# Patient Record
Sex: Female | Born: 1955
Health system: Southern US, Community
[De-identification: ages and names within clinical notes are randomized; demographics above are authoritative.]

## PROBLEM LIST (undated history)

## (undated) DIAGNOSIS — H8309 Labyrinthitis, unspecified ear: Secondary | ICD-10-CM

## (undated) DIAGNOSIS — I639 Cerebral infarction, unspecified: Secondary | ICD-10-CM

## (undated) DIAGNOSIS — Z8541 Personal history of malignant neoplasm of cervix uteri: Secondary | ICD-10-CM

## (undated) DIAGNOSIS — IMO0002 Reserved for concepts with insufficient information to code with codable children: Secondary | ICD-10-CM

## (undated) DIAGNOSIS — E785 Hyperlipidemia, unspecified: Secondary | ICD-10-CM

## (undated) DIAGNOSIS — R87629 Unspecified abnormal cytological findings in specimens from vagina: Secondary | ICD-10-CM

## (undated) DIAGNOSIS — I10 Essential (primary) hypertension: Secondary | ICD-10-CM

## (undated) DIAGNOSIS — R87619 Unspecified abnormal cytological findings in specimens from cervix uteri: Secondary | ICD-10-CM

## (undated) HISTORY — DX: Reserved for concepts with insufficient information to code with codable children: IMO0002

## (undated) HISTORY — DX: Labyrinthitis, unspecified ear: H83.09

## (undated) HISTORY — DX: Cerebral infarction, unspecified: I63.9

## (undated) HISTORY — DX: Personal history of malignant neoplasm of cervix uteri: Z85.41

## (undated) HISTORY — DX: Unspecified abnormal cytological findings in specimens from cervix uteri: R87.619

## (undated) HISTORY — PX: ABDOMINAL HYSTERECTOMY: SHX81

## (undated) HISTORY — DX: Hyperlipidemia, unspecified: E78.5

## (undated) HISTORY — DX: Unspecified abnormal cytological findings in specimens from vagina: R87.629

## (undated) HISTORY — DX: Essential (primary) hypertension: I10

---

## 2001-10-20 ENCOUNTER — Encounter: Payer: Self-pay | Admitting: Specialist

## 2001-10-20 ENCOUNTER — Ambulatory Visit (HOSPITAL_COMMUNITY): Admission: RE | Admit: 2001-10-20 | Discharge: 2001-10-20 | Payer: Self-pay | Admitting: Specialist

## 2002-10-24 ENCOUNTER — Ambulatory Visit (HOSPITAL_COMMUNITY): Admission: RE | Admit: 2002-10-24 | Discharge: 2002-10-24 | Payer: Self-pay | Admitting: Specialist

## 2002-10-24 ENCOUNTER — Encounter: Payer: Self-pay | Admitting: Specialist

## 2003-11-14 ENCOUNTER — Ambulatory Visit (HOSPITAL_COMMUNITY): Admission: RE | Admit: 2003-11-14 | Discharge: 2003-11-14 | Payer: Self-pay | Admitting: Specialist

## 2005-05-05 ENCOUNTER — Ambulatory Visit (HOSPITAL_COMMUNITY): Admission: RE | Admit: 2005-05-05 | Discharge: 2005-05-05 | Payer: Self-pay | Admitting: Specialist

## 2006-07-16 ENCOUNTER — Ambulatory Visit (HOSPITAL_COMMUNITY): Admission: RE | Admit: 2006-07-16 | Discharge: 2006-07-16 | Payer: Self-pay | Admitting: Specialist

## 2007-09-29 ENCOUNTER — Other Ambulatory Visit: Admission: RE | Admit: 2007-09-29 | Discharge: 2007-09-29 | Payer: Self-pay | Admitting: Obstetrics and Gynecology

## 2007-09-29 ENCOUNTER — Ambulatory Visit (HOSPITAL_COMMUNITY): Admission: RE | Admit: 2007-09-29 | Discharge: 2007-09-29 | Payer: Self-pay | Admitting: Obstetrics and Gynecology

## 2007-12-13 ENCOUNTER — Ambulatory Visit (HOSPITAL_COMMUNITY): Admission: RE | Admit: 2007-12-13 | Discharge: 2007-12-13 | Payer: Self-pay | Admitting: General Surgery

## 2008-09-12 ENCOUNTER — Ambulatory Visit: Payer: Self-pay | Admitting: Orthopedic Surgery

## 2008-09-12 DIAGNOSIS — M25579 Pain in unspecified ankle and joints of unspecified foot: Secondary | ICD-10-CM

## 2008-09-13 ENCOUNTER — Encounter: Payer: Self-pay | Admitting: Orthopedic Surgery

## 2008-10-04 ENCOUNTER — Other Ambulatory Visit: Admission: RE | Admit: 2008-10-04 | Discharge: 2008-10-04 | Payer: Self-pay | Admitting: Obstetrics and Gynecology

## 2008-10-05 ENCOUNTER — Ambulatory Visit (HOSPITAL_COMMUNITY): Admission: RE | Admit: 2008-10-05 | Discharge: 2008-10-05 | Payer: Self-pay | Admitting: Obstetrics and Gynecology

## 2008-10-05 IMAGING — MG MM DIGITAL SCREENING
4 series · 4 of 4 positions shown · non-contrast
Comparison: none

DG SCREEN MAMMOGRAM BILATERAL
Bilateral CC and MLO view(s) were taken.

DIGITAL SCREENING MAMMOGRAM WITH CAD:
There are scattered fibroglandular densities.  No masses or malignant type calcifications are 
identified.  Compared with prior studies.

[L CC]
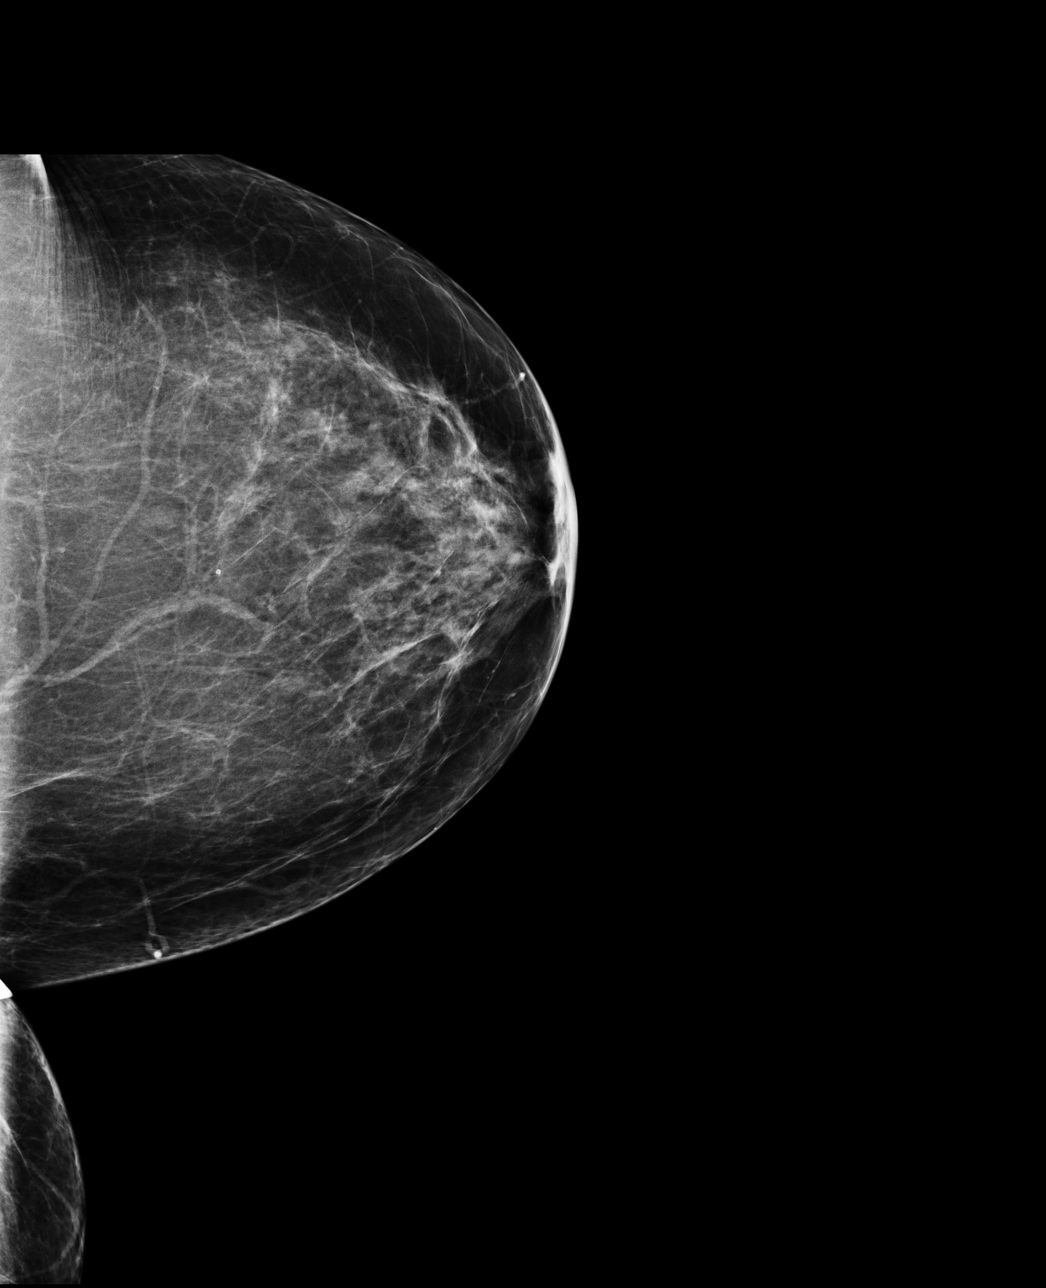

[L MLO]
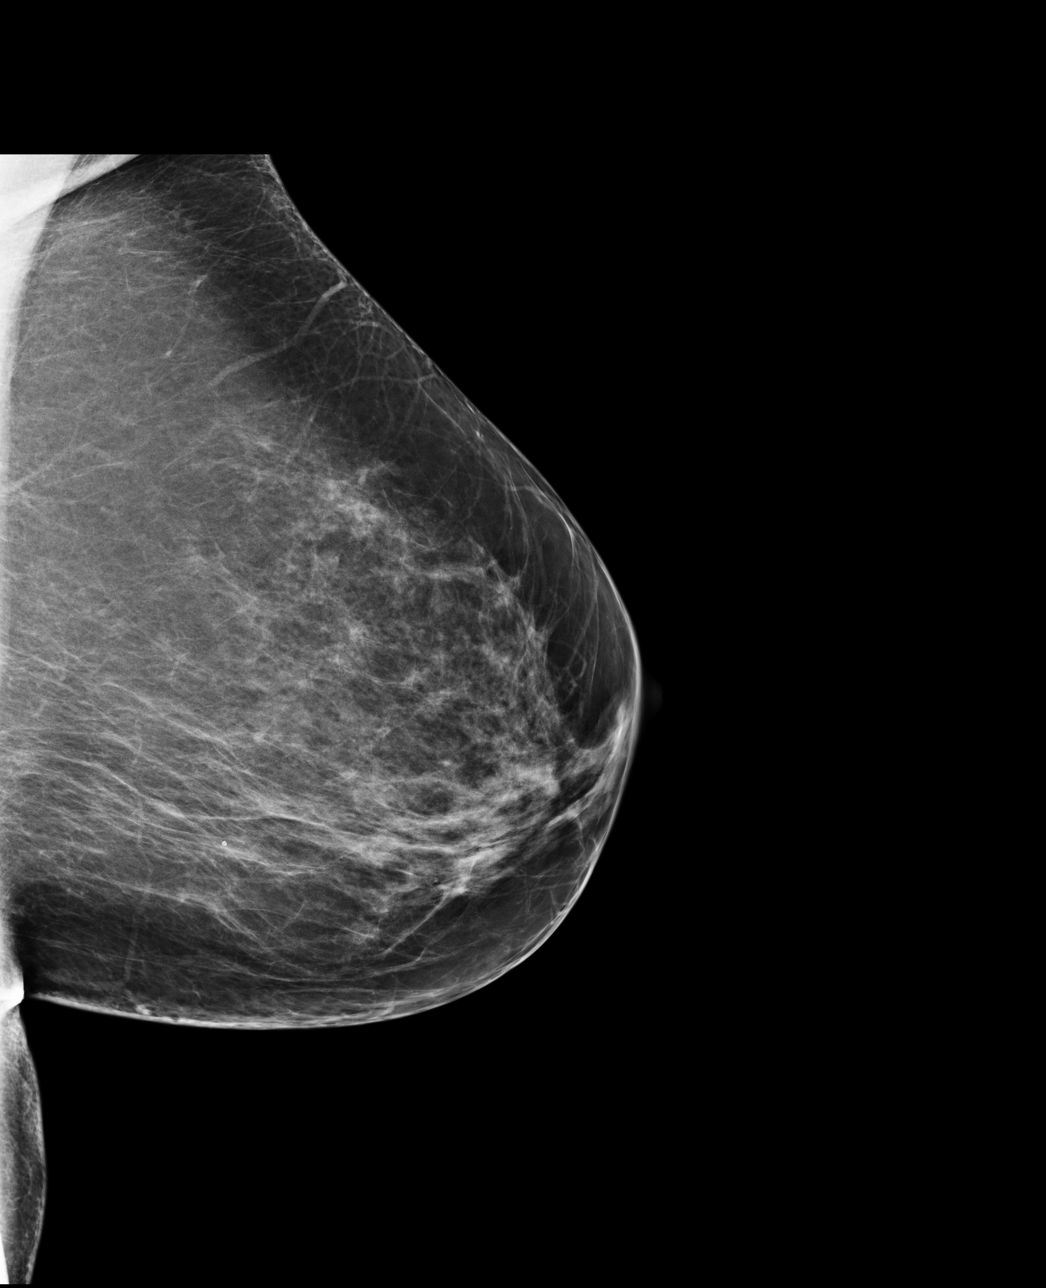

[R CC]
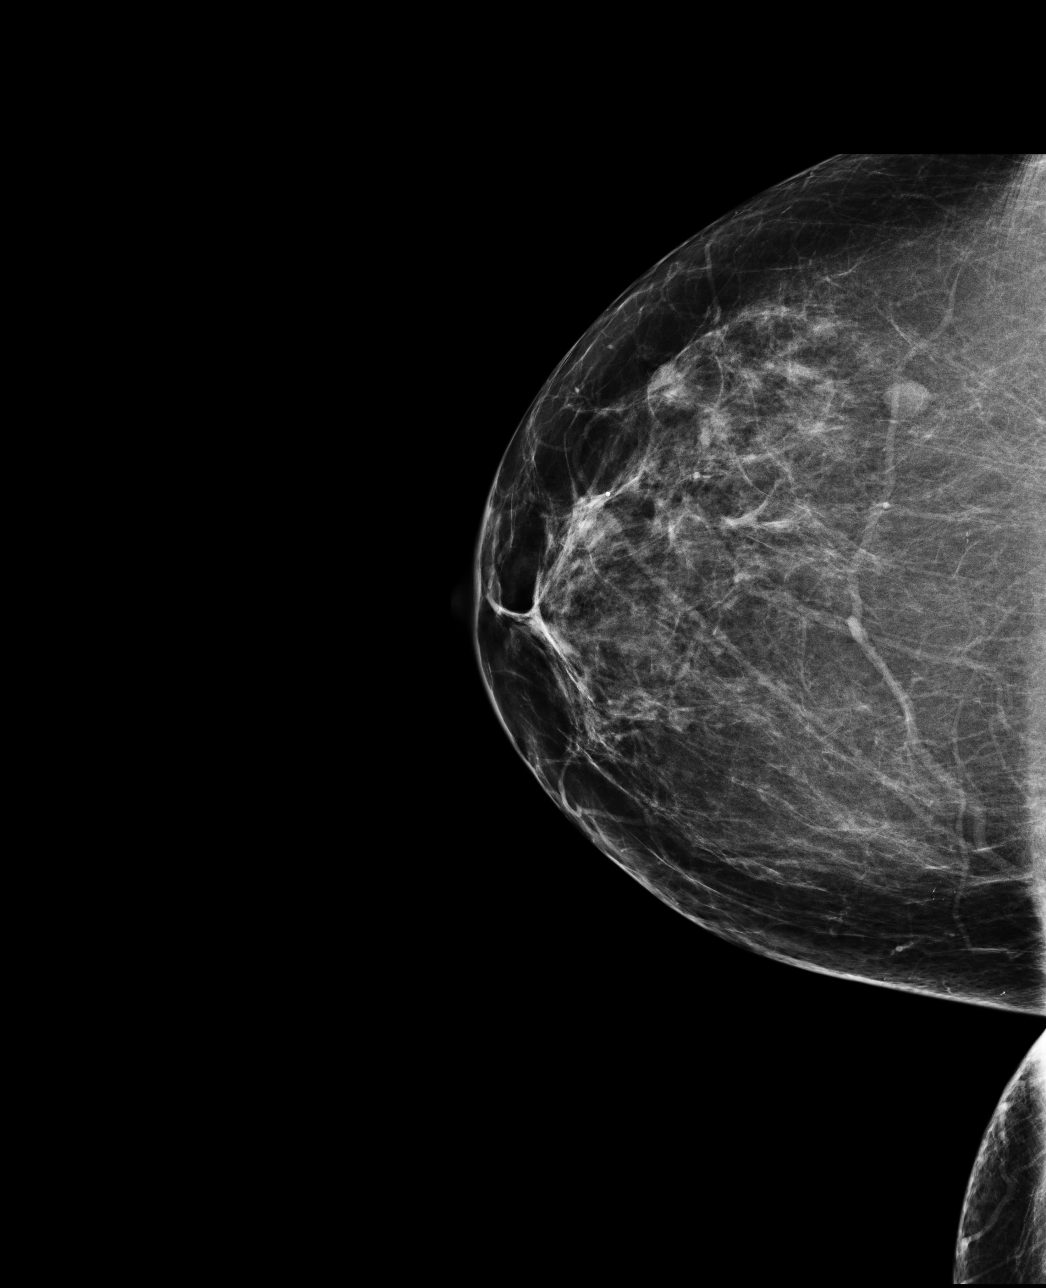

[R MLO]
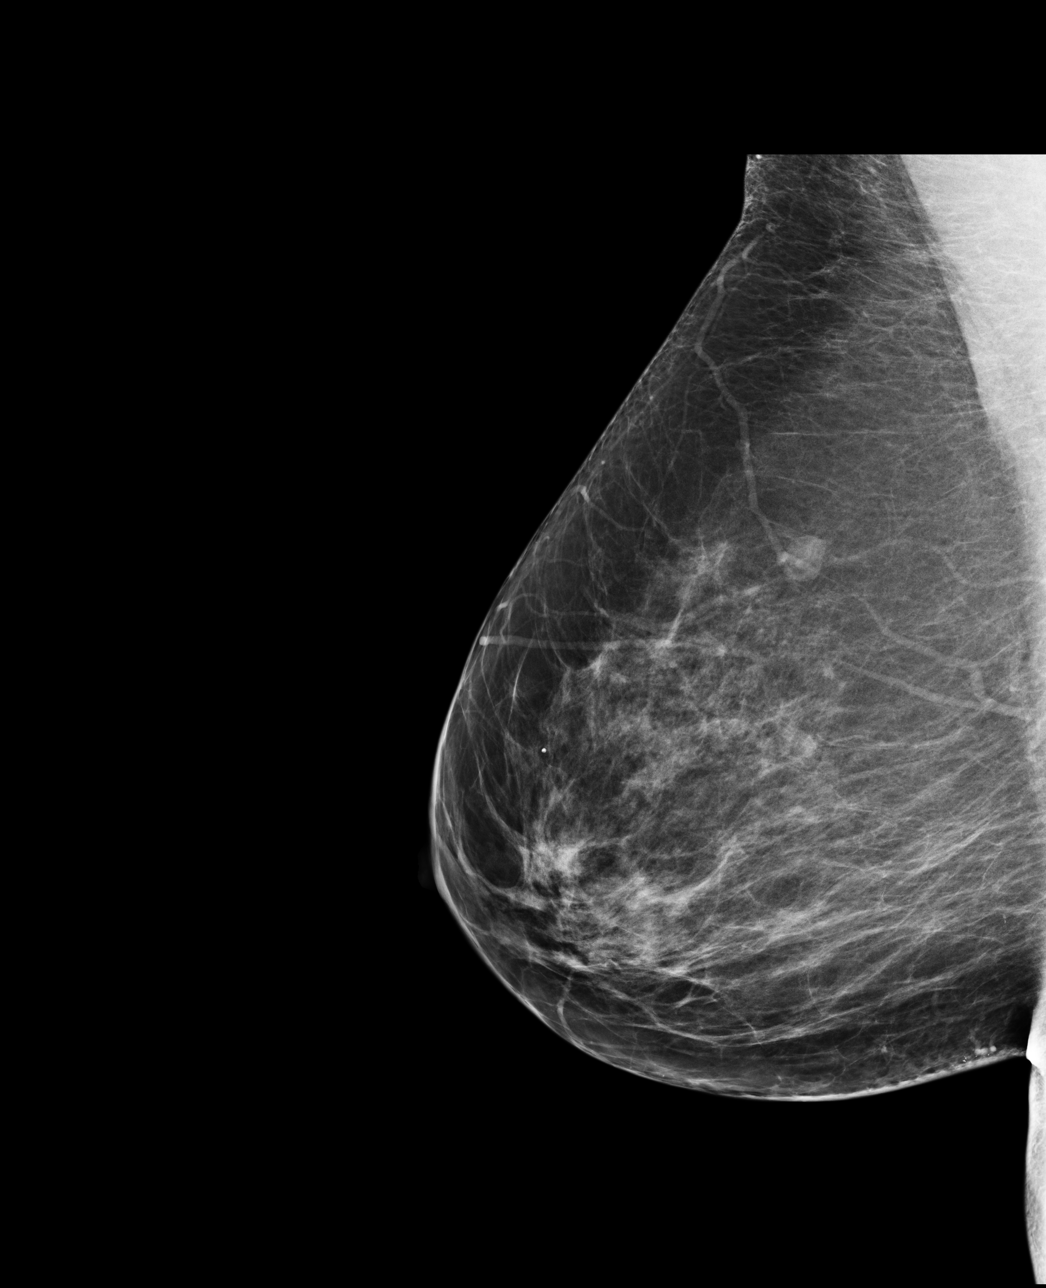

[4 of 4 positions shown; findings below may reference images not displayed]

IMPRESSION: No specific mammographic evidence of malignancy.  Next screening mammogram is recommended in one 
year.

A result letter of this screening mammogram will be mailed directly to the patient.

ASSESSMENT: Negative - BI-RADS 1

Screening mammogram in 1 year.
THIS WAS ANALAYZED BY COMPUTER AIDED DETECTION. , THIS PROCEDURE WAS A DIGITAL MAMMOGRAM.

## 2009-11-22 ENCOUNTER — Ambulatory Visit (HOSPITAL_COMMUNITY): Admission: RE | Admit: 2009-11-22 | Discharge: 2009-11-22 | Payer: Self-pay | Admitting: Obstetrics & Gynecology

## 2009-11-22 ENCOUNTER — Other Ambulatory Visit: Admission: RE | Admit: 2009-11-22 | Discharge: 2009-11-22 | Payer: Self-pay | Admitting: Obstetrics and Gynecology

## 2009-11-22 IMAGING — MG MM DIGITAL SCREENING
5 series · 5 of 5 positions shown · non-contrast
Comparison: none

DG SCREEN MAMMOGRAM BILATERAL
Bilateral CC and MLO view(s) were taken.
Technologist: [REDACTED]

DIGITAL SCREENING MAMMOGRAM WITH CAD:
There are scattered fibroglandular densities.  No masses or malignant type calcifications are 
identified.  Compared with prior studies.
Images were processed with CAD.

[L CC (1 of 2)]
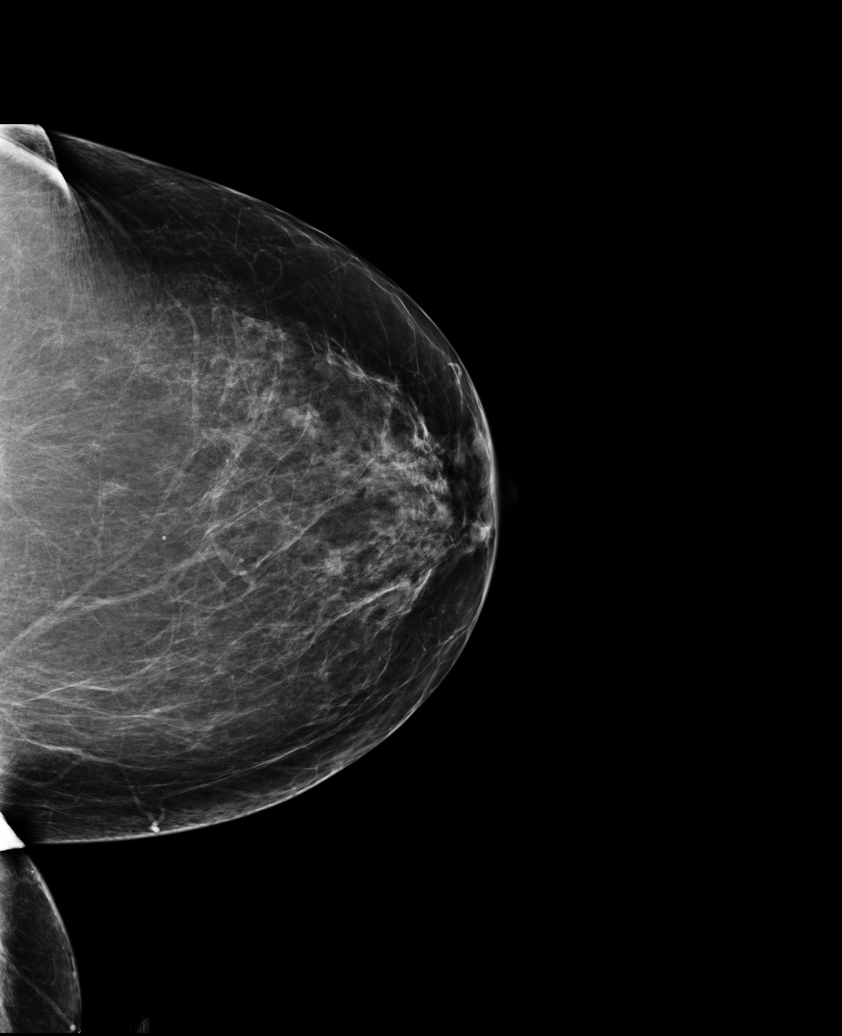

[L MLO]
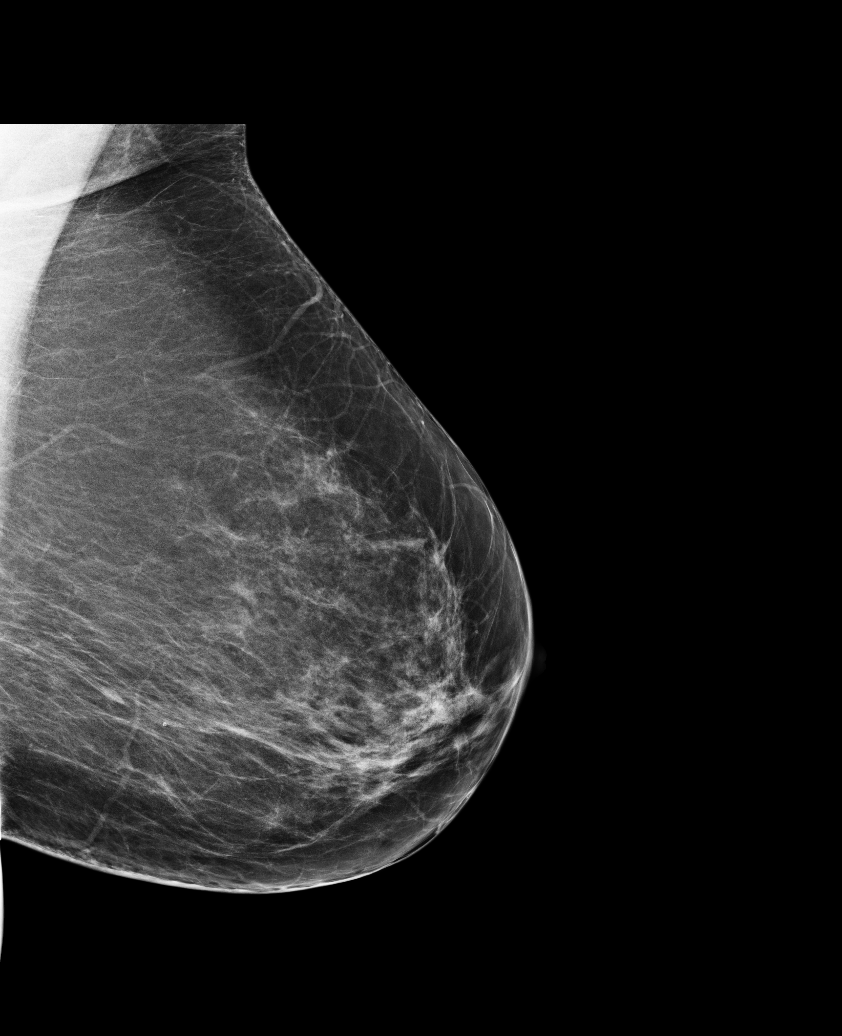

[R CC]
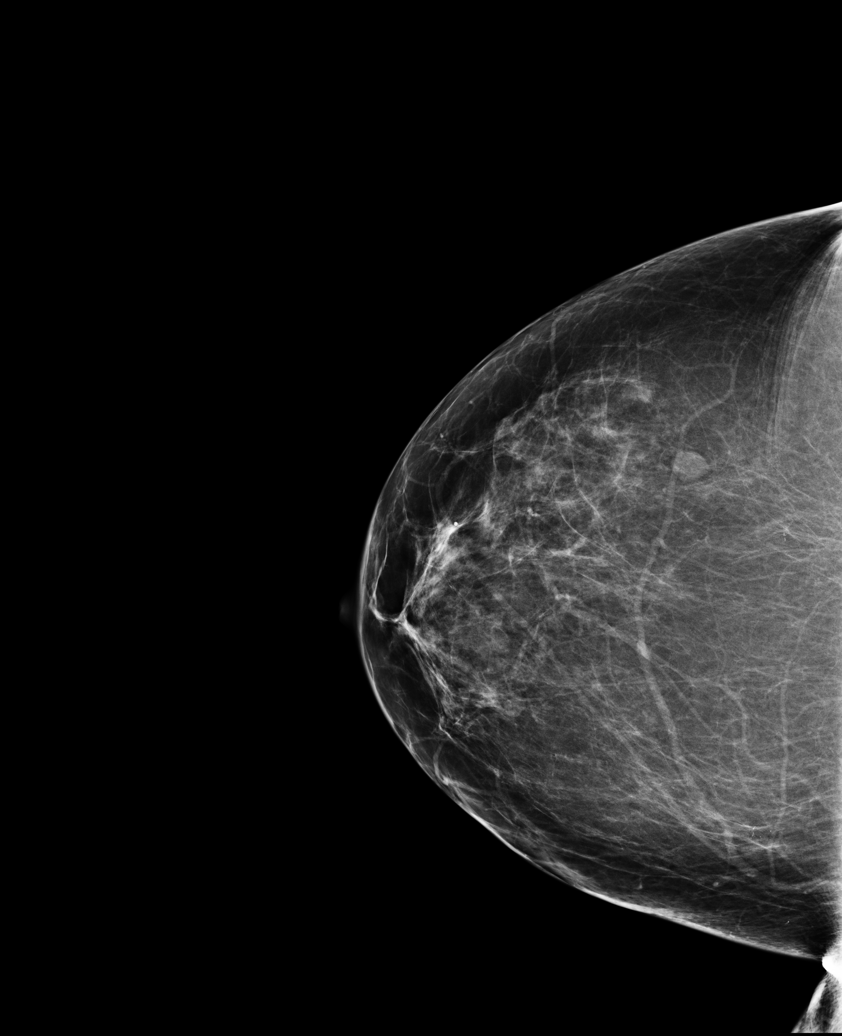

[R MLO]
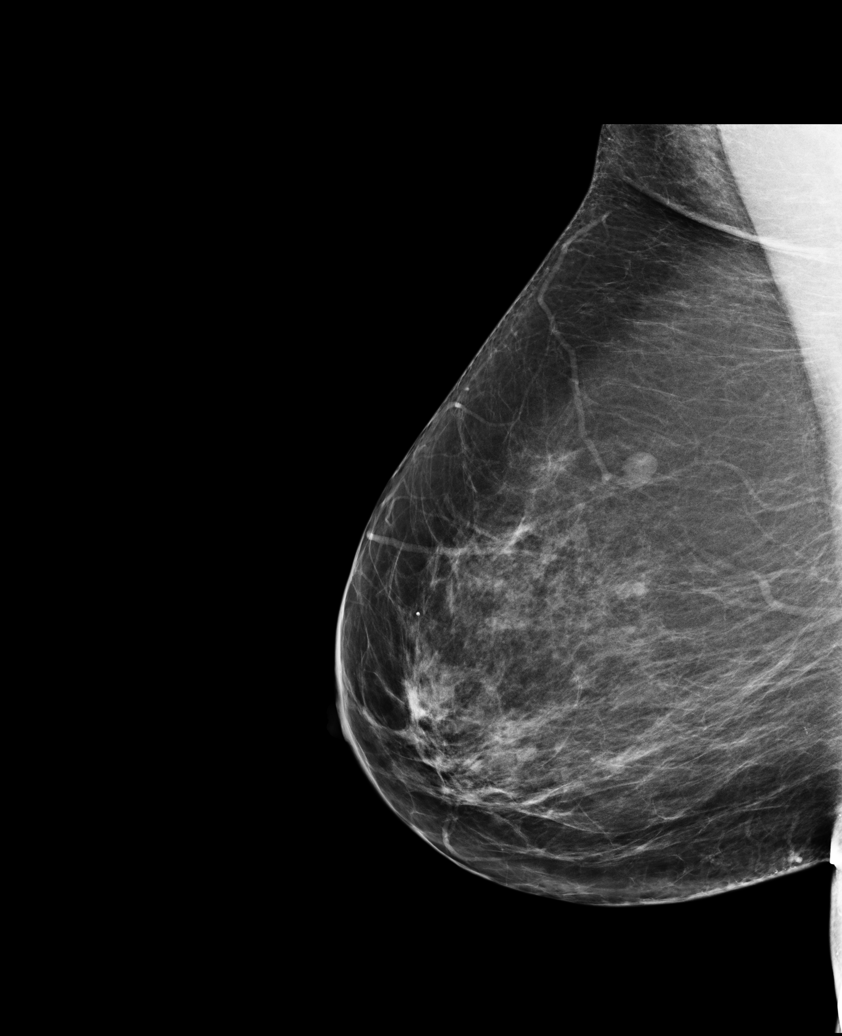

[L CC (2 of 2)]
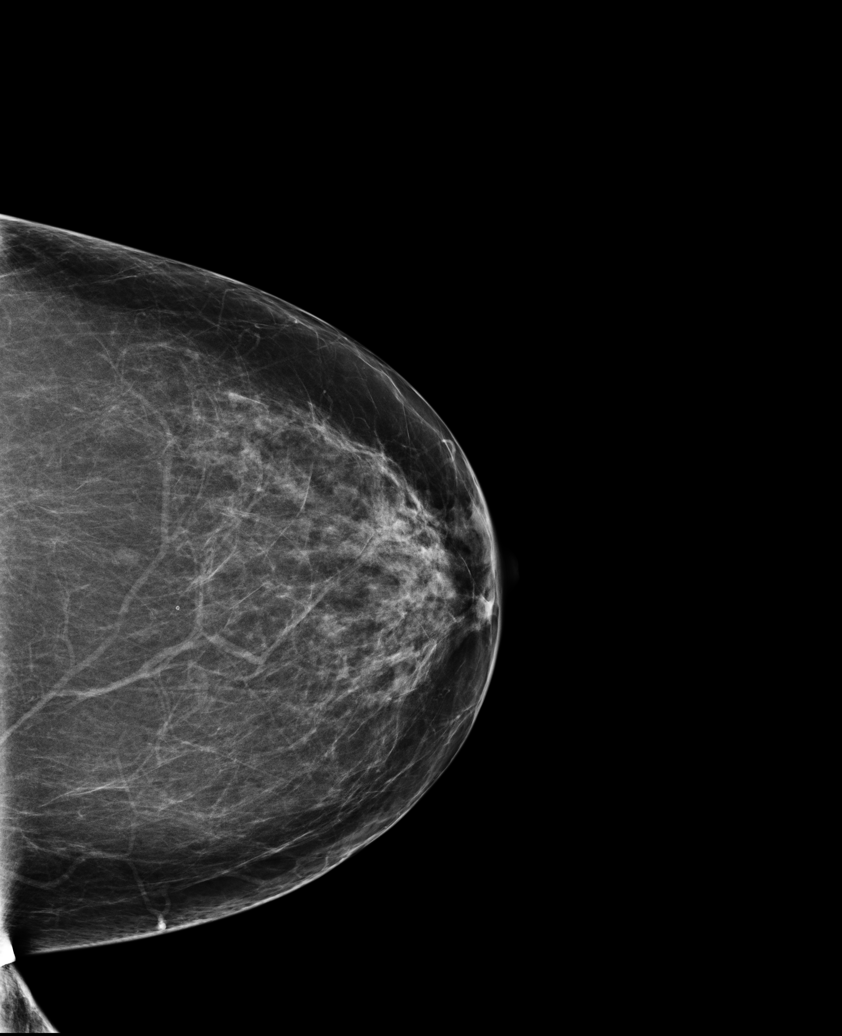

[5 of 5 positions shown; findings below may reference images not displayed]

IMPRESSION: No specific mammographic evidence of malignancy.  Next screening mammogram is recommended in one 
year.

A result letter of this screening mammogram will be mailed directly to the patient.

ASSESSMENT: Negative - BI-RADS 1

Screening mammogram in 1 year.
,

## 2010-07-22 ENCOUNTER — Ambulatory Visit: Payer: Self-pay | Admitting: Orthopedic Surgery

## 2010-09-04 NOTE — Assessment & Plan Note (Signed)
Summary: rt knee pain needs xr/uhc/bsf   Visit Type:  new problem  CC:  right knee pain.  History of Present Illness: I saw Jacqueline Leon in the office today for an initial visit.  She is a 55 years old woman with the complaint of:  right knee pain  She has had swelling in the RIGHT knee with pain for the last 2 weeks increased with knee flexion described as dull throbbing occasionally relieved by Aleve medication.  Pain intensity is 5/10.  Swelling tends to recur the end of the day.  No trauma.  She's been rubbing it with BenGay.  She has start up pain and stiffness.  Medications: D3 5000 International Units, Simvastatin 20 mg, Meclizine 25 mg.  Xrays today.  Allergies: No Known Drug Allergies  Past History:  Past Medical History: Last updated: 09/12/2008 cholesterol inner ear  Past Surgical History: Last updated: 09/12/2008 partial hysterectomy  Family History: Last updated: 09/12/2008 FH of Cancer:   Social History: Last updated: 09/12/2008 Patient is married.  textiles  Risk Factors: Caffeine Use: 0 (09/12/2008)  Risk Factors: Smoking Status: never (09/12/2008)  Family History: Reviewed history from 09/12/2008 and no changes required. FH of Cancer:   Social History: Reviewed history from 09/12/2008 and no changes required. Patient is married.  textiles  Review of Systems Constitutional:  Complains of weight gain; denies weight loss, fever, chills, and fatigue. Cardiovascular:  Denies chest pain, palpitations, fainting, and murmurs. Respiratory:  Denies short of breath, wheezing, couch, tightness, pain on inspiration, and snoring . Gastrointestinal:  Denies heartburn, nausea, vomiting, diarrhea, constipation, and blood in your stools. Genitourinary:  Denies frequency, urgency, difficulty urinating, painful urination, flank pain, and bleeding in urine. Neurologic:  Denies numbness, tingling, unsteady gait, dizziness, tremors, and seizure. Musculoskeletal:   Denies joint pain, swelling, instability, stiffness, redness, heat, and muscle pain. Endocrine:  Denies excessive thirst, exessive urination, and heat or cold intolerance. Psychiatric:  Denies nervousness, depression, anxiety, and hallucinations. Skin:  Denies changes in the skin, poor healing, rash, itching, and redness. HEENT:  Denies blurred or double vision, eye pain, redness, and watering. Immunology:  Denies seasonal allergies, sinus problems, and allergic to bee stings. Hemoatologic:  Denies easy bleeding and brusing.   Patient Instructions: 1)  This is most likely a cartilage tear  2)  You have received an injection of cortisone today. You may experience increased pain at the injection site. Apply ice pack to the area for 20 minutes every 2 hours and take 2 xtra strength tylenol every 8 hours. This increased pain will usually resolve in 24 hours. The injection will take effect in 3-10 days.  3)  Aleve two times a day  4)  f/u 6 weeks   Appended Document: rt knee pain needs xr/uhc/bsf Physical Exam  General:  Well developed, well nourished, normal body habitus; no deformities, normal grooming. Msk:  her LEFT knee examination reveals no specific abnormalities.  Inspection was normal.  Range of motion was full.  Strength assessed 5 over 5.  Knee was stable.  Her RIGHT knee with painful at terminal flexion had no effusion he did have significant medial joint line tenderness.  Full extension was noted.  Strength is graded 5 over 5 in her knee was stable  Pocket of tests of McMurray's were positive. Pulses:  The pulses and perfusion were normal with normal color, temperature  and no swelling  Extremities:   The upper extremities have normal appearance, ROM, strength and stability.  Neurologic:  The  coordination and sensation were normal  The reflexes were normal   Skin:  intact without lesions or rashes Inguinal Nodes:  no significant adenopathy Psych:  alert and cooperative;  normal mood and affect; normal attention span and concentration   Impression & Recommendations:  Problem # 1:  DERANGEMENT OF POSTERIOR HORN OF MEDIAL MENISCUS (ICD-717.2) Assessment New  3 views RIGHT knee  Overall the knee looks well aligned maybe a little varus, the joint spaces appear to be preserved there were no bone lesions  Impression normal RIGHT knee  Overall impression probable torn medial meniscus  I offered the patient injection vs. surgical treatment  Patient opted for nonsurgical treatment so we gave her an injection in the RIGHT knee  Verbal consent was obtained. The RIGHT knee was prepped with alcohol and ethyl chloride. 1 cc of depomedrol 40mg /cc and 4 cc of lidocaine 1% was injected. there were no complications.  Orders: New Patient Level III (04540) Knee x-ray,  3 views (98119) Joint Aspirate / Injection, Large (20610) Depo- Medrol 40mg  (J1030)  Patient Instructions: 1)  MRI [facility] follow up with results   Orders Added: 1)  New Patient Level III [14782] 2)  Knee x-ray,  3 views [73562] 3)  Joint Aspirate / Injection, Large [20610] 4)  Depo- Medrol 40mg  [J1030]    Signed by Fuller Canada MD on 07/23/2010 at 7:59 AM

## 2010-09-04 NOTE — Letter (Signed)
Summary: History form  History form   Imported By: Jacklynn Ganong 07/30/2010 15:42:45  _____________________________________________________________________  External Attachment:    Type:   Image     Comment:   External Document

## 2010-09-09 ENCOUNTER — Encounter: Payer: Self-pay | Admitting: Orthopedic Surgery

## 2010-09-09 ENCOUNTER — Ambulatory Visit (INDEPENDENT_AMBULATORY_CARE_PROVIDER_SITE_OTHER): Payer: 59 | Admitting: Orthopedic Surgery

## 2010-09-09 DIAGNOSIS — M23329 Other meniscus derangements, posterior horn of medial meniscus, unspecified knee: Secondary | ICD-10-CM

## 2010-09-09 DIAGNOSIS — M1710 Unilateral primary osteoarthritis, unspecified knee: Secondary | ICD-10-CM

## 2010-09-18 NOTE — Assessment & Plan Note (Signed)
Summary: 6 wk folup rt knee ?xray/uhc/wkj   Visit Type:  Follow-up  CC:  right knee pain.  History of Present Illness: I saw Renad Scioneaux in the office today for a 6 week  followup visit.  She is a 55 years old woman with the complaint of:  right knee  DX: POSSIBLE MEDIAL MENISCUS TEAR VS. OA   Medications: D3 5000 International Units, Simvastatin 20 mg, Meclizine 25 mg, Aleve.  Treatment: Injection on last visit and Aleve.  Complaints: She states that her knee feels alot better.       Allergies: No Known Drug Allergies  Physical Exam  Skin:  intact without lesions or rashes Psych:  alert and cooperative; normal mood and affect; normal attention span and concentration   Knee Exam  Knee Exam:    Right:    Inspection:  Normal    Palpation:  Normal    Stability:  stable    Tenderness:  no    Swelling:  no    Erythema:  no    MCMURRAYS IS NEGATIVE     Range of Motion:       Flexion-Active: full       Extension-Active: full       Flexion-Passive: full       Extension-Passive: full    Left:    Inspection:  Normal    Palpation:  Normal    Stability:  stable    Tenderness:  no    Swelling:  no    Erythema:  no    Range of Motion:       Flexion-Active: full       Extension-Active: full       Flexion-Passive: full       Extension-Passive: full   Impression & Recommendations:  Problem # 1:  DERANGEMENT OF POSTERIOR HORN OF MEDIAL MENISCUS (ICD-717.2) Assessment Improved  Orders: Est. Patient Level II (16010)  Problem # 2:  DEGENERATIVE JOINT DISEASE, RIGHT KNEE (ICD-715.96) Assessment: Improved  Orders: Est. Patient Level II (93235)  Patient Instructions: 1)  CONTINUE ICE AND ALEVE AS NEEDED AND CALL ME IF THINGS WORSEN AGAIN    Orders Added: 1)  Est. Patient Level II [57322]

## 2010-12-10 ENCOUNTER — Other Ambulatory Visit: Payer: Self-pay | Admitting: Obstetrics and Gynecology

## 2010-12-10 DIAGNOSIS — Z139 Encounter for screening, unspecified: Secondary | ICD-10-CM

## 2010-12-16 NOTE — H&P (Signed)
NAME:  Jacqueline Leon, KHURANA                ACCOUNT NO.:  [id number]   MEDICAL RECORD NO.:  [id number]          PATIENT TYPE:  AMB   LOCATION:  DAY                           FACILITY:  [facility]   PHYSICIAN:  Dalia Heading, M.D.  DATE OF BIRTH:  [birth year]   DATE OF ADMISSION:  DATE OF DISCHARGE:  LH                              HISTORY & PHYSICAL   CHIEF COMPLAINT:  Need for screening colonoscopy.   HISTORY OF PRESENT ILLNESS:  The patient is a 55 year old black female  who is referred for endoscopic evaluation.  She needs a colonoscopy for  screening purposes.  No abdominal pain, weight loss, nausea, vomiting,  diarrhea, constipation, melena, or hematochezia have been noted.  She  has never had a colonoscopy.  There is no family history of colon  carcinoma.   PAST MEDICAL HISTORY:  Includes high cholesterol levels.   PAST SURGICAL HISTORY:  Partial hysterectomy.   CURRENT MEDICATIONS:  Cholesterol medications.   ALLERGIES:  No known drug allergies.   REVIEW OF SYSTEMS:  The patient does smoke tobacco daily.  She denies  any significant alcohol use.  She denies any other cardiopulmonary  difficulties or bleeding disorders.   PHYSICAL EXAMINATION:  GENERAL:  The patient is a well-developed, well-  nourished black female in no acute distress.  LUNGS:  Clear to auscultation with equal breath sounds bilaterally.  HEART:  Examination reveals a regular rate and rhythm without S3-S4, or  murmurs.  ABDOMEN:  Soft, nontender, nondistended.  No hepatosplenomegaly or  masses are noted.  RECTAL:  Examination was deferred to the procedure.   IMPRESSION:  Need for screening colonoscopy.   PLAN:  The patient is scheduled for a colonoscopy on November 08, 2007.  The  risks and benefits of the procedure; including bleeding and perforation  were fully explained to the patient, who gave informed consent.      Dalia Heading, M.D.     MAJ/MEDQ  D:  10/18/2007  T:  10/19/2007  Job:  [id number]   cc:    [facility]  Fax: [phone]   Patrica Duel, M.D.  Fax: [phone]   Tilda Burrow, M.D.  Fax: [phone]

## 2010-12-16 NOTE — H&P (Signed)
NAME:  Jacqueline Leon, Jacqueline Leon                ACCOUNT NO.:  [id number]   MEDICAL RECORD NO.:  [id number]          PATIENT TYPE:  AMB   LOCATION:  DAY                           FACILITY:  [facility]   PHYSICIAN:  Dalia Heading, M.D.  DATE OF BIRTH:  Nov 22, 1955   DATE OF ADMISSION:  DATE OF DISCHARGE:  LH                              HISTORY & PHYSICAL   CHIEF COMPLAINT:  Need for screening colonoscopy.   HISTORY OF PRESENT ILLNESS:  The patient is a 55 year old black female  who is referred for endoscopic evaluation.  She needs a colonoscopy for  screening purposes.  No abdominal pain, weight loss, nausea, vomiting,  diarrhea, constipation, melena, or hematochezia have been noted.  She  has never had a colonoscopy.  There is no family history of colon  carcinoma.   PAST MEDICAL HISTORY:  Includes high cholesterol levels.   PAST SURGICAL HISTORY:  Partial hysterectomy.   CURRENT MEDICATIONS:  Cholesterol medications.   ALLERGIES:  No known drug allergies.   REVIEW OF SYSTEMS:  The patient does smoke tobacco daily.  She denies  any significant alcohol use.  She denies any other cardiopulmonary  difficulties or bleeding disorders.   PHYSICAL EXAMINATION:  GENERAL:  The patient is a well-developed, well-  nourished black female in no acute distress.  LUNGS:  Clear to auscultation with equal breath sounds bilaterally.  HEART:  Examination reveals a regular rate and rhythm without S3-S4, or  murmurs.  ABDOMEN:  Soft, nontender, nondistended.  No hepatosplenomegaly or  masses are noted.  RECTAL:  Examination was deferred to the procedure.   IMPRESSION:  Need for screening colonoscopy.   PLAN:  The patient is scheduled for a colonoscopy on November 08, 2007.  The  risks and benefits of the procedure; including bleeding and perforation  were fully explained to the patient, who gave informed consent.      Dalia Heading, M.D.  Electronically Signed     MAJ/MEDQ  D:  10/18/2007  T:   10/19/2007  Job:  [id number]   cc:   [facility]  Fax: [phone]   Patrica Duel, M.D.  Fax: [phone]   Tilda Burrow, M.D.  Fax: [phone]

## 2011-01-26 ENCOUNTER — Ambulatory Visit (HOSPITAL_COMMUNITY)
Admission: RE | Admit: 2011-01-26 | Discharge: 2011-01-26 | Disposition: A | Discharge status: Home / Self Care | Payer: 59 | Source: Ambulatory Visit | Attending: Obstetrics and Gynecology | Admitting: Obstetrics and Gynecology

## 2011-01-26 ENCOUNTER — Other Ambulatory Visit: Payer: Self-pay | Admitting: Adult Health

## 2011-01-26 ENCOUNTER — Other Ambulatory Visit (HOSPITAL_COMMUNITY)
Admission: RE | Admit: 2011-01-26 | Discharge: 2011-01-26 | Disposition: A | Discharge status: Home / Self Care | Payer: 59 | Source: Ambulatory Visit | Attending: Obstetrics and Gynecology | Admitting: Obstetrics and Gynecology

## 2011-01-26 DIAGNOSIS — Z1231 Encounter for screening mammogram for malignant neoplasm of breast: Secondary | ICD-10-CM | POA: Insufficient documentation

## 2011-01-26 DIAGNOSIS — Z01419 Encounter for gynecological examination (general) (routine) without abnormal findings: Secondary | ICD-10-CM | POA: Insufficient documentation

## 2011-01-26 DIAGNOSIS — Z139 Encounter for screening, unspecified: Secondary | ICD-10-CM

## 2012-01-13 ENCOUNTER — Other Ambulatory Visit: Payer: Self-pay | Admitting: Adult Health

## 2012-01-13 DIAGNOSIS — Z139 Encounter for screening, unspecified: Secondary | ICD-10-CM

## 2012-03-03 ENCOUNTER — Ambulatory Visit (HOSPITAL_COMMUNITY): Payer: 59

## 2012-03-28 ENCOUNTER — Ambulatory Visit (HOSPITAL_COMMUNITY)
Admission: RE | Admit: 2012-03-28 | Discharge: 2012-03-28 | Disposition: A | Discharge status: Home / Self Care | Payer: 59 | Source: Ambulatory Visit | Attending: Adult Health | Admitting: Adult Health

## 2012-03-28 ENCOUNTER — Other Ambulatory Visit (HOSPITAL_COMMUNITY)
Admission: RE | Admit: 2012-03-28 | Discharge: 2012-03-28 | Disposition: A | Discharge status: Home / Self Care | Payer: 59 | Source: Ambulatory Visit | Attending: Obstetrics and Gynecology | Admitting: Obstetrics and Gynecology

## 2012-03-28 ENCOUNTER — Other Ambulatory Visit: Payer: Self-pay | Admitting: Adult Health

## 2012-03-28 DIAGNOSIS — Z01419 Encounter for gynecological examination (general) (routine) without abnormal findings: Secondary | ICD-10-CM | POA: Insufficient documentation

## 2012-03-28 DIAGNOSIS — Z139 Encounter for screening, unspecified: Secondary | ICD-10-CM

## 2012-03-28 DIAGNOSIS — Z1151 Encounter for screening for human papillomavirus (HPV): Secondary | ICD-10-CM | POA: Insufficient documentation

## 2012-03-28 DIAGNOSIS — Z1231 Encounter for screening mammogram for malignant neoplasm of breast: Secondary | ICD-10-CM | POA: Insufficient documentation

## 2012-03-28 IMAGING — MG MM DIGITAL SCREENING BILAT
4 series · 4 of 4 positions shown · non-contrast
Comparison: Previous exams.

CLINICAL DATA: Screening.

DIGITAL BILATERAL SCREENING MAMMOGRAM WITH CAD

[L CC]
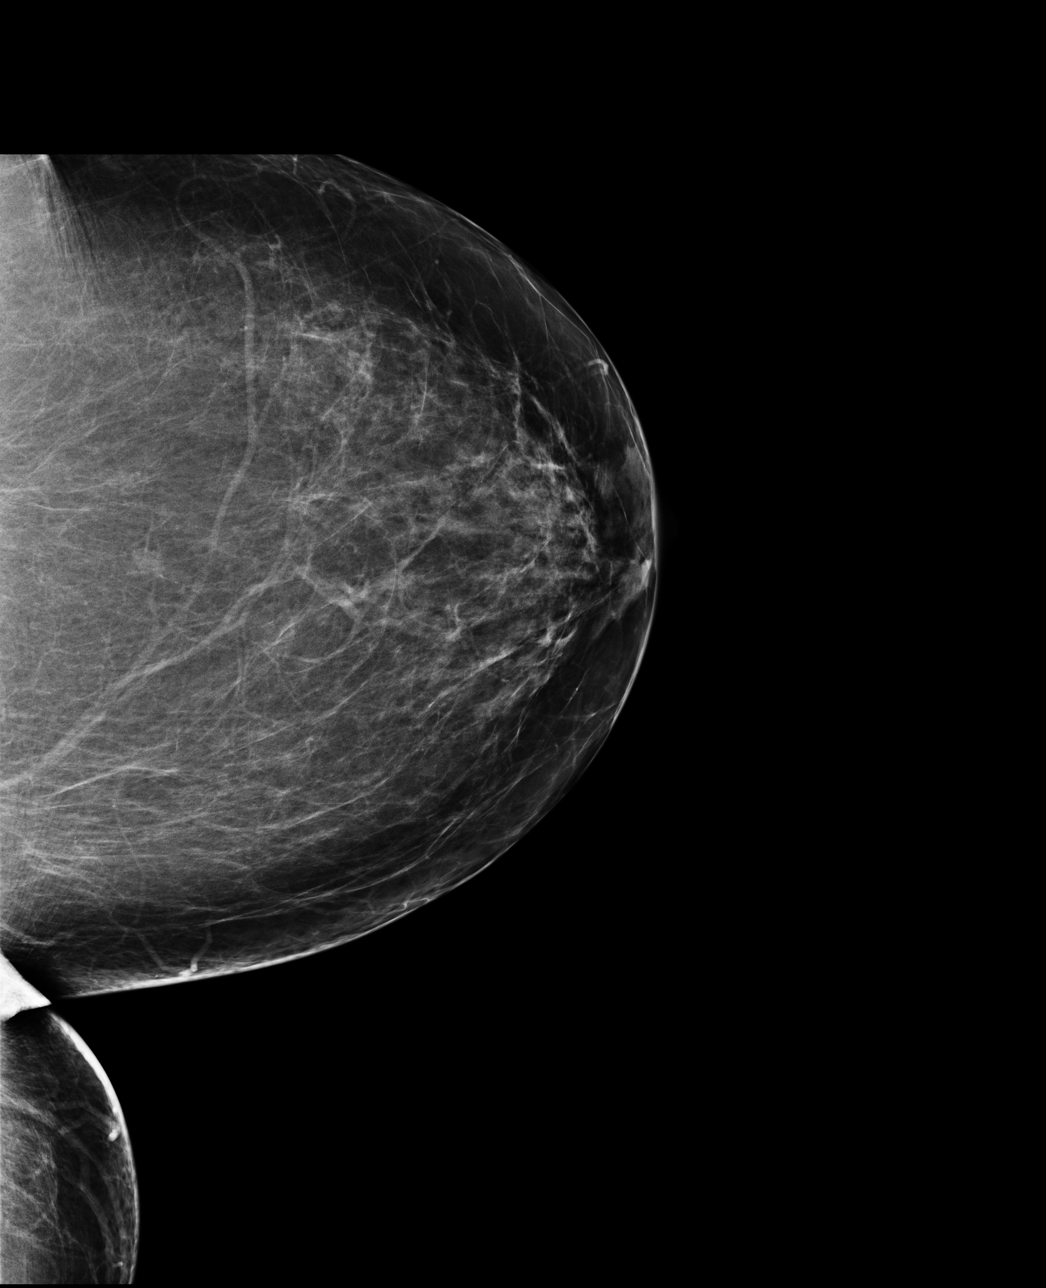

[L MLO]
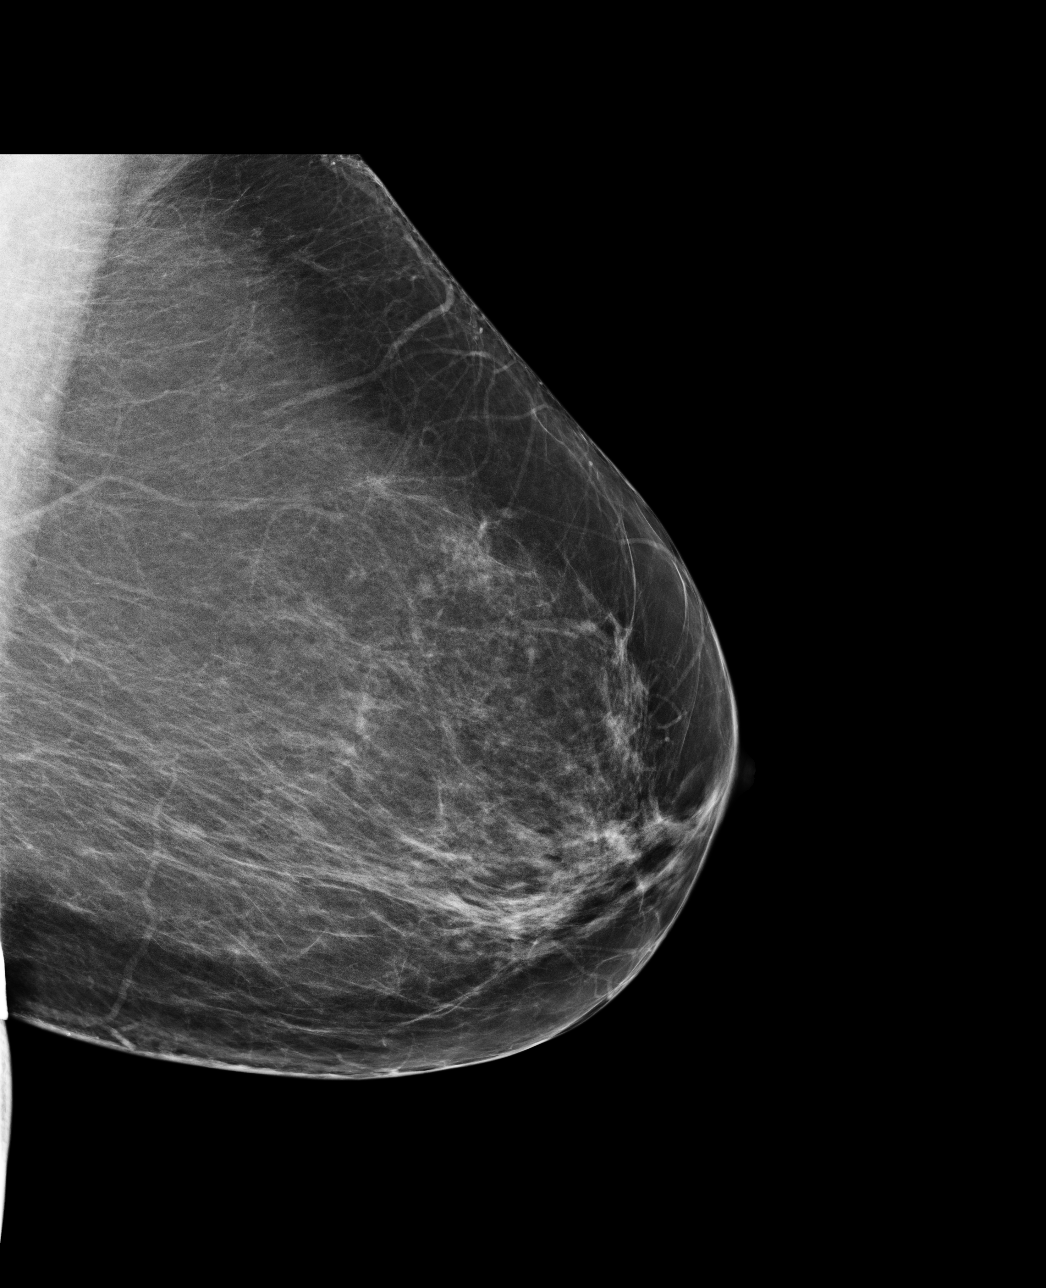

[R CC]
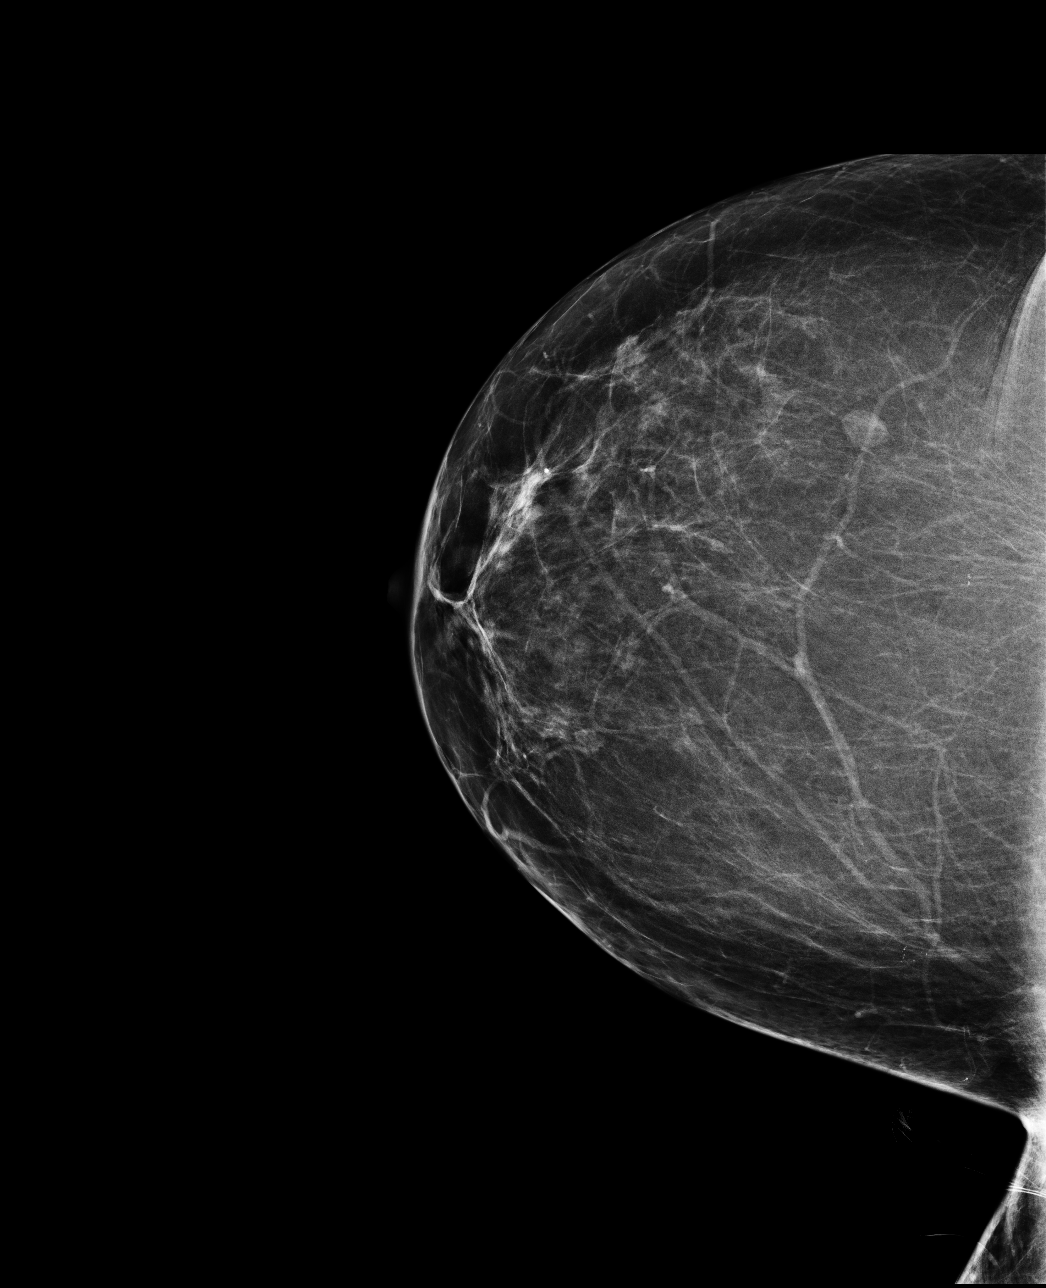

[R MLO]
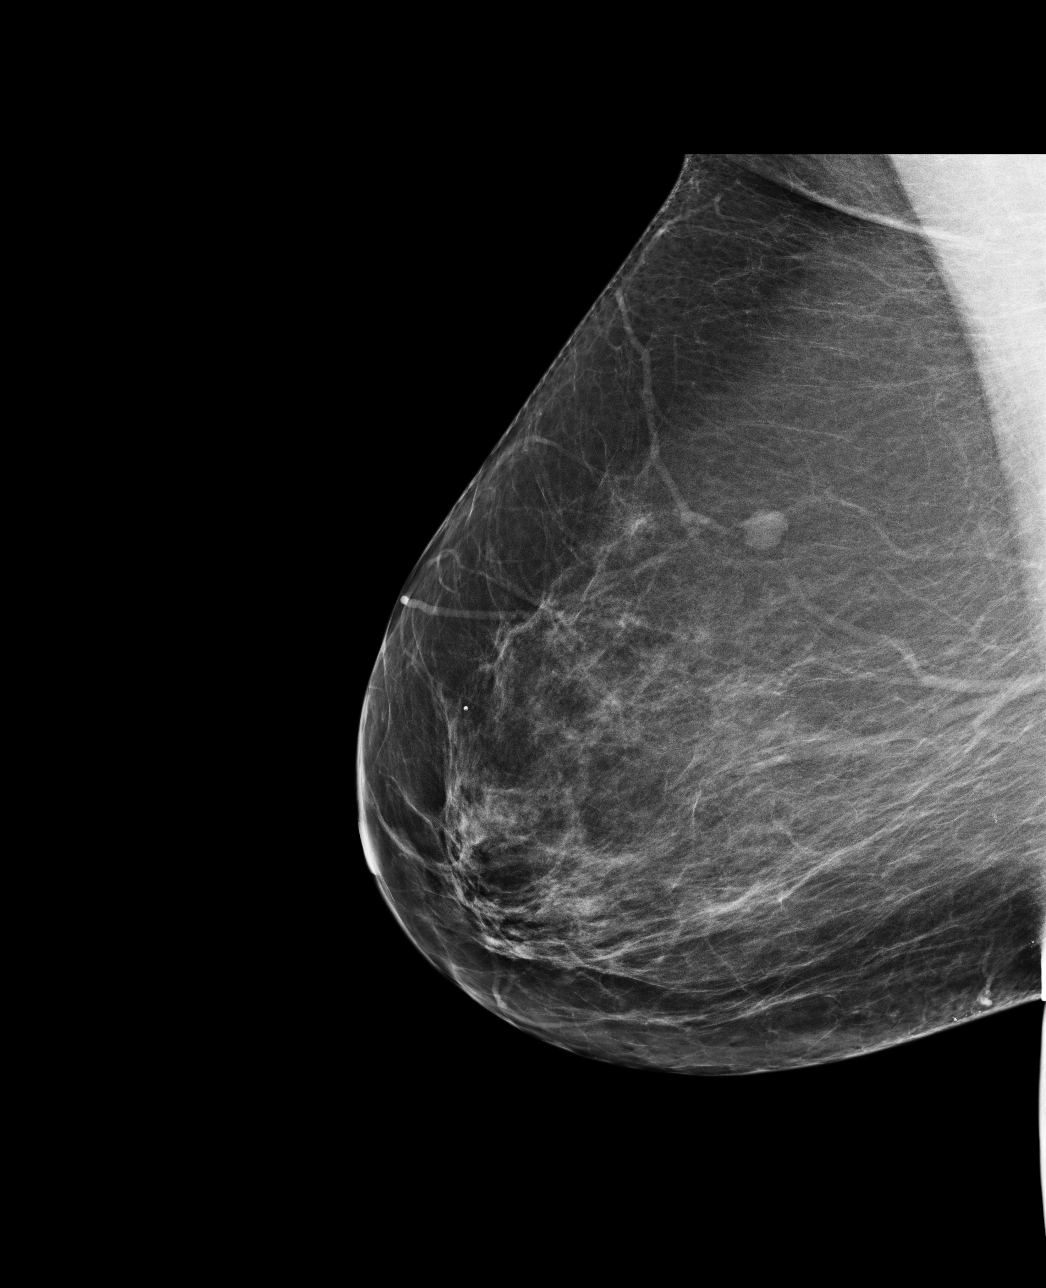

[4 of 4 positions shown; findings below may reference images not displayed]

FINDINGS: There are scattered fibroglandular densities. No
suspicious masses, architectural distortion, or calcifications are
present.

Images were processed with CAD.
IMPRESSION: No mammographic evidence of malignancy.

A result letter of this screening mammogram will be mailed directly
to the patient.

RECOMMENDATION:
Screening mammogram in one year. (Code:[XG])

BI-RADS CATEGORY 1:  Negative.

## 2013-03-23 ENCOUNTER — Other Ambulatory Visit: Payer: Self-pay | Admitting: Adult Health

## 2013-03-23 DIAGNOSIS — Z139 Encounter for screening, unspecified: Secondary | ICD-10-CM

## 2013-04-10 ENCOUNTER — Other Ambulatory Visit: Payer: Self-pay | Admitting: Adult Health

## 2013-05-19 ENCOUNTER — Ambulatory Visit (HOSPITAL_COMMUNITY): Payer: 59

## 2013-05-19 ENCOUNTER — Other Ambulatory Visit: Payer: Self-pay | Admitting: Adult Health

## 2013-06-02 ENCOUNTER — Other Ambulatory Visit (HOSPITAL_COMMUNITY)
Admission: RE | Admit: 2013-06-02 | Discharge: 2013-06-02 | Disposition: A | Discharge status: Home / Self Care | Payer: 59 | Source: Ambulatory Visit | Attending: Adult Health | Admitting: Adult Health

## 2013-06-02 ENCOUNTER — Ambulatory Visit (INDEPENDENT_AMBULATORY_CARE_PROVIDER_SITE_OTHER): Payer: 59 | Admitting: Adult Health

## 2013-06-02 ENCOUNTER — Ambulatory Visit (HOSPITAL_COMMUNITY)
Admission: RE | Admit: 2013-06-02 | Discharge: 2013-06-02 | Disposition: A | Discharge status: Home / Self Care | Payer: 59 | Source: Ambulatory Visit | Attending: Adult Health | Admitting: Adult Health

## 2013-06-02 ENCOUNTER — Encounter: Payer: Self-pay | Admitting: Adult Health

## 2013-06-02 VITALS — BP 142/76 | HR 72 | Ht 64.0 in | Wt 183.0 lb

## 2013-06-02 DIAGNOSIS — Z139 Encounter for screening, unspecified: Secondary | ICD-10-CM

## 2013-06-02 DIAGNOSIS — Z01419 Encounter for gynecological examination (general) (routine) without abnormal findings: Secondary | ICD-10-CM

## 2013-06-02 DIAGNOSIS — Z1231 Encounter for screening mammogram for malignant neoplasm of breast: Secondary | ICD-10-CM | POA: Insufficient documentation

## 2013-06-02 DIAGNOSIS — Z1212 Encounter for screening for malignant neoplasm of rectum: Secondary | ICD-10-CM

## 2013-06-02 DIAGNOSIS — Z1151 Encounter for screening for human papillomavirus (HPV): Secondary | ICD-10-CM | POA: Insufficient documentation

## 2013-06-02 DIAGNOSIS — Z8541 Personal history of malignant neoplasm of cervix uteri: Secondary | ICD-10-CM | POA: Insufficient documentation

## 2013-06-02 LAB — HEMOCCULT GUIAC POC 1CARD (OFFICE): Fecal Occult Blood, POC: NEGATIVE

## 2013-06-02 IMAGING — MG MM DIGITAL SCREENING
4 series · 4 of 4 positions shown · non-contrast
Comparison: Previous exam(s)

ACR Breast Density Category a: The breast tissue is almost entirely
fatty.

CLINICAL DATA: Screening.

EXAM:
DIGITAL SCREENING BILATERAL MAMMOGRAM WITH CAD

[L CC]
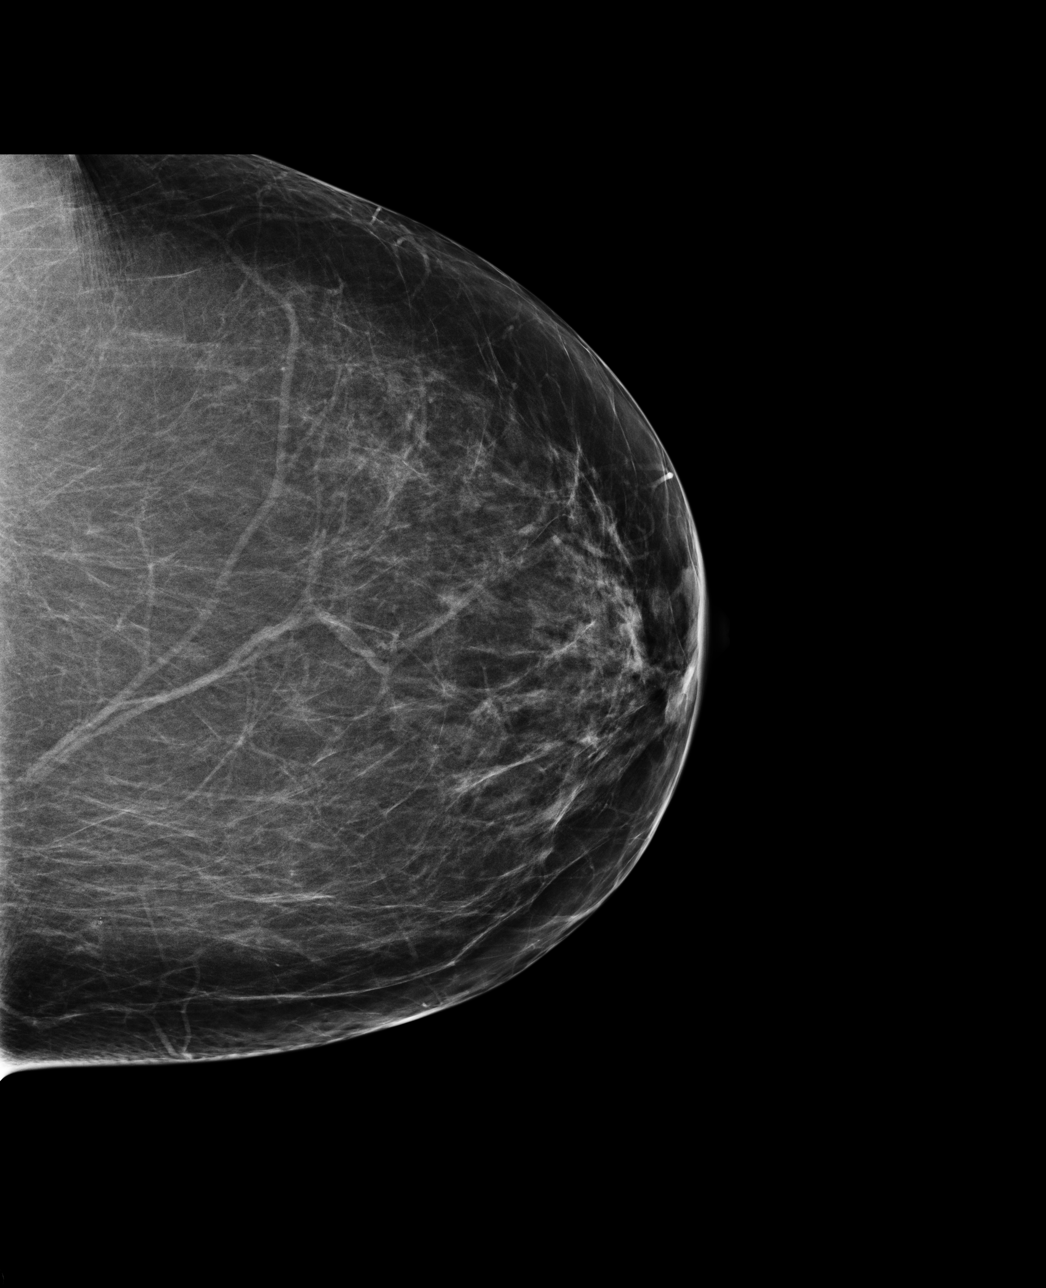

[L MLO]
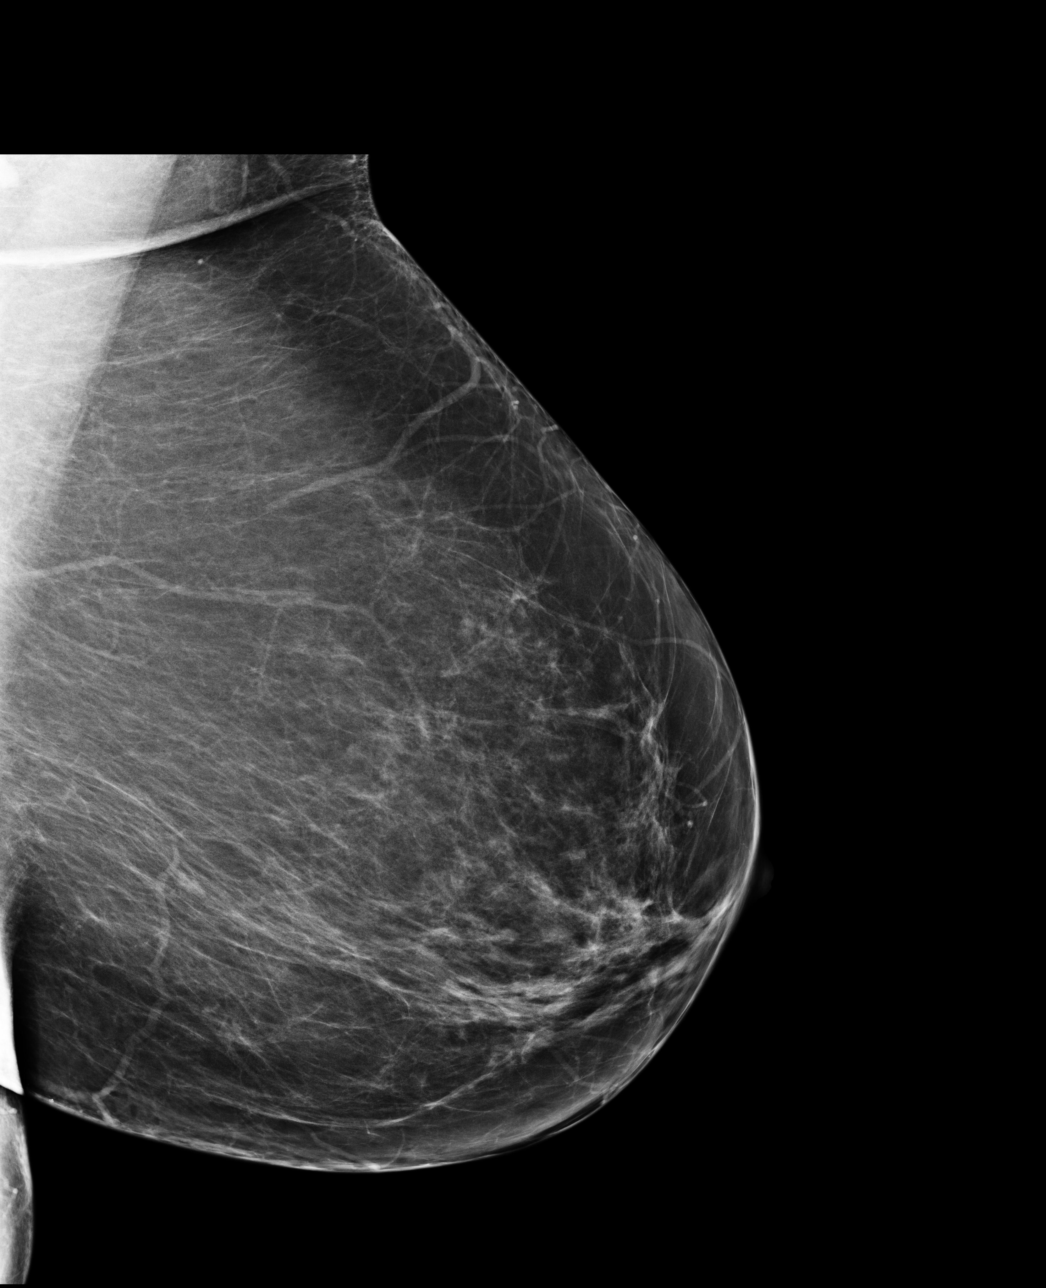

[R CC]
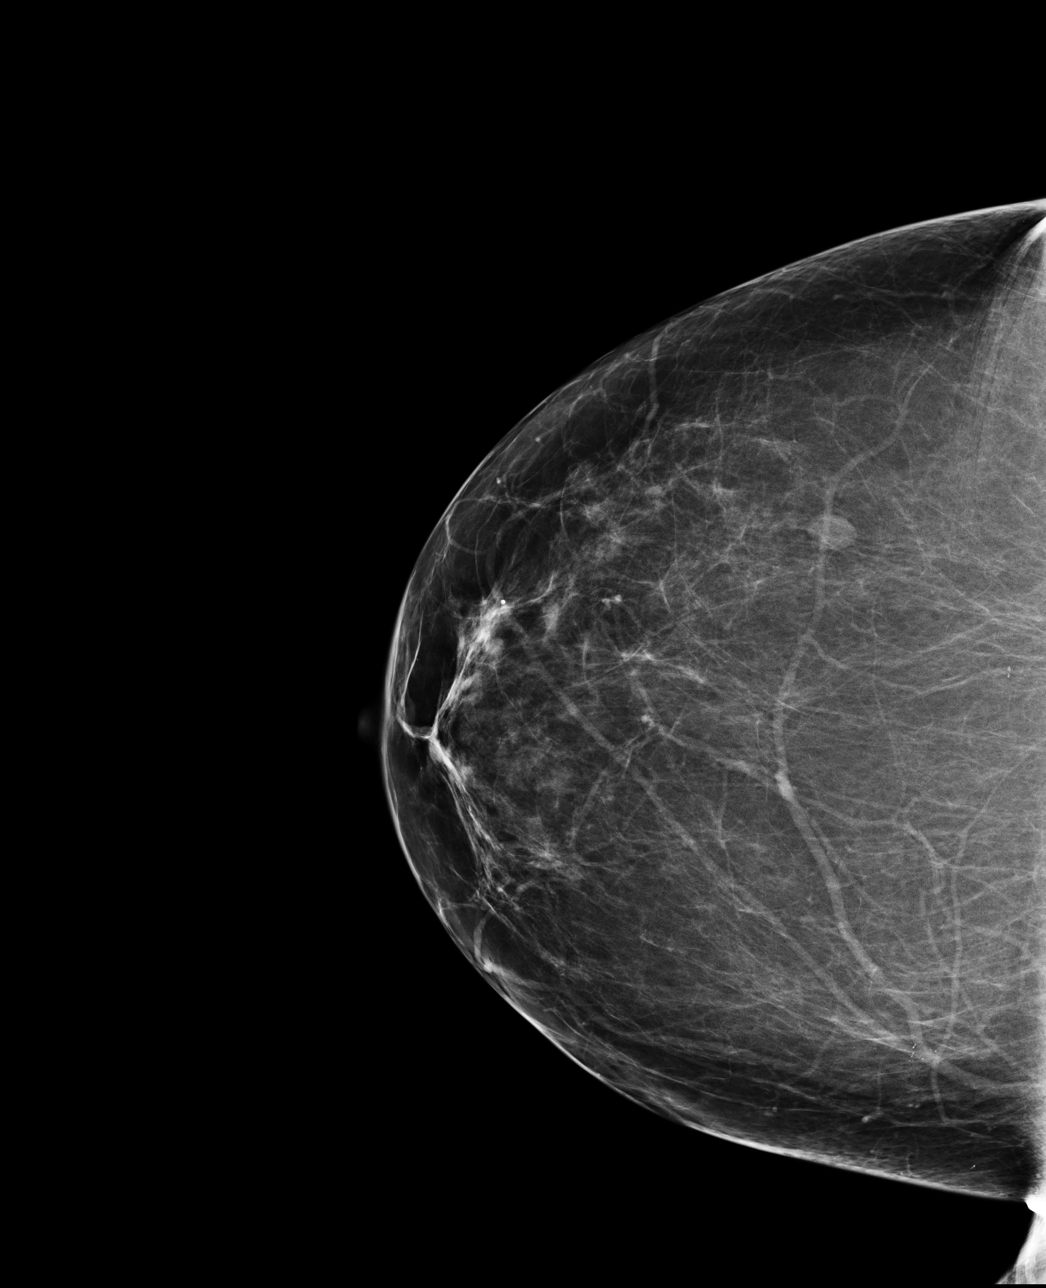

[R MLO]
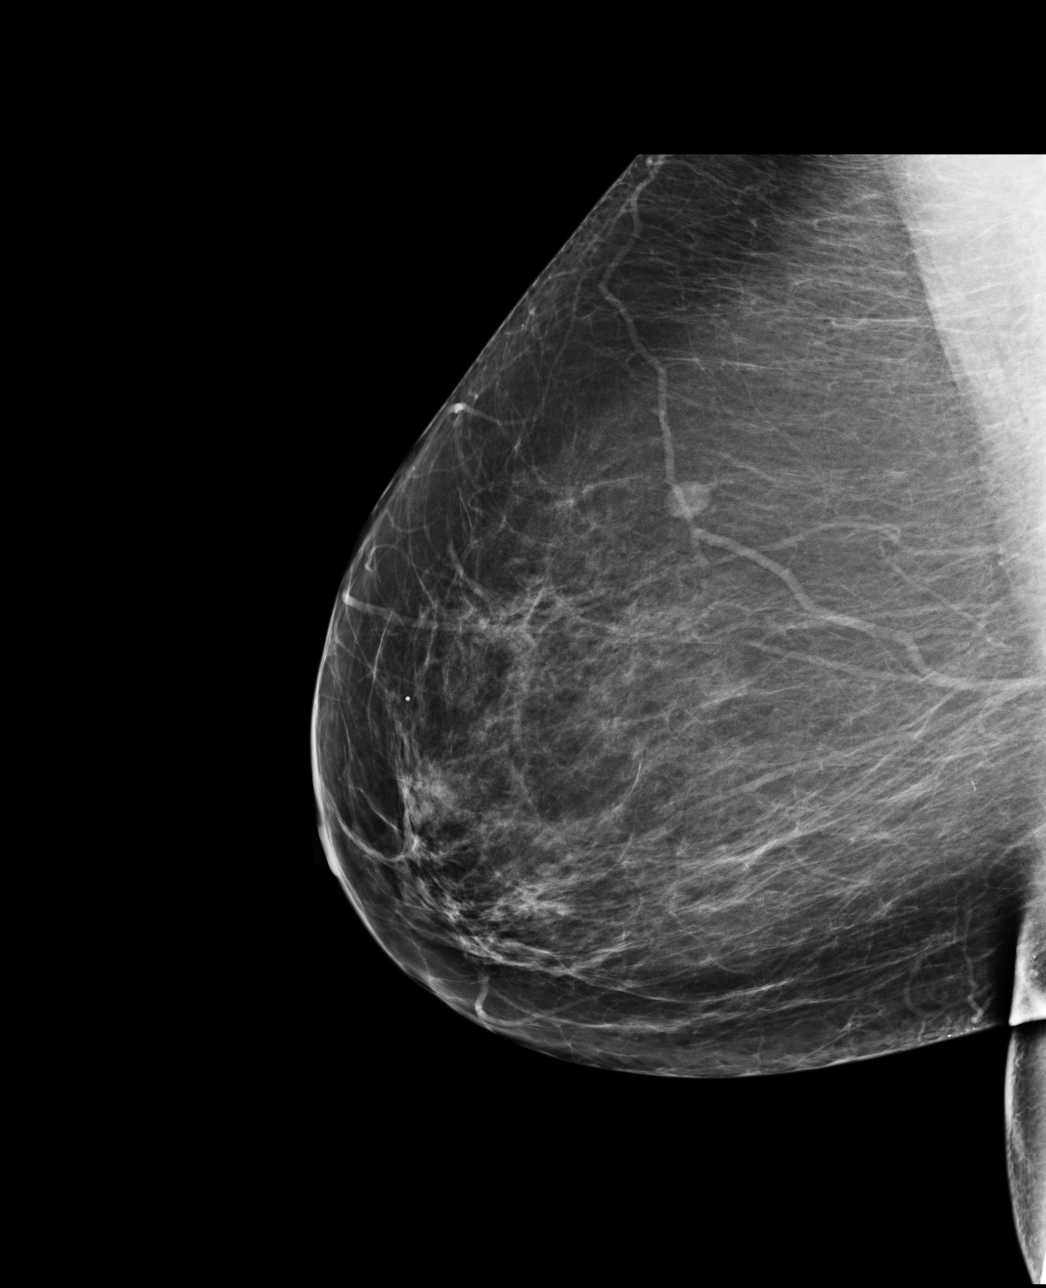

[4 of 4 positions shown; findings below may reference images not displayed]

FINDINGS: There are no findings suspicious for malignancy. Images were
processed with CAD.
IMPRESSION: No mammographic evidence of malignancy. A result letter of this
screening mammogram will be mailed directly to the patient.

RECOMMENDATION:
Screening mammogram in one year. (Code:[ED])

BI-RADS CATEGORY  1: Negative

## 2013-06-02 NOTE — Progress Notes (Signed)
Patient ID: Jacqueline Leon, female   DOB: 1956/02/08, 57 y.o.   MRN: [id number] History of Present Illness: Jacqueline Leon is a 56 year old black female in for pap and physical.No complaints.Had mammogram this am,and pap last year was ASCUS negative HPV.Had normal labs last year.   Current Medications, Allergies, Past Medical History, Past Surgical History, Family History and Social History were reviewed in [name] record.     Review of Systems: Patient denies any headaches, blurred vision, shortness of breath, chest pain, abdominal pain, problems with bowel movements, urination, or intercourse. No joint pain or mood changes.    Physical Exam:BP 142/76  Pulse 72  Ht 5\' 4"  (1.626 m)  Wt 183 lb (83.008 kg)  BMI 31.4 kg/m2 General:  Well developed, well nourished, no acute distress Skin:  Warm and dry Neck:  Midline trachea, normal thyroid Lungs; Clear to auscultation bilaterally Breast:  No dominant palpable mass, retraction, or nipple discharge Cardiovascular: Regular rate and rhythm Abdomen:  Soft, non tender, no hepatosplenomegaly Pelvic:  External genitalia is normal in appearance.  The vagina is normal in appearance. The cervix and uterus are absent,pap performed.  No adnexal masses or tenderness noted. Rectal: Good sphincter tone, no polyps, or hemorrhoids felt.  Hemoccult negative. Extremities:  No swelling or varicosities noted Psych:  No mood changes, alert and cooperative   Impression: Yearly gyn exam History of cervical cancer    Plan: Physical in 1 year Mammogram yearly  Colonoscopy 2019

## 2013-06-02 NOTE — Patient Instructions (Signed)
Physical in 1 year Mammogram yearly Colonoscopy 2019

## 2013-06-21 ENCOUNTER — Other Ambulatory Visit: Payer: Self-pay | Admitting: Adult Health

## 2014-04-23 ENCOUNTER — Other Ambulatory Visit: Payer: Self-pay | Admitting: Adult Health

## 2014-05-09 ENCOUNTER — Other Ambulatory Visit: Payer: Self-pay | Admitting: Adult Health

## 2014-05-09 DIAGNOSIS — Z1231 Encounter for screening mammogram for malignant neoplasm of breast: Secondary | ICD-10-CM

## 2014-06-04 ENCOUNTER — Encounter: Payer: Self-pay | Admitting: Adult Health

## 2014-06-08 ENCOUNTER — Ambulatory Visit (HOSPITAL_COMMUNITY)
Admission: RE | Admit: 2014-06-08 | Discharge: 2014-06-08 | Disposition: A | Discharge status: Home / Self Care | Payer: 59 | Source: Ambulatory Visit | Attending: Adult Health | Admitting: Adult Health

## 2014-06-08 ENCOUNTER — Ambulatory Visit (INDEPENDENT_AMBULATORY_CARE_PROVIDER_SITE_OTHER): Payer: 59 | Admitting: Adult Health

## 2014-06-08 ENCOUNTER — Other Ambulatory Visit (HOSPITAL_COMMUNITY)
Admission: RE | Admit: 2014-06-08 | Discharge: 2014-06-08 | Disposition: A | Discharge status: Home / Self Care | Payer: 59 | Source: Ambulatory Visit | Attending: Obstetrics and Gynecology | Admitting: Obstetrics and Gynecology

## 2014-06-08 ENCOUNTER — Encounter: Payer: Self-pay | Admitting: Adult Health

## 2014-06-08 VITALS — BP 130/84 | HR 78 | Ht 63.5 in | Wt 185.0 lb

## 2014-06-08 DIAGNOSIS — Z01411 Encounter for gynecological examination (general) (routine) with abnormal findings: Secondary | ICD-10-CM | POA: Diagnosis not present

## 2014-06-08 DIAGNOSIS — Z1231 Encounter for screening mammogram for malignant neoplasm of breast: Secondary | ICD-10-CM

## 2014-06-08 DIAGNOSIS — I10 Essential (primary) hypertension: Secondary | ICD-10-CM

## 2014-06-08 DIAGNOSIS — Z01419 Encounter for gynecological examination (general) (routine) without abnormal findings: Secondary | ICD-10-CM

## 2014-06-08 DIAGNOSIS — Z8541 Personal history of malignant neoplasm of cervix uteri: Secondary | ICD-10-CM

## 2014-06-08 DIAGNOSIS — Z1212 Encounter for screening for malignant neoplasm of rectum: Secondary | ICD-10-CM

## 2014-06-08 DIAGNOSIS — E7800 Pure hypercholesterolemia, unspecified: Secondary | ICD-10-CM

## 2014-06-08 LAB — CBC
HEMATOCRIT: 40.6 % (ref 36.0–46.0)
Hemoglobin: 14 g/dL (ref 12.0–15.0)
MCH: 30.4 pg (ref 26.0–34.0)
MCHC: 34.5 g/dL (ref 30.0–36.0)
MCV: 88.3 fL (ref 78.0–100.0)
PLATELETS: 254 10*3/uL (ref 150–400)
RBC: 4.6 MIL/uL (ref 3.87–5.11)
RDW: 13.6 % (ref 11.5–15.5)
WBC: 6.5 10*3/uL (ref 4.0–10.5)

## 2014-06-08 LAB — COMPREHENSIVE METABOLIC PANEL
ALT: 20 U/L (ref 0–35)
AST: 21 U/L (ref 0–37)
Albumin: 3.8 g/dL (ref 3.5–5.2)
Alkaline Phosphatase: 53 U/L (ref 39–117)
BUN: 17 mg/dL (ref 6–23)
CO2: 26 meq/L (ref 19–32)
CREATININE: 0.95 mg/dL (ref 0.50–1.10)
Calcium: 9.3 mg/dL (ref 8.4–10.5)
Chloride: 101 mEq/L (ref 96–112)
GLUCOSE: 87 mg/dL (ref 70–99)
POTASSIUM: 4.2 meq/L (ref 3.5–5.3)
Sodium: 140 mEq/L (ref 135–145)
TOTAL PROTEIN: 6.7 g/dL (ref 6.0–8.3)
Total Bilirubin: 0.3 mg/dL (ref 0.2–1.2)

## 2014-06-08 LAB — HEMOCCULT GUIAC POC 1CARD (OFFICE): Fecal Occult Blood, POC: NEGATIVE

## 2014-06-08 LAB — LIPID PANEL
Cholesterol: 170 mg/dL (ref 0–200)
HDL: 65 mg/dL (ref >39)
LDL CALC: 95 mg/dL (ref 0–99)
TRIGLYCERIDES: 49 mg/dL (ref <150)
Total CHOL/HDL Ratio: 2.6 Ratio
VLDL: 10 mg/dL (ref 0–40)

## 2014-06-08 LAB — TSH: TSH: 2.234 u[IU]/mL (ref 0.350–4.500)

## 2014-06-08 IMAGING — MG MM DIGITAL SCREENING BILAT
4 series · 4 of 4 positions shown · non-contrast
Comparison: Previous exam(s).

CLINICAL DATA: Screening.

EXAM:
DIGITAL SCREENING BILATERAL MAMMOGRAM WITH CAD

[L CC]
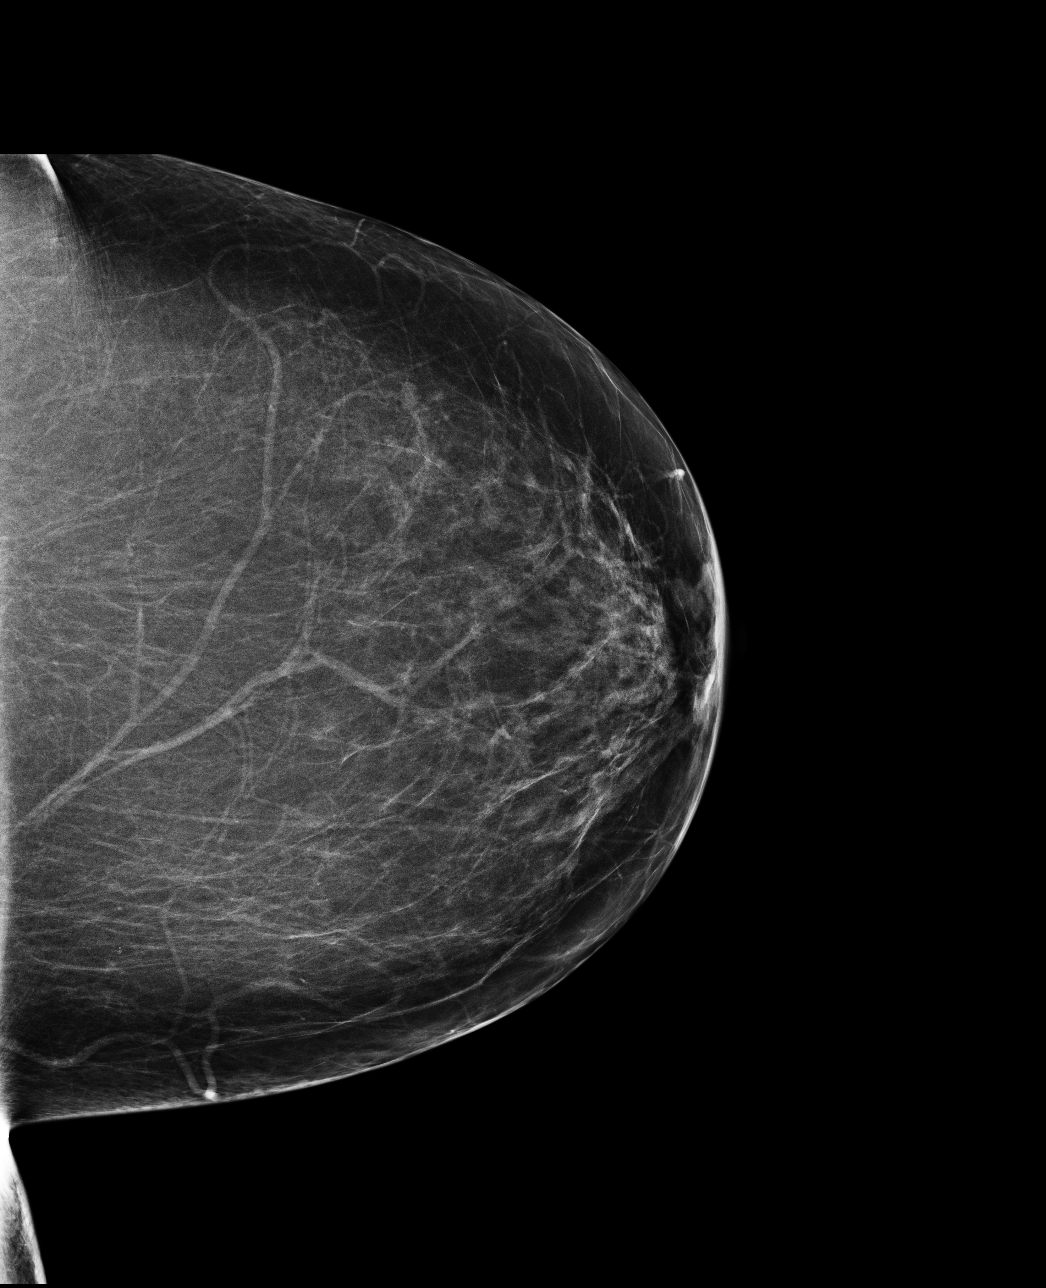

[L MLO]
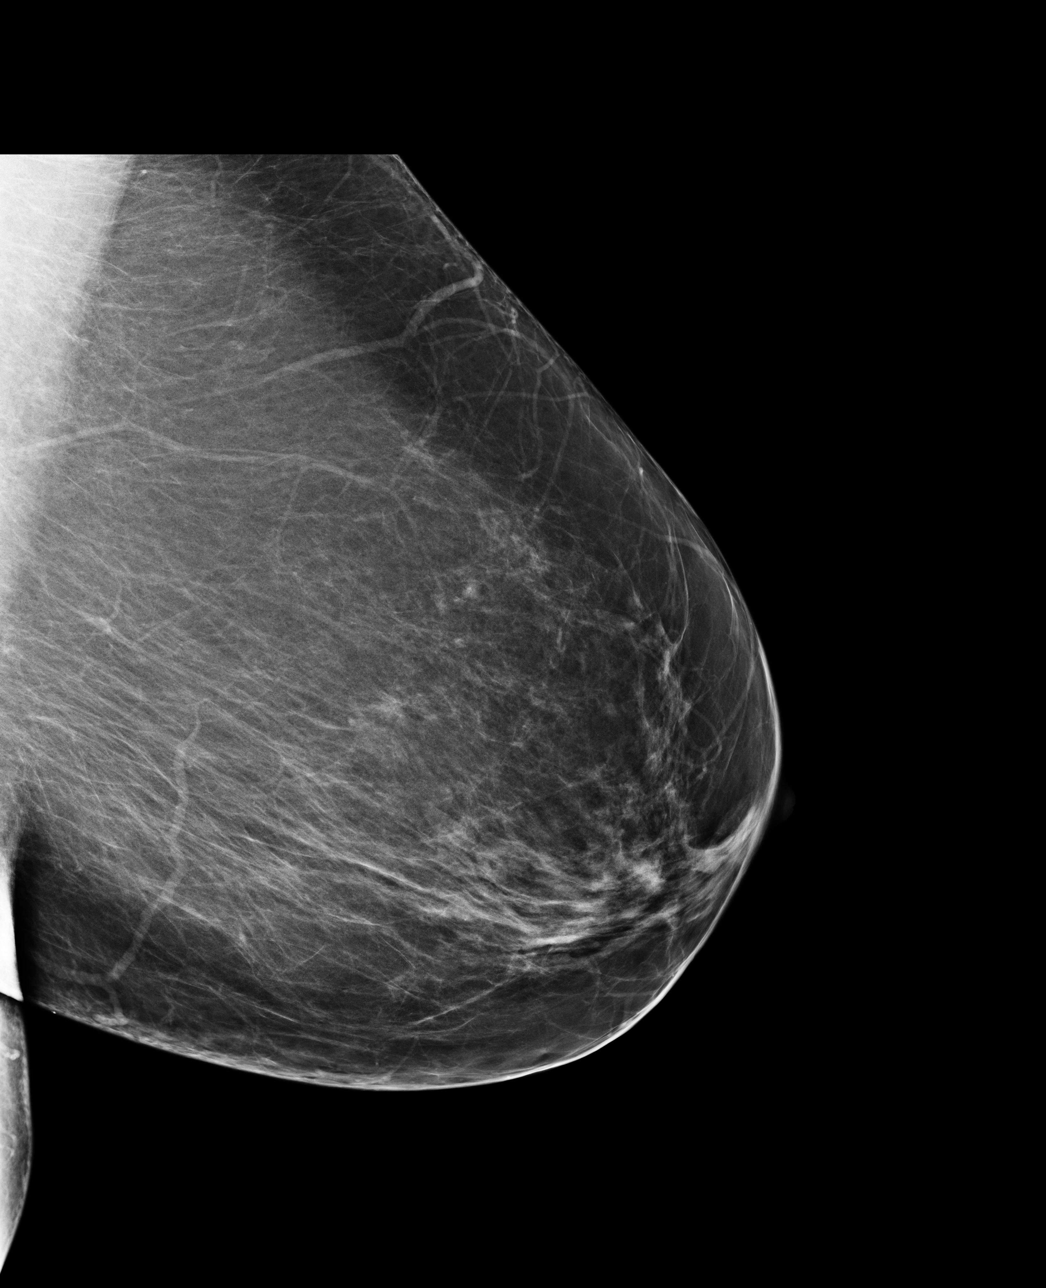

[R CC]
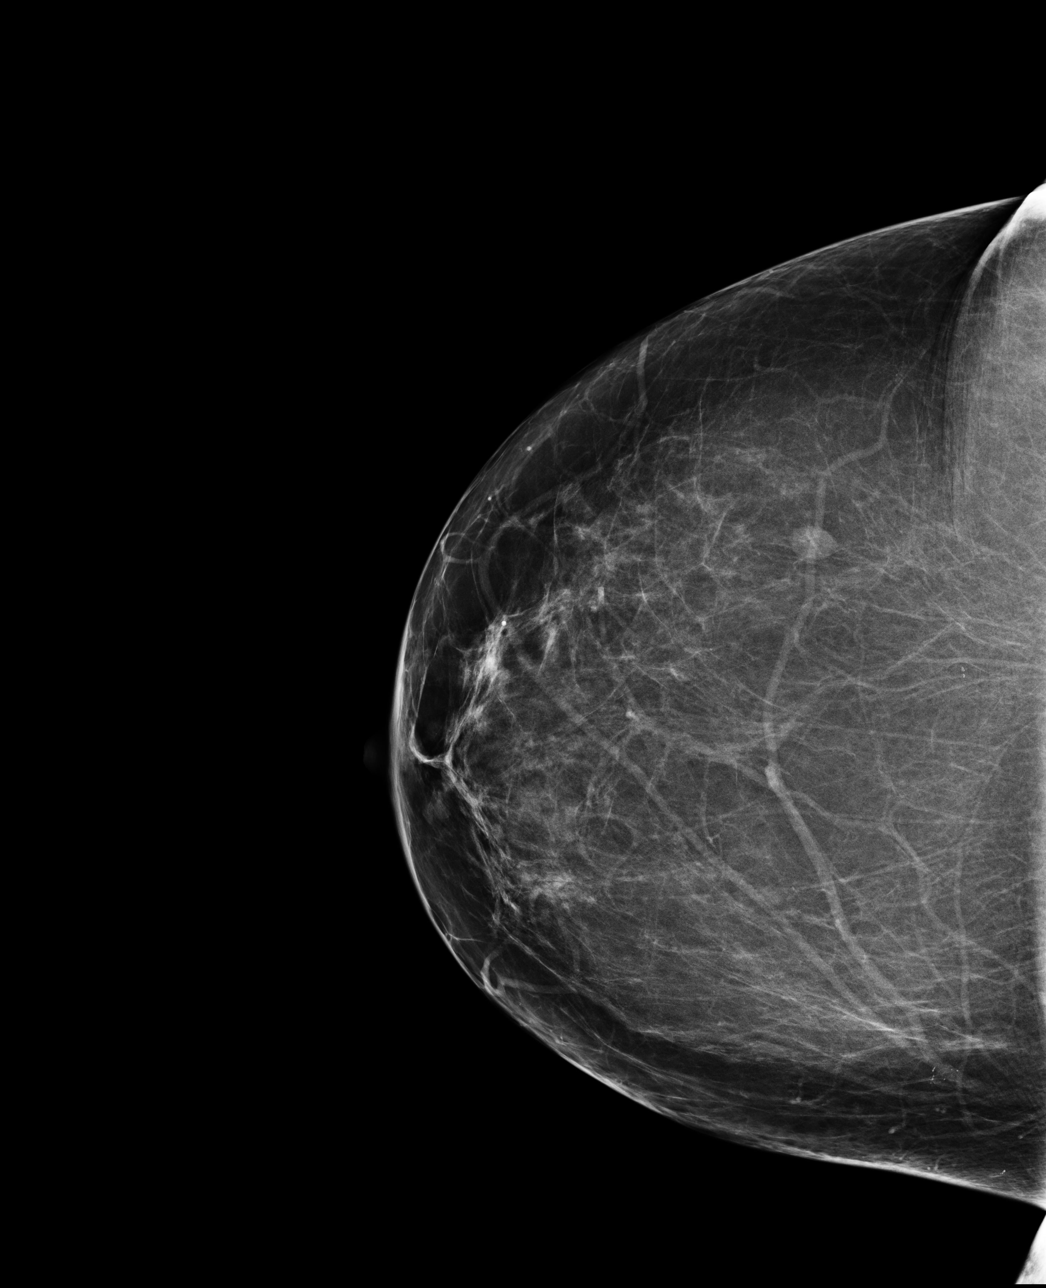

[R MLO]
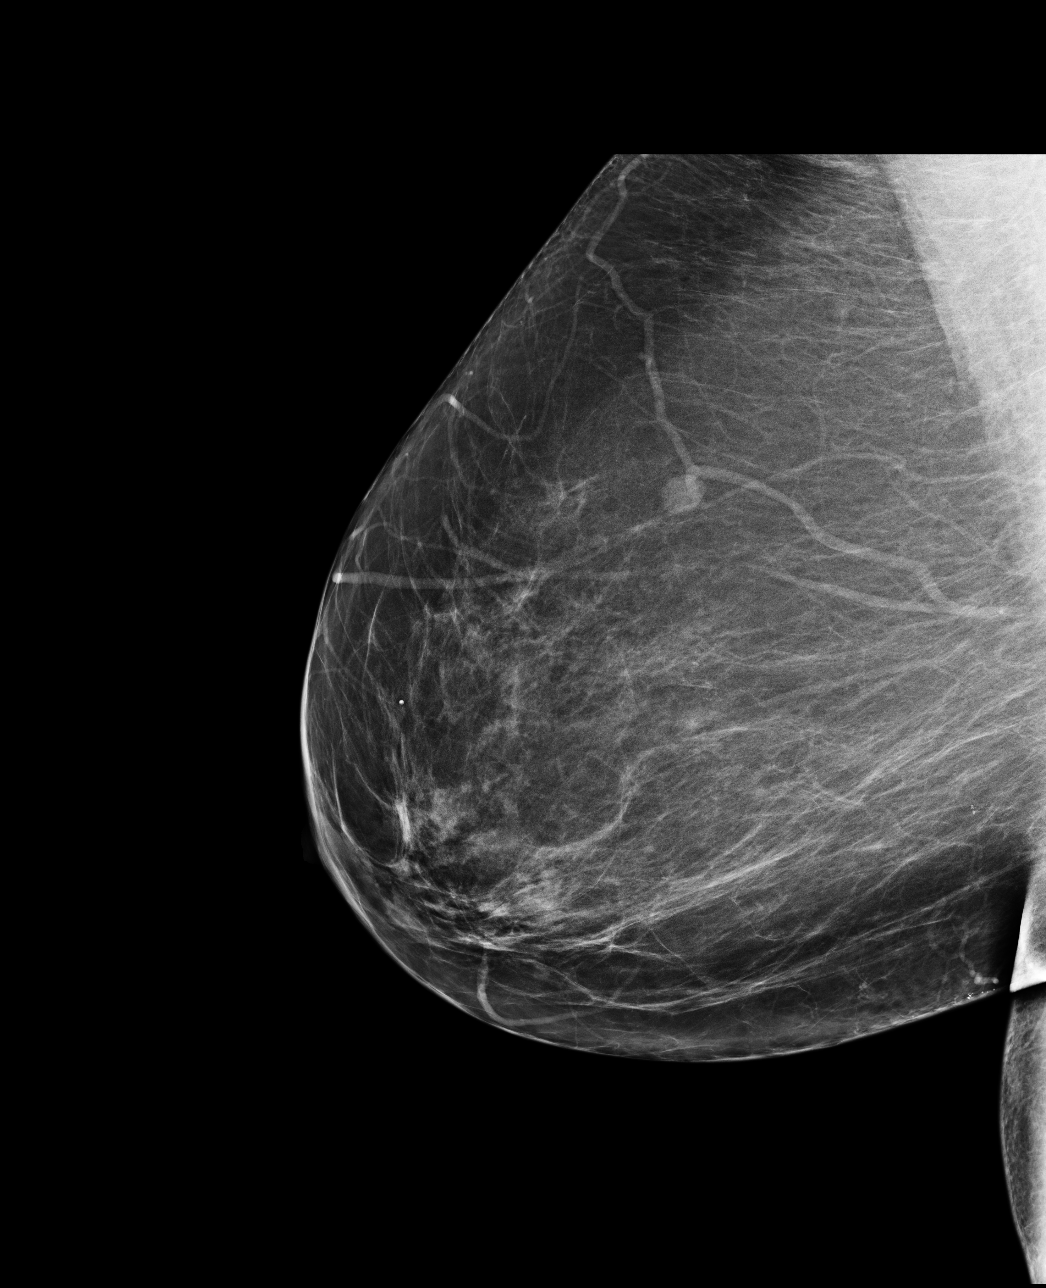

[4 of 4 positions shown; findings below may reference images not displayed]

ACR Breast Density Category b: There are scattered areas of
fibroglandular density.
FINDINGS: There are no findings suspicious for malignancy. Images were
processed with CAD.
IMPRESSION: No mammographic evidence of malignancy. A result letter of this
screening mammogram will be mailed directly to the patient.

RECOMMENDATION:
Screening mammogram in one year. (Code:[US])

BI-RADS CATEGORY  1: Negative.

## 2014-06-08 NOTE — Patient Instructions (Signed)
Physical in 1 year Mammogram yearly Colonoscopy per GI 

## 2014-06-08 NOTE — Progress Notes (Signed)
Patient ID: Jacqueline NoeCynthia J Leon, female   DOB: 11/26/1955, 58 y.o.   MRN: [id number] History of Present Illness: Jacqueline BeechamCynthia is a 58 year old black female,married, in for pap and physical. She had a normal pap with negative HPV 06/02/13, but wants pap this year,has history of cervical cancer,sp hysterectomy.   Current Medications, Allergies, Past Medical History, Past Surgical History, Family History and Social History were reviewed in [facility] electronic medical record.     Review of Systems: Patient denies any headaches, blurred vision, shortness of breath, chest pain, abdominal pain, problems with bowel movements, urination, or intercourse. No joint pain or mood swings.    Physical Exam:BP 130/84 mmHg  Pulse 78  Ht 5' 3.5" (1.613 m)  Wt 185 lb (83.915 kg)  BMI 32.25 kg/m2 General:  Well developed, well nourished, no acute distress Skin:  Warm and dry Neck:  Midline trachea, normal thyroid Lungs; Clear to auscultation bilaterally Breast:  No dominant palpable mass, retraction, or nipple discharge,had mammogram this am Cardiovascular: Regular rate and rhythm Abdomen:  Soft, non tender, no hepatosplenomegaly Pelvic:  External genitalia is normal in appearance, no lesions.  The vagina has good color,there is loss of moisture and rugae.  The cervix and uterus are absent,pap performed of cuff and vagina.No        adnexal masses or tenderness noted. Rectal: Good sphincter tone, no polyps, or hemorrhoids felt.  Hemoccult negative. Extremities:  No swelling or varicosities noted Psych:  No mood changes,alert and cooperative,seems happy   Impression: Well woman exam with pap Elevated cholesterol Hypertension     Plan:  Continue current meds Mammogram yearly  Colonoscopy per GI Check CBC,CMP,TSH and lipids Declines flu shot

## 2014-06-11 ENCOUNTER — Telehealth: Payer: Self-pay | Admitting: Adult Health

## 2014-06-11 LAB — CYTOLOGY - PAP

## 2014-06-11 NOTE — Telephone Encounter (Signed)
Left message labs excellent 

## 2014-08-03 ENCOUNTER — Other Ambulatory Visit: Payer: Self-pay | Admitting: Adult Health

## 2014-12-21 ENCOUNTER — Other Ambulatory Visit: Payer: Self-pay | Admitting: Adult Health

## 2015-06-05 ENCOUNTER — Other Ambulatory Visit: Payer: Self-pay | Admitting: Adult Health

## 2015-06-05 DIAGNOSIS — Z1231 Encounter for screening mammogram for malignant neoplasm of breast: Secondary | ICD-10-CM

## 2015-07-05 ENCOUNTER — Ambulatory Visit (HOSPITAL_COMMUNITY)
Admission: RE | Admit: 2015-07-05 | Discharge: 2015-07-05 | Disposition: A | Discharge status: Home / Self Care | Payer: 59 | Source: Ambulatory Visit | Attending: Adult Health | Admitting: Adult Health

## 2015-07-05 ENCOUNTER — Encounter: Payer: Self-pay | Admitting: Adult Health

## 2015-07-05 ENCOUNTER — Ambulatory Visit (INDEPENDENT_AMBULATORY_CARE_PROVIDER_SITE_OTHER): Payer: 59 | Admitting: Adult Health

## 2015-07-05 ENCOUNTER — Other Ambulatory Visit (HOSPITAL_COMMUNITY)
Admission: RE | Admit: 2015-07-05 | Discharge: 2015-07-05 | Disposition: A | Discharge status: Home / Self Care | Payer: 59 | Source: Ambulatory Visit | Attending: Adult Health | Admitting: Adult Health

## 2015-07-05 VITALS — BP 132/80 | HR 80 | Ht 63.25 in | Wt 179.0 lb

## 2015-07-05 DIAGNOSIS — Z1151 Encounter for screening for human papillomavirus (HPV): Secondary | ICD-10-CM | POA: Diagnosis not present

## 2015-07-05 DIAGNOSIS — Z8541 Personal history of malignant neoplasm of cervix uteri: Secondary | ICD-10-CM

## 2015-07-05 DIAGNOSIS — Z01411 Encounter for gynecological examination (general) (routine) with abnormal findings: Secondary | ICD-10-CM | POA: Diagnosis present

## 2015-07-05 DIAGNOSIS — Z1212 Encounter for screening for malignant neoplasm of rectum: Secondary | ICD-10-CM

## 2015-07-05 DIAGNOSIS — I10 Essential (primary) hypertension: Secondary | ICD-10-CM

## 2015-07-05 DIAGNOSIS — Z1231 Encounter for screening mammogram for malignant neoplasm of breast: Secondary | ICD-10-CM | POA: Diagnosis present

## 2015-07-05 DIAGNOSIS — Z01419 Encounter for gynecological examination (general) (routine) without abnormal findings: Secondary | ICD-10-CM | POA: Diagnosis not present

## 2015-07-05 LAB — HEMOCCULT GUIAC POC 1CARD (OFFICE): FECAL OCCULT BLD: NEGATIVE

## 2015-07-05 IMAGING — MG MM DIGITAL SCREENING
4 series · 4 of 4 positions shown · non-contrast
Comparison: Previous exam(s).

CLINICAL DATA: Screening.

EXAM:
DIGITAL SCREENING BILATERAL MAMMOGRAM WITH CAD

[L CC]
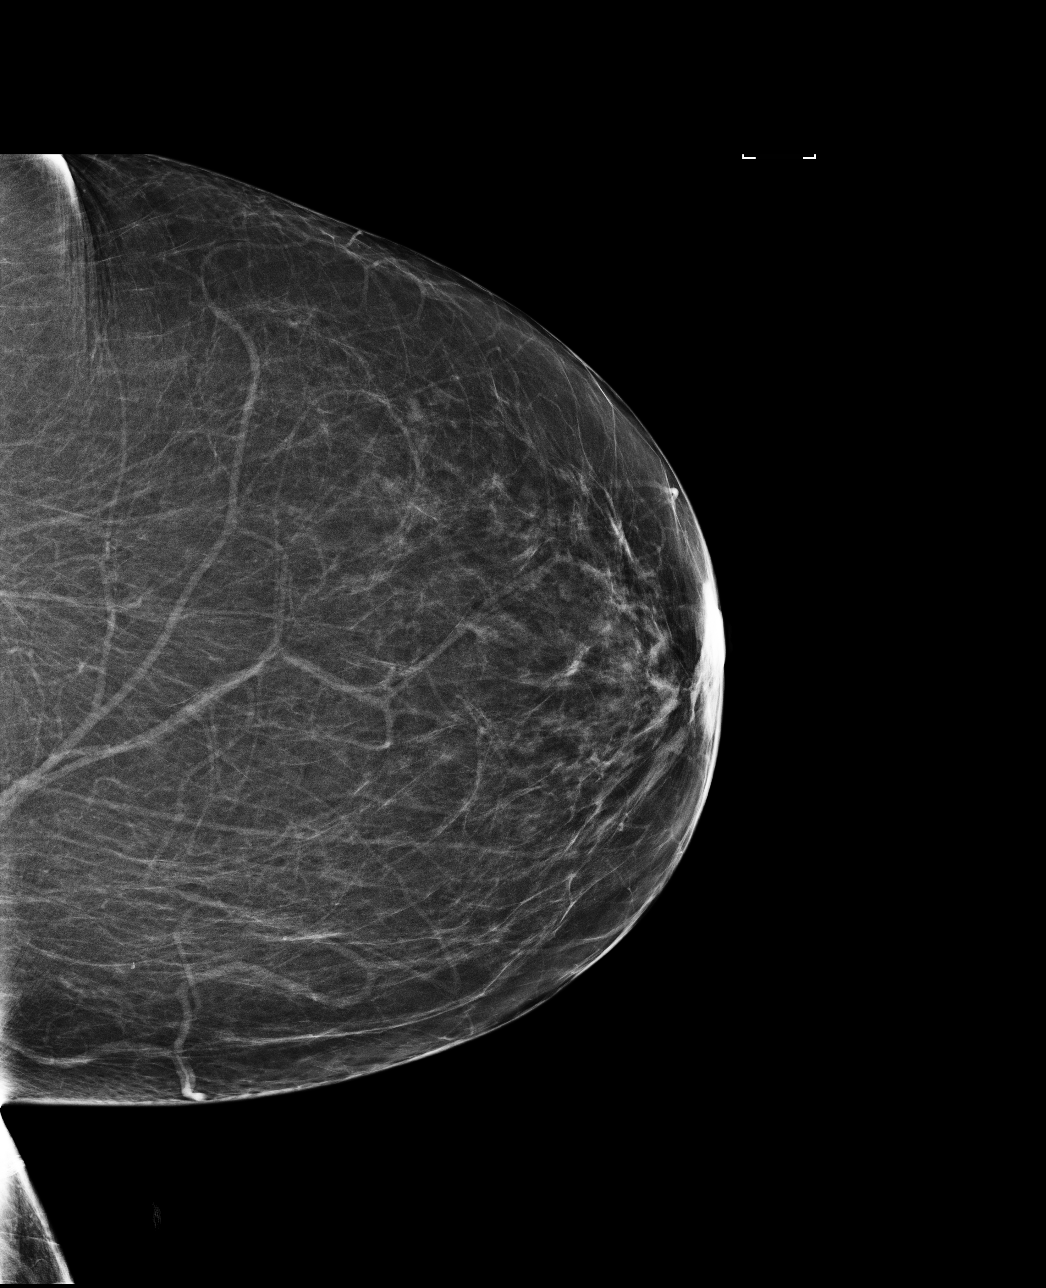

[R CC]
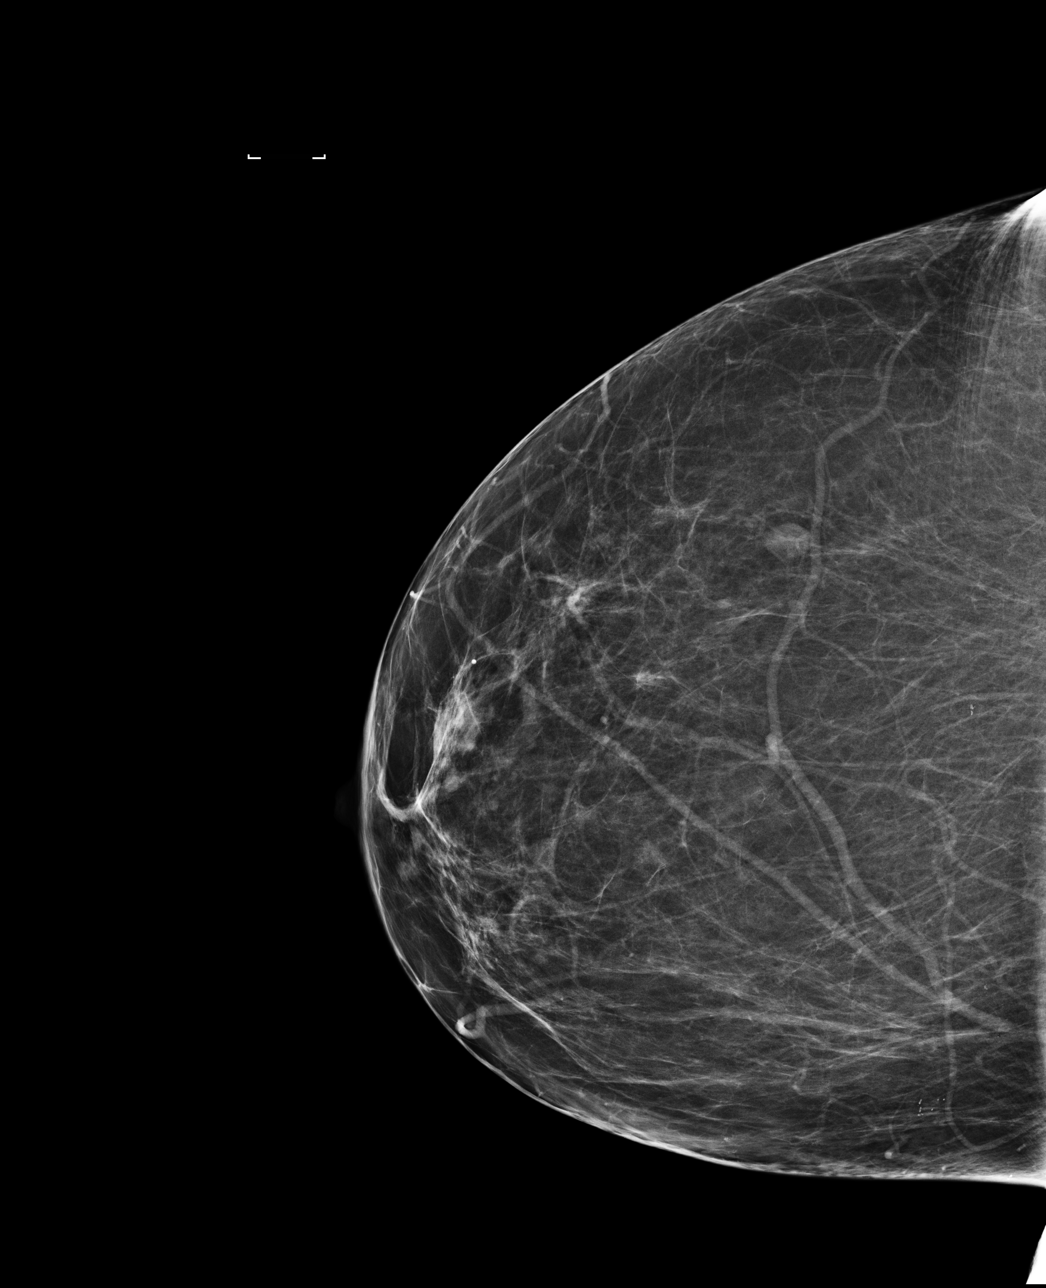

[L MLO]
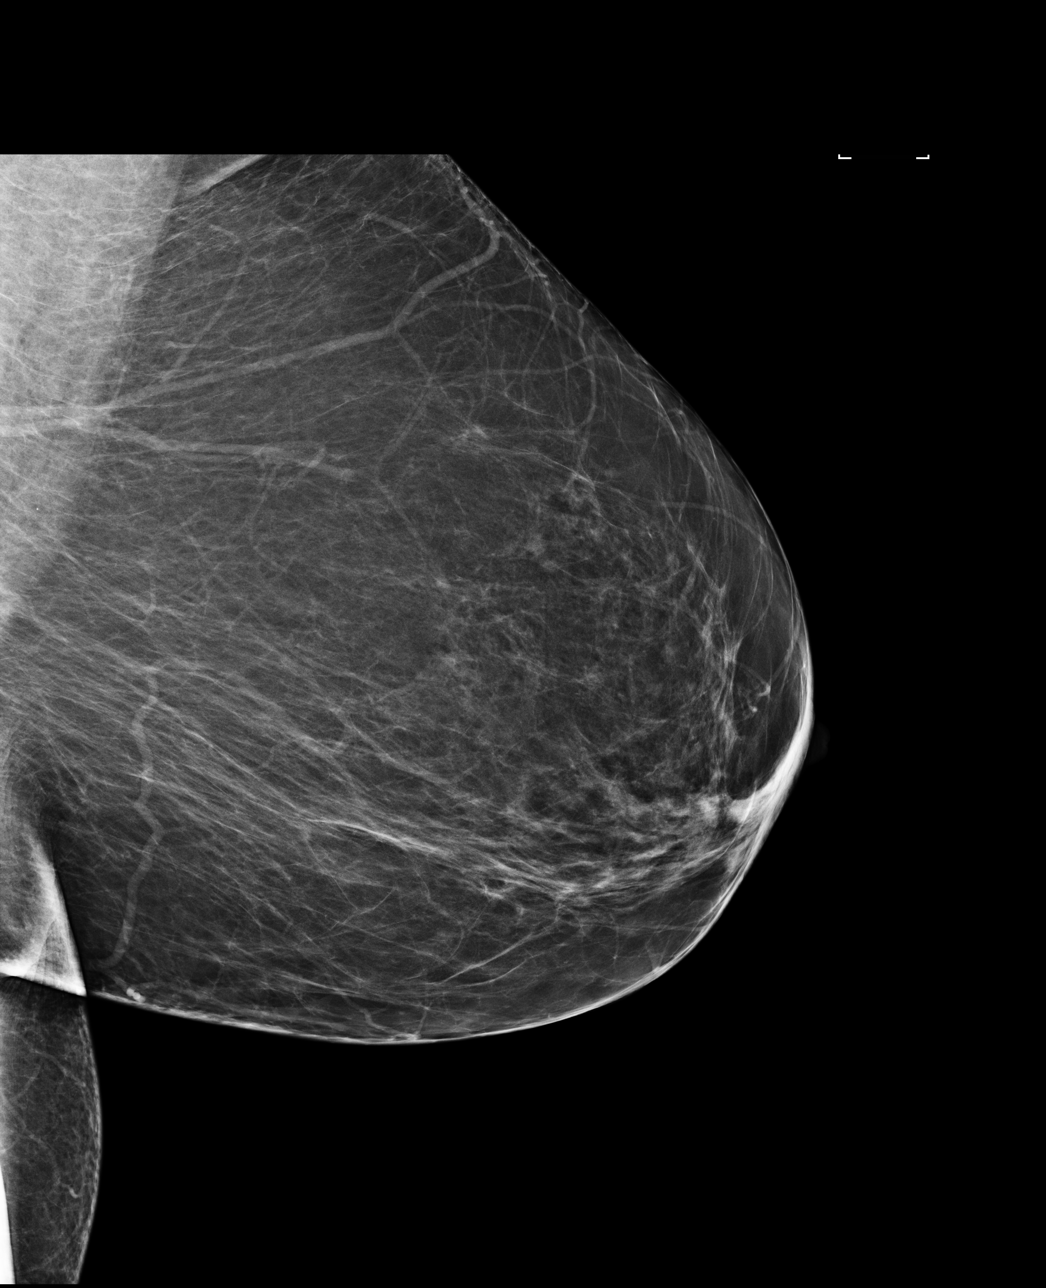

[R MLO]
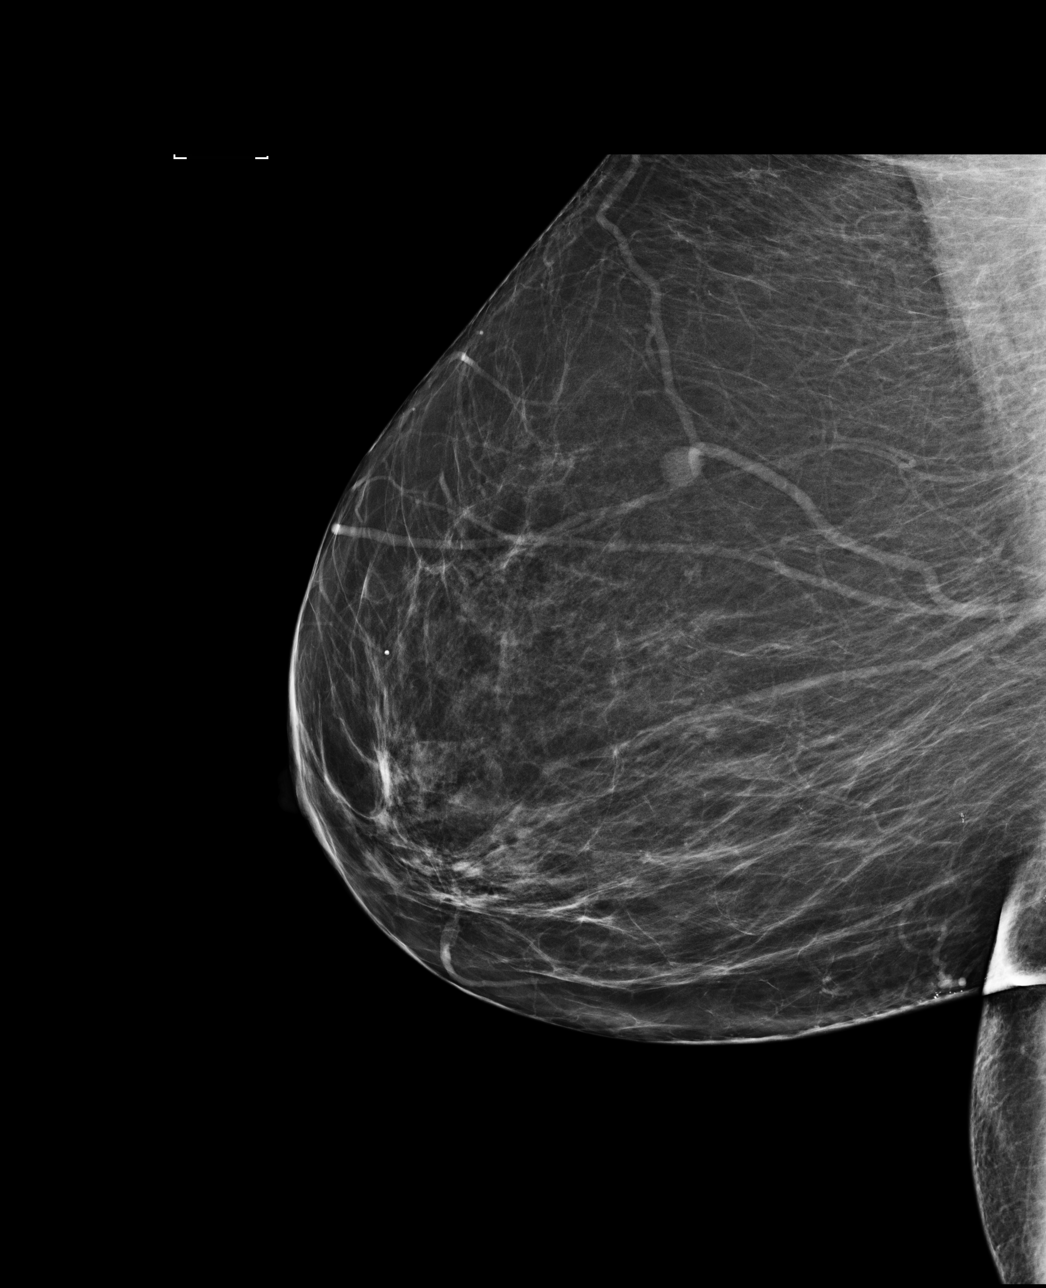

[4 of 4 positions shown; findings below may reference images not displayed]

ACR Breast Density Category b: There are scattered areas of
fibroglandular density.
FINDINGS: There are no findings suspicious for malignancy. Images were
processed with CAD.
IMPRESSION: No mammographic evidence of malignancy. A result letter of this
screening mammogram will be mailed directly to the patient.

RECOMMENDATION:
Screening mammogram in one year. (Code:[US])

BI-RADS CATEGORY  1: Negative.

## 2015-07-05 NOTE — Progress Notes (Signed)
Patient ID: Jacqueline Leon, female   DOB: [birth year], 59 y.o.   MRN: [id number] History of Present Illness:  Jacqueline Leon is a 59 year old black female,married in for a well woman gyn exam and pap,she had cervical cancer and is sp hysterectomy. She is still working. PCP is Dr Phillips OdorGolding.  Current Medications, Allergies, Past Medical History, Past Surgical History, Family History and Social History were reviewed in [facility] electronic medical record.     Review of Systems: Patient denies any headaches, hearing loss, fatigue, blurred vision, shortness of breath, chest pain, abdominal pain, problems with bowel movements, urination, or intercourse. No joint pain or mood swings.    Physical Exam:BP 132/80 mmHg  Pulse 80  Ht 5' 3.25" (1.607 m)  Wt 179 lb (81.194 kg)  BMI 31.44 kg/m2 General:  Well developed, well nourished, no acute distress Skin:  Warm and dry Neck:  Midline trachea, normal thyroid, good ROM, no lymphadenopathy,no carotid bruits heard Lungs; Clear to auscultation bilaterally Breast:  No dominant palpable mass, retraction, or nipple discharge Cardiovascular: Regular rate and rhythm Abdomen:  Soft, non tender, no hepatosplenomegaly Pelvic:  External genitalia is normal in appearance, no lesions.  The vagina is normal in appearance. Urethra has no lesions or masses. The cervix and uterus are absent.   No adnexal masses or tenderness noted.Bladder is non tender, no masses felt. Rectal: Good sphincter tone, no polyps, or hemorrhoids felt.  Hemoccult negative. Extremities/musculoskeletal:  No swelling or varicosities noted, no clubbing or cyanosis Psych:  No mood changes, alert and cooperative,seems happy She declines the flu shot, but is encouraged to get the pneumonia shot and shingles shot at PCP.  Impression: Well woman gyn exam with pap History of cervical cancer Hypertension     Plan: Physical in 1 year Mammogram yearly  Labs with PCP Colonoscopy per GI Follow up  with PCP for BP and general health

## 2015-07-05 NOTE — Patient Instructions (Signed)
Physical in  1 year Mammogram yearly Colonoscopy per GI  Labs with PCP 

## 2015-07-08 LAB — CYTOLOGY - PAP

## 2015-08-06 ENCOUNTER — Other Ambulatory Visit: Payer: Self-pay | Admitting: Adult Health

## 2016-03-31 ENCOUNTER — Other Ambulatory Visit: Payer: Self-pay | Admitting: Adult Health

## 2016-05-19 ENCOUNTER — Other Ambulatory Visit: Payer: Self-pay | Admitting: Adult Health

## 2016-05-19 DIAGNOSIS — Z1231 Encounter for screening mammogram for malignant neoplasm of breast: Secondary | ICD-10-CM

## 2016-07-10 ENCOUNTER — Other Ambulatory Visit: Payer: 59 | Admitting: Adult Health

## 2016-07-10 ENCOUNTER — Ambulatory Visit (HOSPITAL_COMMUNITY): Payer: 59

## 2016-07-29 ENCOUNTER — Encounter: Payer: Self-pay | Admitting: Adult Health

## 2016-07-29 ENCOUNTER — Ambulatory Visit (INDEPENDENT_AMBULATORY_CARE_PROVIDER_SITE_OTHER): Payer: 59 | Admitting: Adult Health

## 2016-07-29 ENCOUNTER — Ambulatory Visit (HOSPITAL_COMMUNITY)
Admission: RE | Admit: 2016-07-29 | Discharge: 2016-07-29 | Disposition: A | Discharge status: Home / Self Care | Payer: 59 | Source: Ambulatory Visit | Attending: Adult Health | Admitting: Adult Health

## 2016-07-29 VITALS — BP 130/70 | HR 76 | Ht 64.0 in | Wt 178.0 lb

## 2016-07-29 DIAGNOSIS — Z1211 Encounter for screening for malignant neoplasm of colon: Secondary | ICD-10-CM | POA: Diagnosis not present

## 2016-07-29 DIAGNOSIS — Z8541 Personal history of malignant neoplasm of cervix uteri: Secondary | ICD-10-CM

## 2016-07-29 DIAGNOSIS — Z1231 Encounter for screening mammogram for malignant neoplasm of breast: Secondary | ICD-10-CM | POA: Insufficient documentation

## 2016-07-29 DIAGNOSIS — Z01411 Encounter for gynecological examination (general) (routine) with abnormal findings: Secondary | ICD-10-CM | POA: Diagnosis not present

## 2016-07-29 DIAGNOSIS — I10 Essential (primary) hypertension: Secondary | ICD-10-CM

## 2016-07-29 DIAGNOSIS — Z01419 Encounter for gynecological examination (general) (routine) without abnormal findings: Secondary | ICD-10-CM

## 2016-07-29 LAB — HEMOCCULT GUIAC POC 1CARD (OFFICE): FECAL OCCULT BLD: NEGATIVE

## 2016-07-29 IMAGING — MG DIGITAL SCREENING BILATERAL MAMMOGRAM WITH CAD
5 series · 5 of 5 positions shown · non-contrast
Comparison: Previous exam(s).

CLINICAL DATA: Screening.

EXAM:
DIGITAL SCREENING BILATERAL MAMMOGRAM WITH CAD

[L CC]
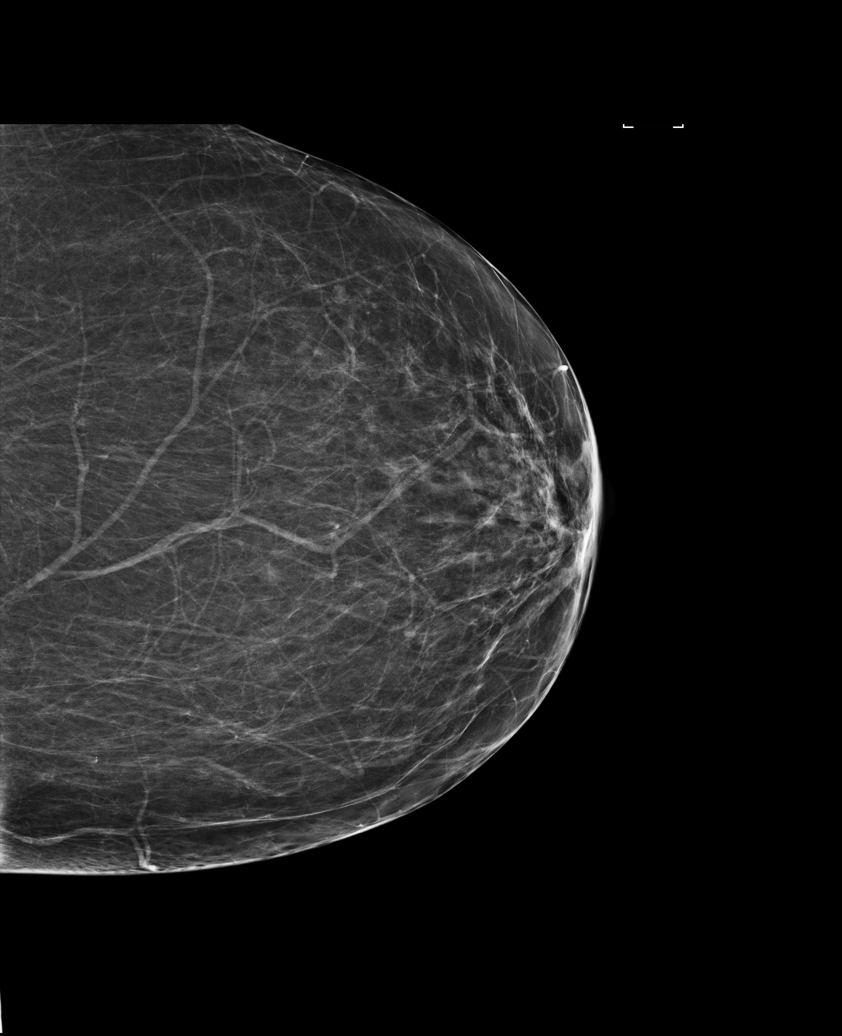

[R CC]
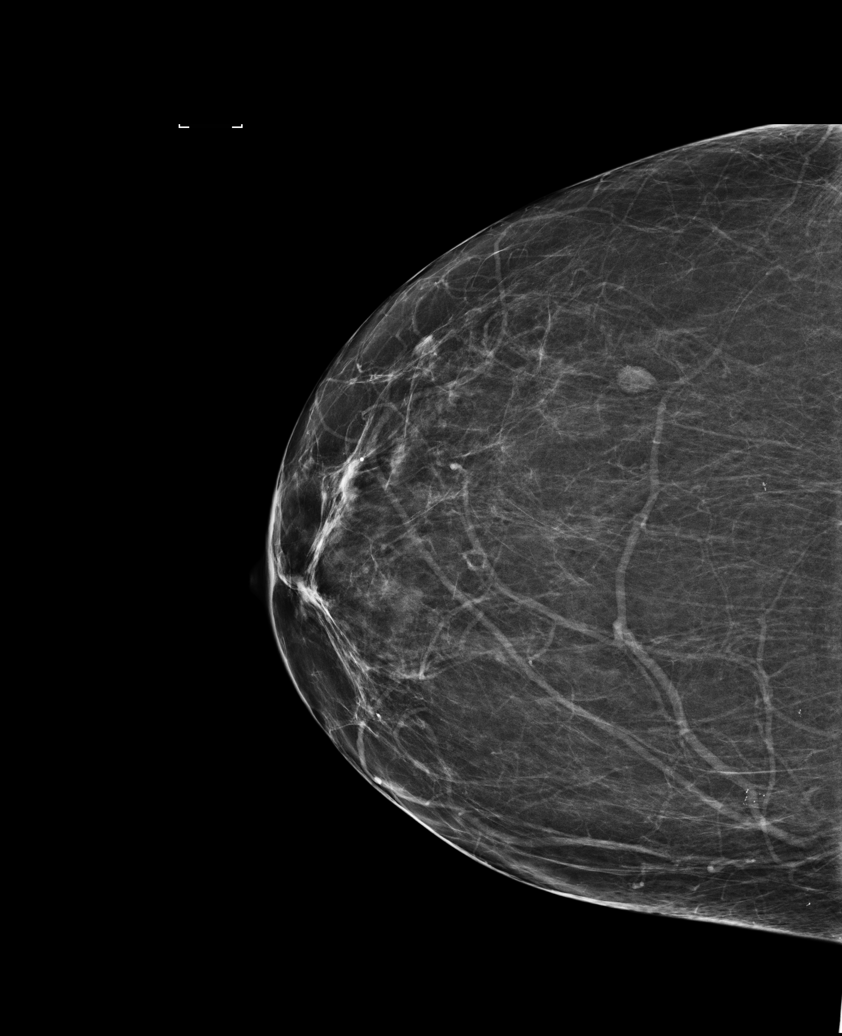

[R MLO]
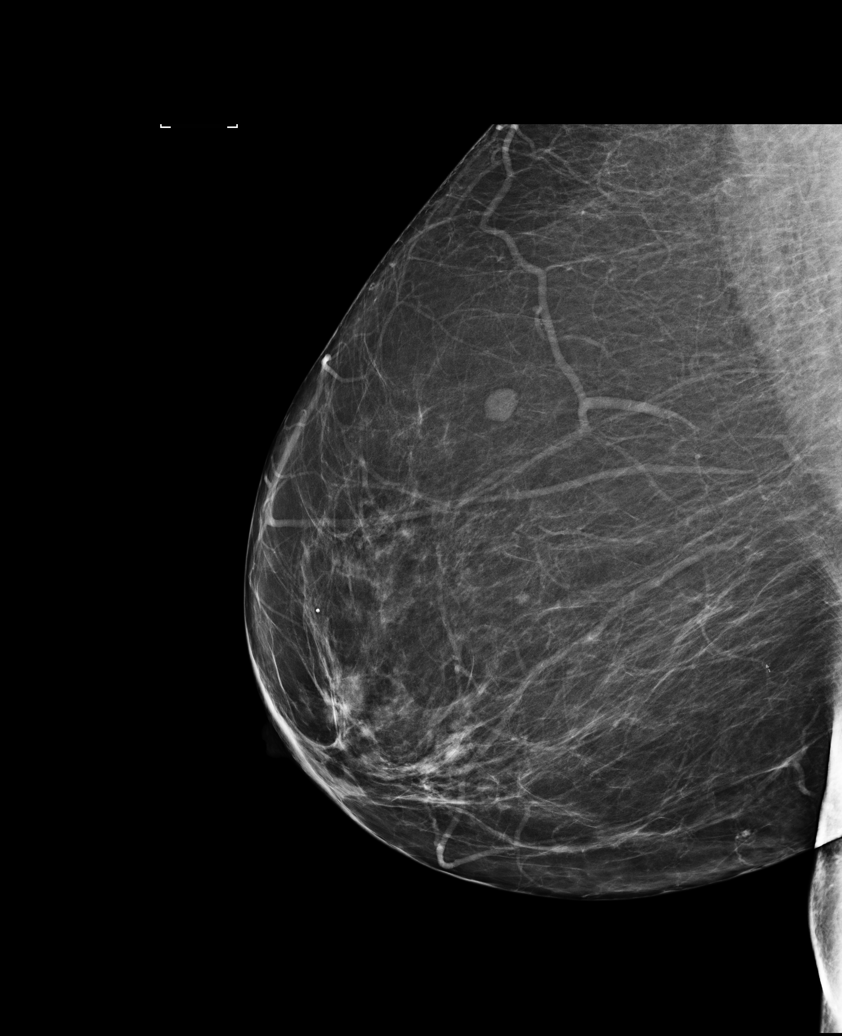

[L MLO (1 of 2)]
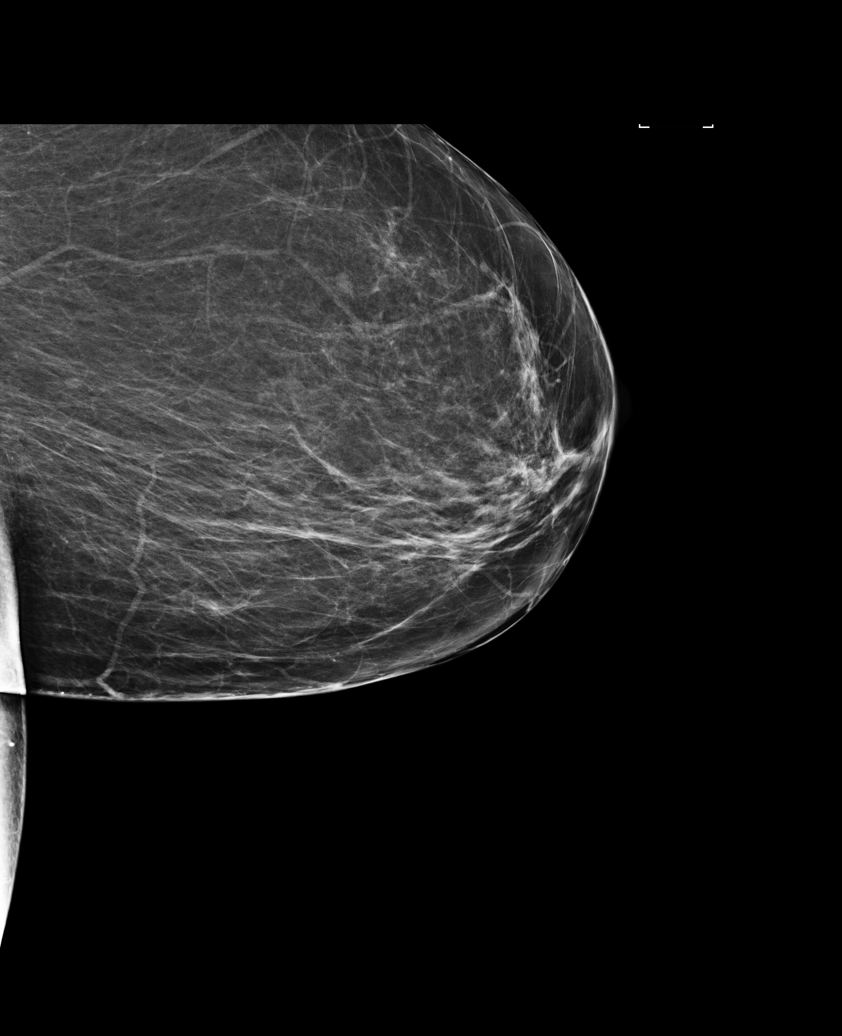

[L MLO (2 of 2)]
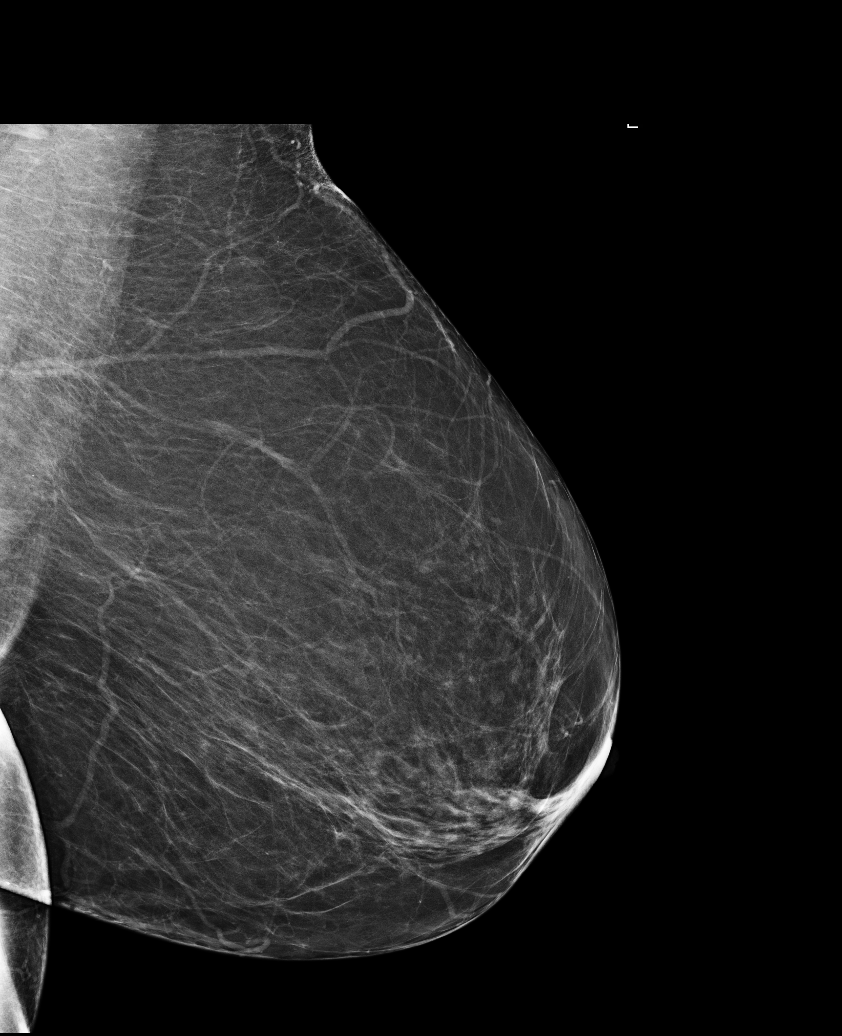

[5 of 5 positions shown; findings below may reference images not displayed]

ACR Breast Density Category b: There are scattered areas of
fibroglandular density.
FINDINGS: There are no findings suspicious for malignancy. Images were
processed with CAD.
IMPRESSION: No mammographic evidence of malignancy. A result letter of this
screening mammogram will be mailed directly to the patient.

RECOMMENDATION:
Screening mammogram in one year. (Code:[US])

BI-RADS CATEGORY  1: Negative.

## 2016-07-29 NOTE — Progress Notes (Signed)
Patient ID: Jacqueline NoeCynthia J Leon, female   DOB: 05-17-1956, 60 y.o.   MRN: [id number] History of Present Illness:  Jacqueline BeechamCynthia is a 60 year old black female,married in for well woman gyn exam, she had normal pap with negative HPV 07/05/15. PCP is Dr Phillips OdorGolding.   Current Medications, Allergies, Past Medical History, Past Surgical History, Family History and Social History were reviewed in [facility] electronic medical record.     Review of Systems: Patient denies any headaches, hearing loss, fatigue, blurred vision, shortness of breath, chest pain, abdominal pain, problems with bowel movements, urination, or intercourse. No joint pain or mood swings.    Physical Exam:BP 130/70 (BP Location: Right Arm, Patient Position: Sitting, Cuff Size: Normal)   Pulse 76   Ht 5\' 4"  (1.626 m)   Wt 178 lb (80.7 kg)   BMI 30.55 kg/m  General:  Well developed, well nourished, no acute distress Skin:  Warm and dry Neck:  Midline trachea, normal thyroid, good ROM, no lymphadenopathy Lungs; Clear to auscultation bilaterally Breast:  No dominant palpable mass, retraction, or nipple discharge Cardiovascular: Regular rate and rhythm Abdomen:  Soft, non tender, no hepatosplenomegaly Pelvic:  External genitalia is normal in appearance, no lesions.  The vagina is normal in appearance. Urethra has no lesions or masses. The cervix and uterus are absent.  No adnexal masses or tenderness noted.Bladder is non tender, no masses felt. Rectal: Good sphincter tone, no polyps, or hemorrhoids felt.  Hemoccult negative. Extremities/musculoskeletal:  No swelling or varicosities noted, no clubbing or cyanosis Psych:  No mood changes, alert and cooperative,seems happy PHQ 2 score 0.  Impression:  1. Well woman exam with routine gynecological exam   2. Essential hypertension   3. History of cervical cancer      Plan:  Check CBC,CMP,TSH and lipids,A1c and vitamin D and HIV and hepatitis C antibody Physical in 1 year   Mammogram yearly F/U with PCP about meds

## 2016-07-29 NOTE — Patient Instructions (Signed)
Physical in 1 year Mammogram yearly F/U with PCP about meds

## 2016-07-30 LAB — COMPREHENSIVE METABOLIC PANEL
A/G RATIO: 1.6 (ref 1.2–2.2)
ALBUMIN: 4.4 g/dL (ref 3.6–4.8)
ALT: 24 IU/L (ref 0–32)
AST: 21 IU/L (ref 0–40)
Alkaline Phosphatase: 65 IU/L (ref 39–117)
BILIRUBIN TOTAL: 0.3 mg/dL (ref 0.0–1.2)
BUN/Creatinine Ratio: 17 (ref 12–28)
BUN: 17 mg/dL (ref 8–27)
CALCIUM: 9.9 mg/dL (ref 8.7–10.3)
CO2: 26 mmol/L (ref 18–29)
CREATININE: 1 mg/dL (ref 0.57–1.00)
Chloride: 100 mmol/L (ref 96–106)
GFR calc Af Amer: 70 mL/min/{1.73_m2} (ref >59)
GFR calc non Af Amer: 61 mL/min/{1.73_m2} (ref >59)
Globulin, Total: 2.7 g/dL (ref 1.5–4.5)
Glucose: 91 mg/dL (ref 65–99)
Potassium: 4.2 mmol/L (ref 3.5–5.2)
SODIUM: 142 mmol/L (ref 134–144)
TOTAL PROTEIN: 7.1 g/dL (ref 6.0–8.5)

## 2016-07-30 LAB — CBC
HEMATOCRIT: 44.7 % (ref 34.0–46.6)
HEMOGLOBIN: 14.9 g/dL (ref 11.1–15.9)
MCH: 30.6 pg (ref 26.6–33.0)
MCHC: 33.3 g/dL (ref 31.5–35.7)
MCV: 92 fL (ref 79–97)
Platelets: 243 10*3/uL (ref 150–379)
RBC: 4.87 x10E6/uL (ref 3.77–5.28)
RDW: 13.7 % (ref 12.3–15.4)
WBC: 6.9 10*3/uL (ref 3.4–10.8)

## 2016-07-30 LAB — TSH: TSH: 2.21 u[IU]/mL (ref 0.450–4.500)

## 2016-07-30 LAB — LIPID PANEL
CHOLESTEROL TOTAL: 200 mg/dL — AB (ref 100–199)
Chol/HDL Ratio: 2.8 ratio units (ref 0.0–4.4)
HDL: 72 mg/dL (ref >39)
LDL Calculated: 115 mg/dL — ABNORMAL HIGH (ref 0–99)
Triglycerides: 66 mg/dL (ref 0–149)
VLDL CHOLESTEROL CAL: 13 mg/dL (ref 5–40)

## 2016-07-30 LAB — HEPATITIS C ANTIBODY

## 2016-07-30 LAB — HEMOGLOBIN A1C
ESTIMATED AVERAGE GLUCOSE: 111 mg/dL
HEMOGLOBIN A1C: 5.5 % (ref 4.8–5.6)

## 2016-07-30 LAB — VITAMIN D 25 HYDROXY (VIT D DEFICIENCY, FRACTURES): VIT D 25 HYDROXY: 42.1 ng/mL (ref 30.0–100.0)

## 2016-07-30 LAB — HIV ANTIBODY (ROUTINE TESTING W REFLEX): HIV SCREEN 4TH GENERATION: NONREACTIVE

## 2016-08-07 ENCOUNTER — Other Ambulatory Visit: Payer: Self-pay | Admitting: Adult Health

## 2017-04-08 ENCOUNTER — Other Ambulatory Visit: Payer: Self-pay | Admitting: Adult Health

## 2017-07-07 ENCOUNTER — Other Ambulatory Visit: Payer: Self-pay | Admitting: Adult Health

## 2017-07-07 DIAGNOSIS — Z1231 Encounter for screening mammogram for malignant neoplasm of breast: Secondary | ICD-10-CM

## 2017-07-11 ENCOUNTER — Observation Stay (HOSPITAL_COMMUNITY): Payer: 59

## 2017-07-11 ENCOUNTER — Emergency Department (HOSPITAL_COMMUNITY): Payer: 59

## 2017-07-11 ENCOUNTER — Encounter (HOSPITAL_COMMUNITY): Payer: Self-pay | Admitting: Cardiology

## 2017-07-11 ENCOUNTER — Observation Stay (HOSPITAL_COMMUNITY)
Admission: EM | Admit: 2017-07-11 | Discharge: 2017-07-14 | Disposition: A | Discharge status: Home / Self Care | Payer: 59 | Attending: Orthopedic Surgery | Admitting: Orthopedic Surgery

## 2017-07-11 ENCOUNTER — Other Ambulatory Visit: Payer: Self-pay

## 2017-07-11 DIAGNOSIS — W000XXA Fall on same level due to ice and snow, initial encounter: Secondary | ICD-10-CM | POA: Diagnosis not present

## 2017-07-11 DIAGNOSIS — S82852D Displaced trimalleolar fracture of left lower leg, subsequent encounter for closed fracture with routine healing: Secondary | ICD-10-CM | POA: Diagnosis present

## 2017-07-11 DIAGNOSIS — I700 Atherosclerosis of aorta: Secondary | ICD-10-CM | POA: Diagnosis not present

## 2017-07-11 DIAGNOSIS — Z87891 Personal history of nicotine dependence: Secondary | ICD-10-CM | POA: Diagnosis not present

## 2017-07-11 DIAGNOSIS — Y93K1 Activity, walking an animal: Secondary | ICD-10-CM | POA: Diagnosis not present

## 2017-07-11 DIAGNOSIS — Y92009 Unspecified place in unspecified non-institutional (private) residence as the place of occurrence of the external cause: Secondary | ICD-10-CM

## 2017-07-11 DIAGNOSIS — R42 Dizziness and giddiness: Secondary | ICD-10-CM | POA: Insufficient documentation

## 2017-07-11 DIAGNOSIS — Z841 Family history of disorders of kidney and ureter: Secondary | ICD-10-CM | POA: Insufficient documentation

## 2017-07-11 DIAGNOSIS — Z90710 Acquired absence of both cervix and uterus: Secondary | ICD-10-CM | POA: Diagnosis not present

## 2017-07-11 DIAGNOSIS — Z8541 Personal history of malignant neoplasm of cervix uteri: Secondary | ICD-10-CM | POA: Diagnosis not present

## 2017-07-11 DIAGNOSIS — Z8249 Family history of ischemic heart disease and other diseases of the circulatory system: Secondary | ICD-10-CM | POA: Diagnosis not present

## 2017-07-11 DIAGNOSIS — W1830XA Fall on same level, unspecified, initial encounter: Secondary | ICD-10-CM | POA: Diagnosis not present

## 2017-07-11 DIAGNOSIS — Z79899 Other long term (current) drug therapy: Secondary | ICD-10-CM | POA: Insufficient documentation

## 2017-07-11 DIAGNOSIS — S99912A Unspecified injury of left ankle, initial encounter: Secondary | ICD-10-CM | POA: Diagnosis present

## 2017-07-11 DIAGNOSIS — E785 Hyperlipidemia, unspecified: Secondary | ICD-10-CM | POA: Insufficient documentation

## 2017-07-11 DIAGNOSIS — R9431 Abnormal electrocardiogram [ECG] [EKG]: Secondary | ICD-10-CM | POA: Diagnosis not present

## 2017-07-11 DIAGNOSIS — Z01818 Encounter for other preprocedural examination: Secondary | ICD-10-CM | POA: Diagnosis not present

## 2017-07-11 DIAGNOSIS — S82892A Other fracture of left lower leg, initial encounter for closed fracture: Secondary | ICD-10-CM

## 2017-07-11 DIAGNOSIS — W19XXXA Unspecified fall, initial encounter: Secondary | ICD-10-CM

## 2017-07-11 DIAGNOSIS — S82852A Displaced trimalleolar fracture of left lower leg, initial encounter for closed fracture: Principal | ICD-10-CM | POA: Insufficient documentation

## 2017-07-11 DIAGNOSIS — I10 Essential (primary) hypertension: Secondary | ICD-10-CM | POA: Insufficient documentation

## 2017-07-11 DIAGNOSIS — S8262XA Displaced fracture of lateral malleolus of left fibula, initial encounter for closed fracture: Secondary | ICD-10-CM | POA: Diagnosis not present

## 2017-07-11 LAB — CBC WITH DIFFERENTIAL/PLATELET
Basophils Absolute: 0 10*3/uL (ref 0.0–0.1)
Basophils Relative: 0 %
EOS ABS: 0.1 10*3/uL (ref 0.0–0.7)
EOS PCT: 1 %
HCT: 45.9 % (ref 36.0–46.0)
Hemoglobin: 14.8 g/dL (ref 12.0–15.0)
LYMPHS ABS: 2.7 10*3/uL (ref 0.7–4.0)
LYMPHS PCT: 35 %
MCH: 30.2 pg (ref 26.0–34.0)
MCHC: 32.2 g/dL (ref 30.0–36.0)
MCV: 93.7 fL (ref 78.0–100.0)
Monocytes Absolute: 0.3 10*3/uL (ref 0.1–1.0)
Monocytes Relative: 4 %
Neutro Abs: 4.7 10*3/uL (ref 1.7–7.7)
Neutrophils Relative %: 60 %
PLATELETS: 236 10*3/uL (ref 150–400)
RBC: 4.9 MIL/uL (ref 3.87–5.11)
RDW: 13.3 % (ref 11.5–15.5)
WBC: 7.9 10*3/uL (ref 4.0–10.5)

## 2017-07-11 LAB — BASIC METABOLIC PANEL
ANION GAP: 9 (ref 5–15)
BUN: 19 mg/dL (ref 6–20)
CALCIUM: 9.5 mg/dL (ref 8.9–10.3)
CO2: 24 mmol/L (ref 22–32)
Chloride: 105 mmol/L (ref 101–111)
Creatinine, Ser: 0.93 mg/dL (ref 0.44–1.00)
GFR calc Af Amer: >60 mL/min (ref >60)
Glucose, Bld: 99 mg/dL (ref 65–99)
POTASSIUM: 3.5 mmol/L (ref 3.5–5.1)
SODIUM: 138 mmol/L (ref 135–145)

## 2017-07-11 LAB — SURGICAL PCR SCREEN
MRSA, PCR: NEGATIVE
STAPHYLOCOCCUS AUREUS: NEGATIVE

## 2017-07-11 LAB — TROPONIN I: Troponin I: <0.03 ng/mL (ref <0.03)

## 2017-07-11 LAB — PROTIME-INR
INR: 0.89
Prothrombin Time: 12 seconds (ref 11.4–15.2)

## 2017-07-11 IMAGING — DX DG ANKLE COMPLETE 3+V*L*
3 series · 3 of 3 positions shown · non-contrast
Comparison: None.

CLINICAL DATA: Left ankle injury today when the patient slipped in
the snow. Initial encounter.

EXAM:
LEFT ANKLE COMPLETE - 3+ VIEW

[ankle ap]
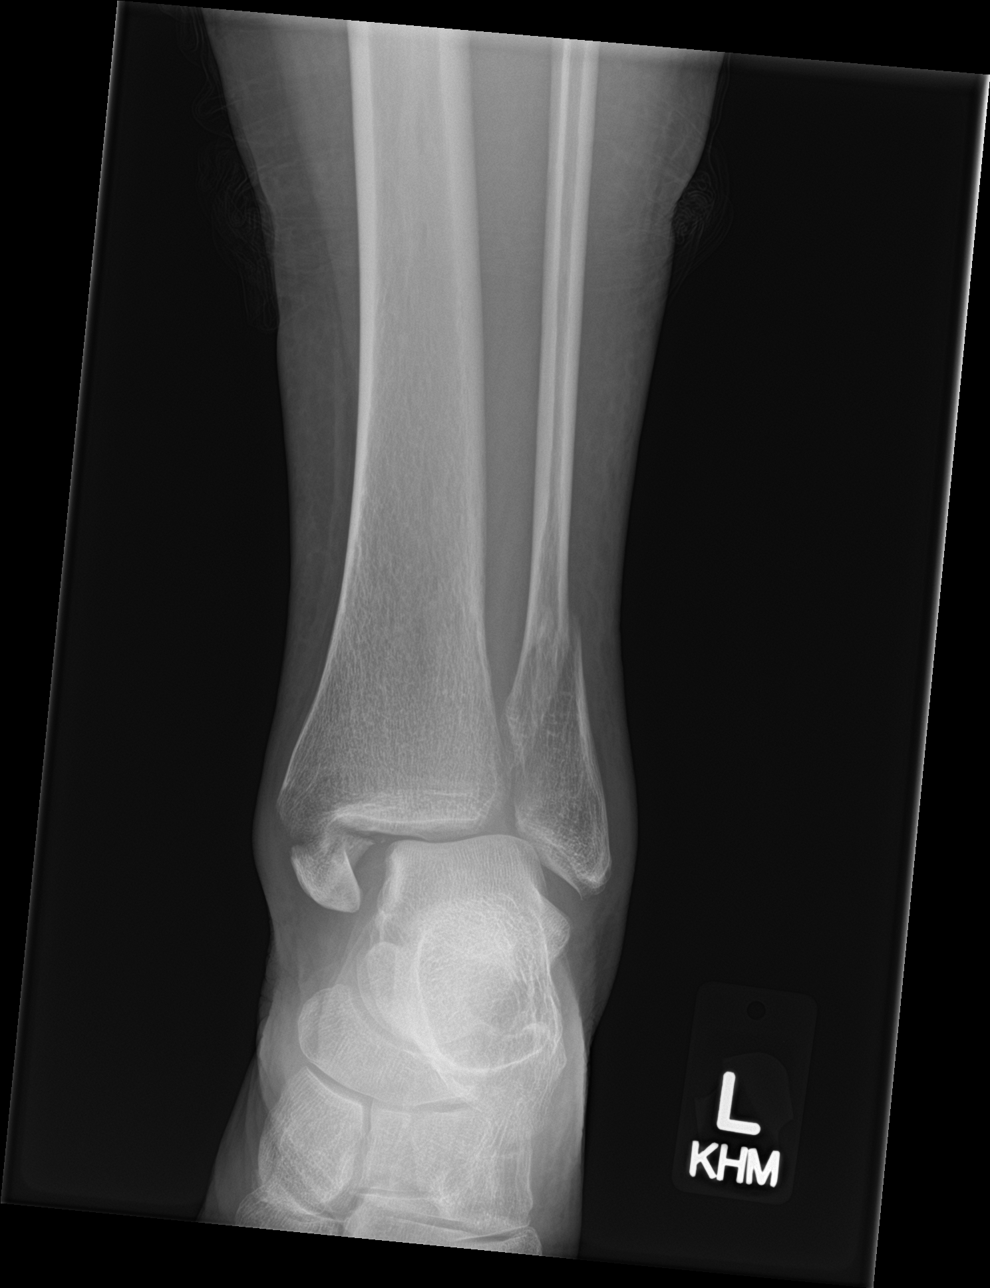

[ankle obl]
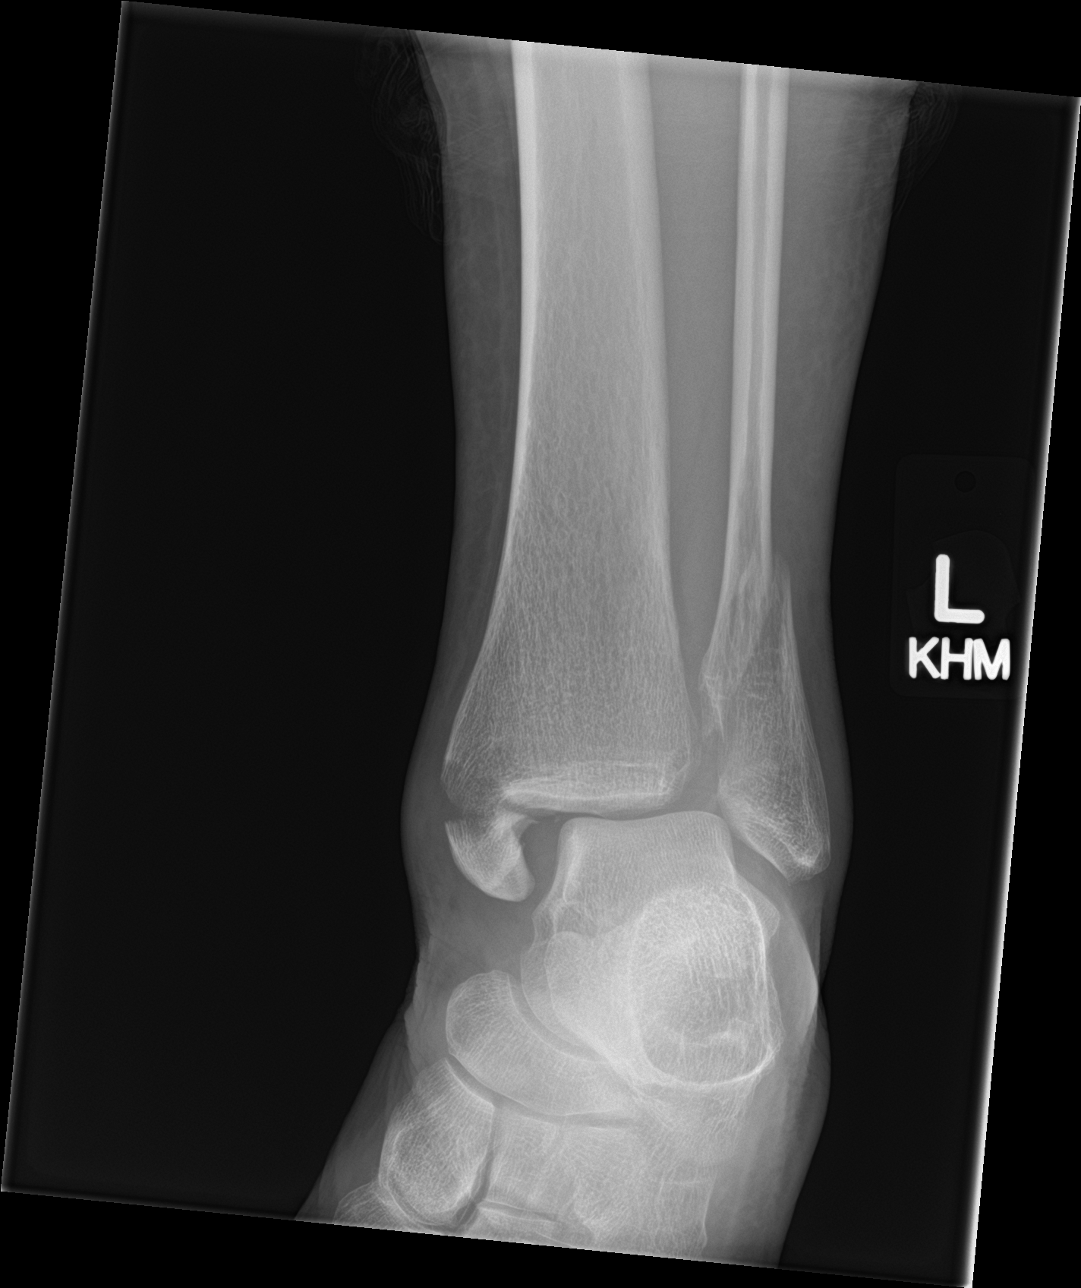

[ankle lat]
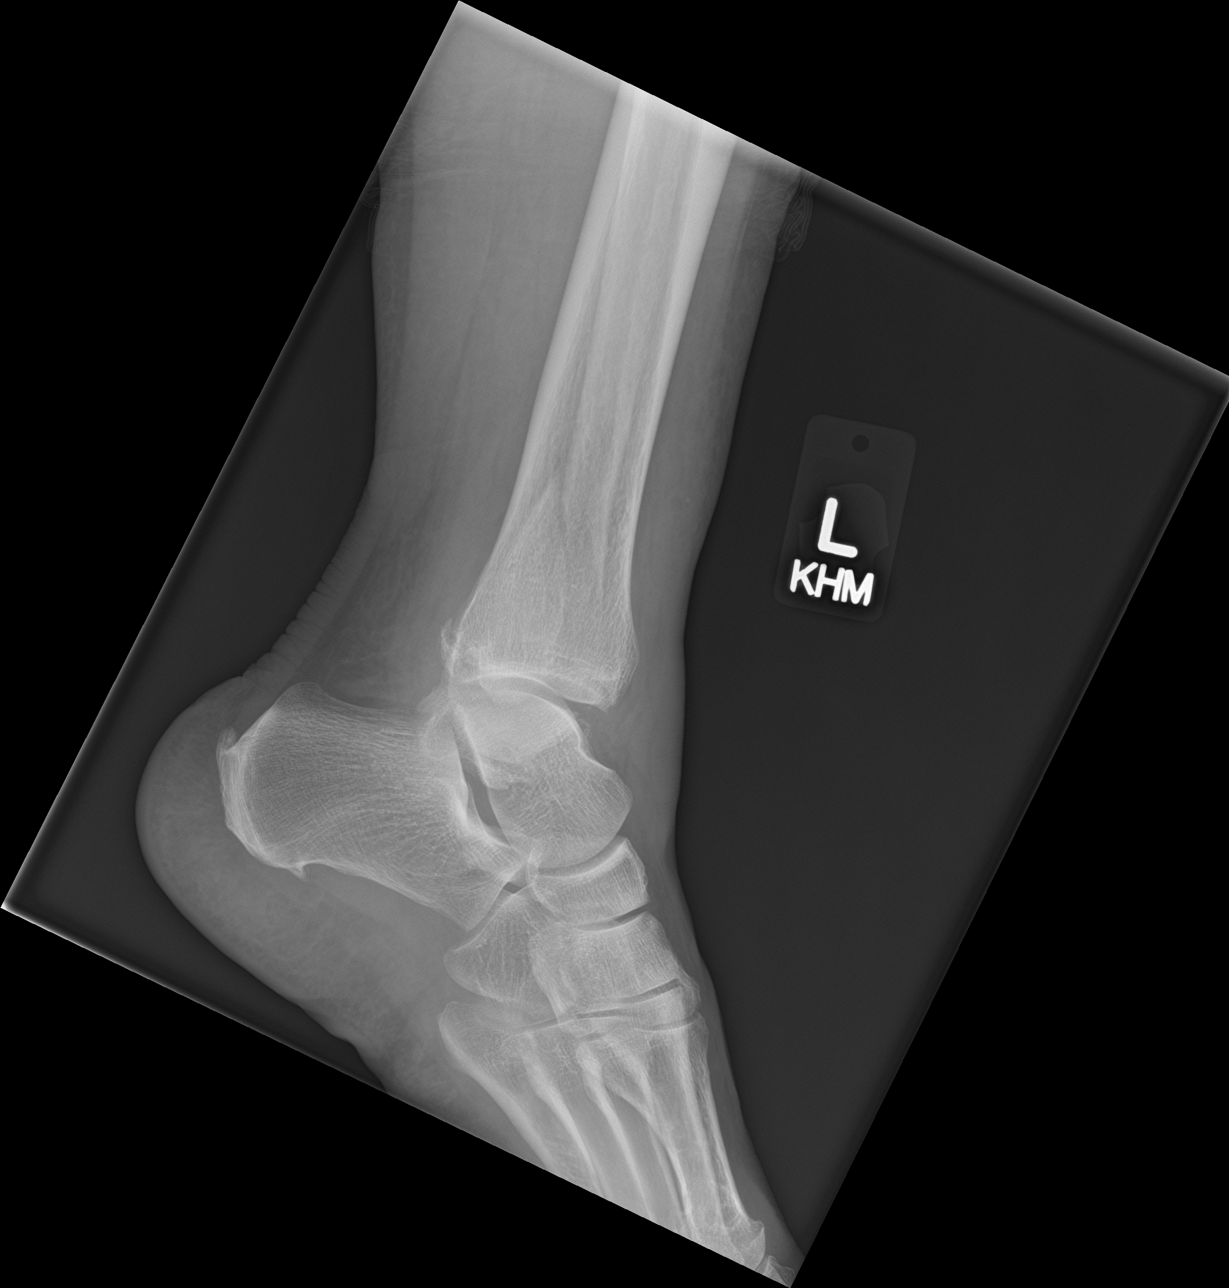

[3 of 3 positions shown; findings below may reference images not displayed]

FINDINGS: The patient has acute posterior, medial and lateral malleolar
fractures. The fracture of the posterior malleolus appears to be a
slightly distracted chip fracture. The lateral malleolar fracture is
oblique in orientation with mild posterior displacement of the
distal fragment. Medial malleolar fracture demonstrates [DATE] shaft
width medial displacement. The talus is laterally subluxed
approximately 1 cm at the tibiotalar joint.
IMPRESSION: Trimalleolar fracture with associated lateral subluxation of the
talus.

## 2017-07-11 IMAGING — CR DG CHEST 1V PORT
1 series · 1 of 1 positions shown · non-contrast
Comparison: None.

CLINICAL DATA: Preoperative examination for patient with a left
ankle fracture suffered in a slip and fall today.

EXAM:
PORTABLE CHEST 1 VIEW

[portable]
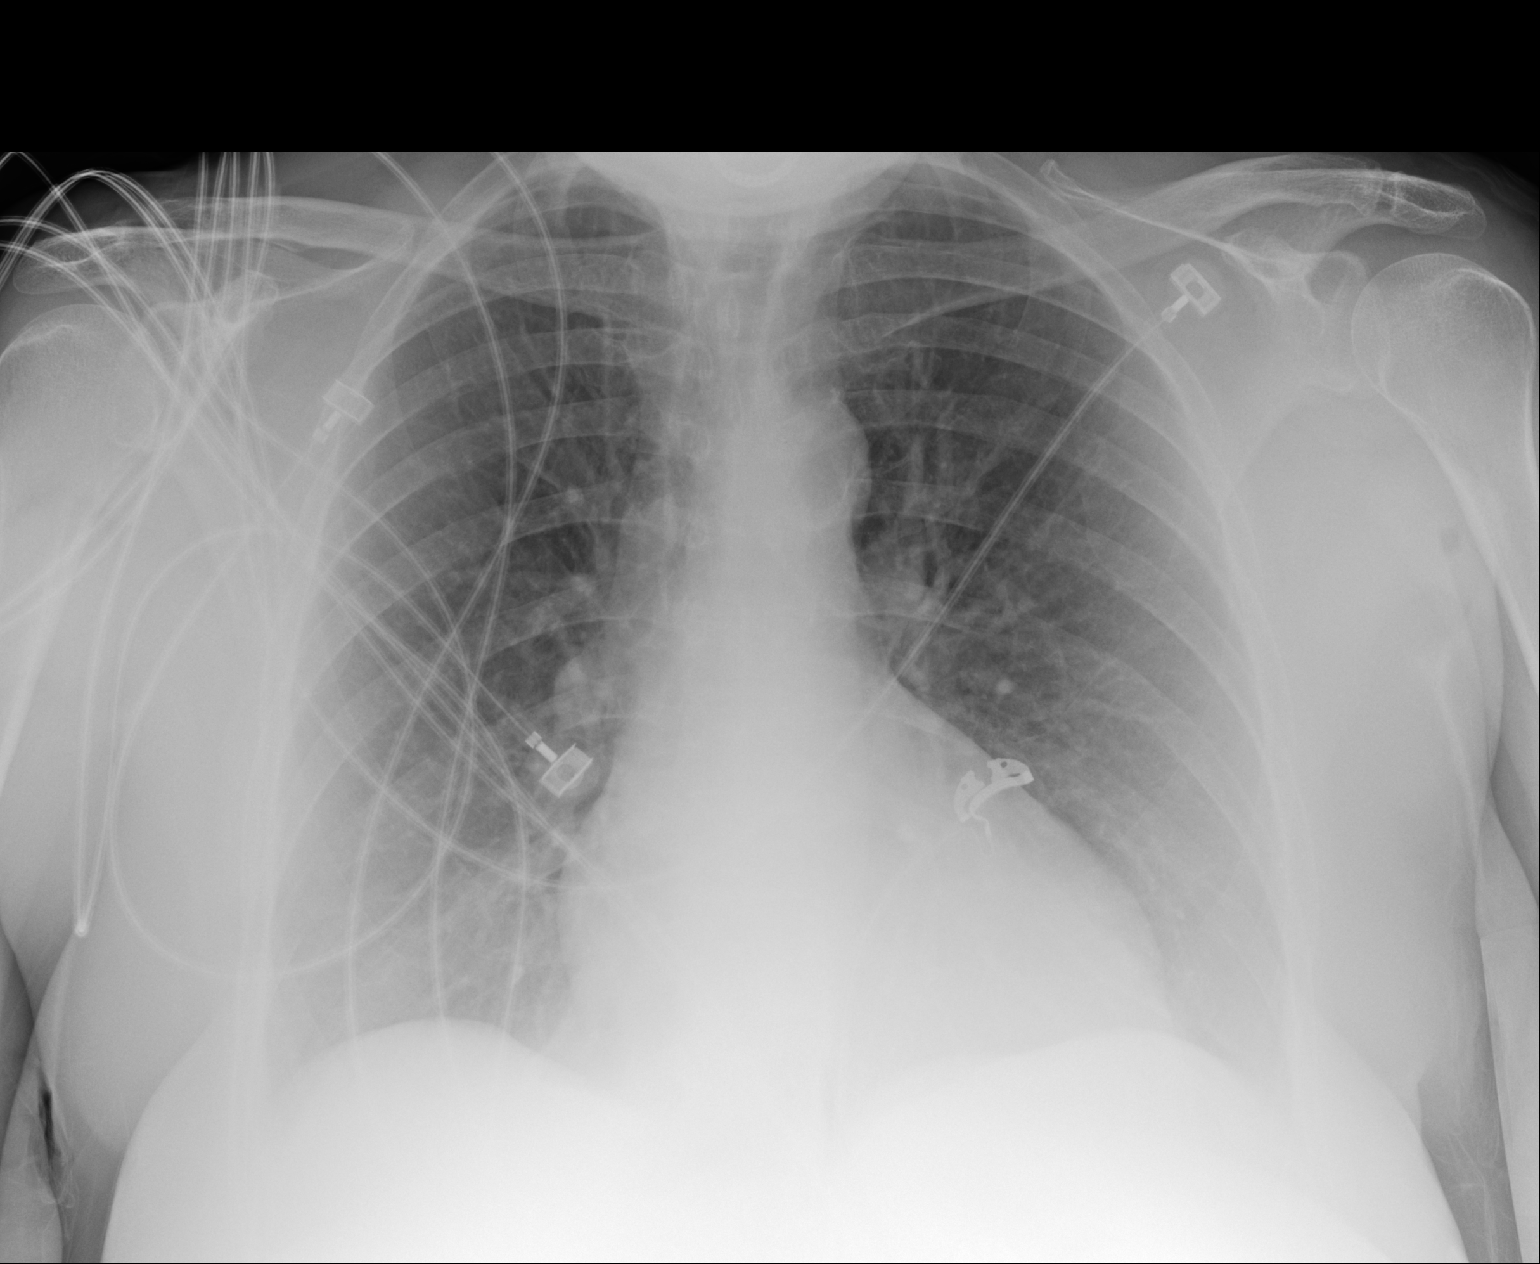

[1 of 1 positions shown; findings below may reference images not displayed]

FINDINGS: Lungs are clear. Heart size is normal. No pneumothorax or pleural
effusion. Aortic atherosclerosis is noted. No bony abnormality.
IMPRESSION: No acute disease.

Atherosclerosis.

## 2017-07-11 MED ORDER — MORPHINE SULFATE (PF) 4 MG/ML IV SOLN
4.0000 mg | INTRAVENOUS | Status: DC | PRN
  Administered 2017-07-11: 4 mg via INTRAVENOUS
  Filled 2017-07-11: qty 1

## 2017-07-11 MED ORDER — HYDROCHLOROTHIAZIDE 12.5 MG PO CAPS
12.5000 mg | ORAL_CAPSULE | Freq: Every day | ORAL | Status: DC
  Administered 2017-07-11 – 2017-07-14 (×4): 12.5 mg via ORAL
  Filled 2017-07-11 (×4): qty 1

## 2017-07-11 MED ORDER — SIMVASTATIN 20 MG PO TABS
20.0000 mg | ORAL_TABLET | Freq: Every day | ORAL | Status: DC
  Administered 2017-07-11 – 2017-07-13 (×3): 20 mg via ORAL
  Filled 2017-07-11 (×3): qty 1

## 2017-07-11 MED ORDER — HYDROMORPHONE HCL 1 MG/ML IJ SOLN
0.5000 mg | INTRAMUSCULAR | Status: AC | PRN
  Administered 2017-07-11: 0.5 mg via INTRAVENOUS
  Filled 2017-07-11: qty 1

## 2017-07-11 MED ORDER — VITAMIN D 1000 UNITS PO TABS: 1000.0000 [IU] | ORAL_TABLET | Freq: Every day | ORAL | Status: DC

## 2017-07-11 MED ORDER — ONDANSETRON HCL 4 MG/2ML IJ SOLN: 4.0000 mg | Freq: Three times a day (TID) | INTRAMUSCULAR | Status: AC | PRN

## 2017-07-11 MED ORDER — VITAMIN D 1000 UNITS PO TABS
1000.0000 [IU] | ORAL_TABLET | Freq: Every day | ORAL | Status: DC
  Administered 2017-07-11 – 2017-07-14 (×4): 1000 [IU] via ORAL
  Filled 2017-07-11 (×4): qty 1

## 2017-07-11 MED ORDER — MECLIZINE HCL 12.5 MG PO TABS: 25.0000 mg | ORAL_TABLET | Freq: Every day | ORAL | Status: DC | PRN

## 2017-07-11 NOTE — H&P (Signed)
Jacqueline Leon is an 61 y.o. female.   Chief Complaint: left ankle pain  HPI: 61 yo female h/o htn fell walking her dog. She heard a pop and then felt pain left ankle; she couldn't bear weight.  (left ankle pain x hrs, mild, dull, constant, non radiating)  Past Medical History:  Diagnosis Date  . Abnormal Pap smear   . History of cervical cancer   . Hyperlipidemia   . Hypertension   . Inner ear inflammation   . Vaginal Pap smear, abnormal     Past Surgical History:  Procedure Laterality Date  . ABDOMINAL HYSTERECTOMY      Family History  Problem Relation Age of Onset  . Hypertension Mother   . Other Mother        passed away from childbirth  . Hypertension Father   . Hypertension Maternal Grandfather   . Other Brother        had a pacemaker  . Kidney disease Brother   . Other Daughter        on dialysis   Social History:  reports that she quit smoking about 10 years ago. Her smoking use included cigarettes. she has never used smokeless tobacco. She reports that she drinks alcohol. She reports that she does not use drugs.  Allergies: No Known Allergies  Medications Prior to Admission  Medication Sig Dispense Refill  . cholecalciferol (VITAMIN D) 1000 UNITS tablet Take 1,000 Units by mouth daily.    . hydrochlorothiazide (MICROZIDE) 12.5 MG capsule TAKE ONE CAPSULE BY MOUTH DAILY. 30 capsule 6  . meclizine (ANTIVERT) 25 MG tablet Take 25 mg by mouth daily as needed for dizziness.     . naproxen sodium (ALEVE) 220 MG tablet Take 440 mg by mouth daily as needed (pain).    . simvastatin (ZOCOR) 20 MG tablet TAKE ONE TABLET BY MOUTH ONCE A DAY AT BEDTIME FOR CHOLESTEROL. 30 tablet 11    Results for orders placed or performed during the hospital encounter of 07/11/17 (from the past 48 hour(s))  Basic metabolic panel     Status: None   Collection Time: 07/11/17 11:25 AM  Result Value Ref Range   Sodium 138 135 - 145 mmol/L   Potassium 3.5 3.5 - 5.1 mmol/L   Chloride 105  101 - 111 mmol/L   CO2 24 22 - 32 mmol/L   Glucose, Bld 99 65 - 99 mg/dL   BUN 19 6 - 20 mg/dL   Creatinine, Ser 0.93 0.44 - 1.00 mg/dL   Calcium 9.5 8.9 - 10.3 mg/dL   GFR calc non Af Amer >60 >60 mL/min   GFR calc Af Amer >60 >60 mL/min    Comment: (NOTE) The eGFR has been calculated using the CKD EPI equation. This calculation has not been validated in all clinical situations. eGFR's persistently <60 mL/min signify possible Chronic Kidney Disease.    Anion gap 9 5 - 15  Troponin I     Status: None   Collection Time: 07/11/17 11:25 AM  Result Value Ref Range   Troponin I <0.03 <0.03 ng/mL  Protime-INR     Status: None   Collection Time: 07/11/17 11:25 AM  Result Value Ref Range   Prothrombin Time 12.0 11.4 - 15.2 seconds   INR 0.89   CBC with Differential/Platelet     Status: None   Collection Time: 07/11/17 11:25 AM  Result Value Ref Range   WBC 7.9 4.0 - 10.5 K/uL   RBC 4.90 3.87 - 5.11  MIL/uL   Hemoglobin 14.8 12.0 - 15.0 g/dL   HCT 45.9 36.0 - 46.0 %   MCV 93.7 78.0 - 100.0 fL   MCH 30.2 26.0 - 34.0 pg   MCHC 32.2 30.0 - 36.0 g/dL   RDW 13.3 11.5 - 15.5 %   Platelets 236 150 - 400 K/uL   Neutrophils Relative % 60 %   Neutro Abs 4.7 1.7 - 7.7 K/uL   Lymphocytes Relative 35 %   Lymphs Abs 2.7 0.7 - 4.0 K/uL   Monocytes Relative 4 %   Monocytes Absolute 0.3 0.1 - 1.0 K/uL   Eosinophils Relative 1 %   Eosinophils Absolute 0.1 0.0 - 0.7 K/uL   Basophils Relative 0 %   Basophils Absolute 0.0 0.0 - 0.1 K/uL   Dg Ankle Complete Left  Result Date: 07/11/2017 CLINICAL DATA:  Left ankle injury today when the patient slipped in the snow. Initial encounter. EXAM: LEFT ANKLE COMPLETE - 3+ VIEW COMPARISON:  None. FINDINGS: The patient has acute posterior, medial and lateral malleolar fractures. The fracture of the posterior malleolus appears to be a slightly distracted chip fracture. The lateral malleolar fracture is oblique in orientation with mild posterior displacement of  the distal fragment. Medial malleolar fracture demonstrates 1/2 shaft width medial displacement. The talus is laterally subluxed approximately 1 cm at the tibiotalar joint. IMPRESSION: Trimalleolar fracture with associated lateral subluxation of the talus. Electronically Signed   By: Inge Rise M.D.   On: 07/11/2017 11:48   Dg Chest Port 1 View  Result Date: 07/11/2017 CLINICAL DATA:  Preoperative examination for patient with a left ankle fracture suffered in a slip and fall today. EXAM: PORTABLE CHEST 1 VIEW COMPARISON:  None. FINDINGS: Lungs are clear. Heart size is normal. No pneumothorax or pleural effusion. Aortic atherosclerosis is noted. No bony abnormality. IMPRESSION: No acute disease. Atherosclerosis. Electronically Signed   By: Inge Rise M.D.   On: 07/11/2017 13:03    Review of Systems  Respiratory: Negative for shortness of breath.   Cardiovascular: Negative for chest pain.  Musculoskeletal: Negative.   Neurological: Negative for tingling.  All other systems reviewed and are negative.   Blood pressure (!) 123/93, pulse 73, temperature 98.6 F (37 C), temperature source Oral, resp. rate 18, height _0  (1.626 m), weight 170 lb (77.1 kg), SpO2 98 %. Physical Exam  Constitutional: She is oriented to person, place, and time. She appears well-nourished.  Eyes: Right eye exhibits no discharge. Left eye exhibits no discharge. No scleral icterus.  Neck: Neck supple. No JVD present. No tracheal deviation present.  Cardiovascular: Intact distal pulses.  Respiratory: Effort normal. No stridor.  GI: Soft. She exhibits no distension.  Musculoskeletal:       Right shoulder: Normal. She exhibits normal range of motion, no tenderness, no bony tenderness, no swelling, no effusion, no deformity, no laceration, no pain, no spasm, normal pulse and normal strength.       Left shoulder: She exhibits normal range of motion, no tenderness, no bony tenderness, no swelling, no effusion, no  deformity, no laceration, no pain, no spasm, normal pulse and normal strength.       Right lower leg: Normal. She exhibits no tenderness, no bony tenderness, no swelling, no edema, no deformity and no laceration.       Left lower leg: She exhibits tenderness and bony tenderness. She exhibits no swelling, no edema, no deformity and no laceration.  ALL JOINTS LEFT AND RIGHT UPPER EXTREMITY STABLE  RIGHT  LOWER EXTRM JOINTS STABLE   LEFT HIP AND KNEE STABLE   Neurological: She is alert and oriented to person, place, and time. She has normal reflexes. She exhibits normal muscle tone. Coordination normal.  Skin: Skin is warm and dry. No rash noted. No erythema. No pallor.  Psychiatric: She has a normal mood and affect. Her behavior is normal. Thought content normal.     Assessment/Plan CLOSED LEFT ANKLE FRACTURE TRIMALLEOLAR  ORIF LEFT ANKLE   The procedure has been fully reviewed with the patient; The risks and benefits of surgery have been discussed and explained and understood. Alternative treatment has also been reviewed, questions were encouraged and answered. The postoperative plan is also been reviewed.   Arther Abbott, MD 07/11/2017, 3:21 PM

## 2017-07-11 NOTE — ED Triage Notes (Signed)
Fell in snow .  C/o pain to left ankle.

## 2017-07-11 NOTE — ED Provider Notes (Signed)
[facility] EMERGENCY DEPARTMENT Provider Note   CSN: [id number] Arrival date & time: 07/11/17  1105     History   Chief Complaint Chief Complaint  Patient presents with  . Fall    HPI Jacqueline Leon is a 61 y.o. female.  HPI  Pt was seen at 1115. Per pt, c/o sudden onset and persistence of constant left ankle "pain" that began after she fell in the snow PTA. Pt denies any other injuries. Denies left hip/knee/foot pain, no head injury, no neck or back pain, no abd pain, no CP/SOB, no focal motor weakness, no tingling/numbness in extremities.  Past Medical History:  Diagnosis Date  . Abnormal Pap smear   . History of cervical cancer   . Hyperlipidemia   . Hypertension   . Inner ear inflammation   . Vaginal Pap smear, abnormal     Patient Active Problem List   Diagnosis Date Noted  . History of cervical cancer 06/02/2013  . DEGENERATIVE JOINT DISEASE, RIGHT KNEE 09/09/2010  . DERANGEMENT OF POSTERIOR HORN OF MEDIAL MENISCUS 09/09/2010  . ANKLE PAIN 09/12/2008    Past Surgical History:  Procedure Laterality Date  . ABDOMINAL HYSTERECTOMY      OB History    Gravida Para Term Preterm AB Living   2 2       2    SAB TAB Ectopic Multiple Live Births           2       Home Medications    Prior to Admission medications   Medication Sig Start Date End Date Taking? Authorizing Provider  cholecalciferol (VITAMIN D) 1000 UNITS tablet Take 1,000 Units by mouth daily.   Yes [provider]  hydrochlorothiazide (MICROZIDE) 12.5 MG capsule TAKE ONE CAPSULE BY MOUTH DAILY. 04/08/17  Yes Adline PotterGriffin, Jennifer A, NP  meclizine (ANTIVERT) 25 MG tablet Take 25 mg by mouth daily as needed for dizziness.    Yes [provider]  naproxen sodium (ALEVE) 220 MG tablet Take 440 mg by mouth daily as needed (pain).   Yes [provider]  simvastatin (ZOCOR) 20 MG tablet TAKE ONE TABLET BY MOUTH ONCE A DAY AT BEDTIME FOR CHOLESTEROL. 08/10/16  Yes Adline PotterGriffin, Jennifer A,  NP    Family History Family History  Problem Relation Age of Onset  . Hypertension Mother   . Other Mother        passed away from childbirth  . Hypertension Father   . Hypertension Maternal Grandfather   . Other Brother        had a pacemaker  . Kidney disease Brother   . Other Daughter        on dialysis    Social History Social History   Tobacco Use  . Smoking status: Former Smoker    Types: Cigarettes    Last attempt to quit: 08/03/2006    Years since quitting: 10.9  . Smokeless tobacco: Never Used  Substance Use Topics  . Alcohol use: Yes    Comment: occassional  . Drug use: No     Allergies   Patient has no known allergies.   Review of Systems Review of Systems ROS: Statement: All systems negative except as marked or noted in the HPI; Constitutional: Negative for fever and chills. ; ; Eyes: Negative for eye pain, redness and discharge. ; ; ENMT: Negative for ear pain, hoarseness, nasal congestion, sinus pressure and sore throat. ; ; Cardiovascular: Negative for chest pain, palpitations, diaphoresis, dyspnea and peripheral edema. ; ;  Respiratory: Negative for cough, wheezing and stridor. ; ; Gastrointestinal: Negative for nausea, vomiting, diarrhea, abdominal pain, blood in stool, hematemesis, jaundice and rectal bleeding. . ; ; Genitourinary: Negative for dysuria, flank pain and hematuria. ; ; Musculoskeletal: Negative for back pain and neck pain. +left ankle pain. .; ; Skin: Negative for pruritus, rash, abrasions, blisters, bruising and skin lesion.; ; Neuro: Negative for headache, lightheadedness and neck stiffness. Negative for weakness, altered level of consciousness, altered mental status, extremity weakness, paresthesias, involuntary movement, seizure and syncope.       Physical Exam Updated Vital Signs BP 131/65 (BP Location: Left Arm)   Pulse 86   Temp 98.8 F (37.1 C) (Oral)   Resp 20   Ht 5\' 4"  (1.626 m)   Wt 77.1 kg (170 lb)   SpO2 100%   BMI  29.18 kg/m   Physical Exam 1120: Physical examination:  Nursing notes reviewed; Vital signs and O2 SAT reviewed;  Constitutional: Well developed, Well nourished, Well hydrated, In no acute distress; Head:  Normocephalic, atraumatic; Eyes: EOMI, PERRL, No scleral icterus; ENMT: Mouth and pharynx normal, Mucous membranes moist; Neck: Supple, Full range of motion, No lymphadenopathy; Cardiovascular: Regular rate and rhythm, No gallop; Respiratory: Breath sounds clear & equal bilaterally, No wheezes.  Speaking full sentences with ease, Normal respiratory effort/excursion; Chest: Nontender, Movement normal; Abdomen: Soft, Nontender, Nondistended, Normal bowel sounds; Genitourinary: No CVA tenderness; Extremities: Pulses normal, NT left hip/knee/foot. +right ankle edema, tenderness, no open wounds, no erythema. Strong palp right pedal pulse. No calf tenderness, edema or asymmetry.; Neuro: AA&Ox3, Major CN grossly intact.  Speech clear. No gross focal motor or sensory deficits in extremities.; Skin: Color normal, Warm, Dry.   ED Treatments / Results  Labs (all labs ordered are listed, but only abnormal results are displayed)   EKG  EKG Interpretation  Date/Time:  Sunday July 11 2017 12:46:23 EST Ventricular Rate:  71 PR Interval:    QRS Duration: 93 QT Interval:  398 QTC Calculation: 433 R Axis:   -32 Text Interpretation:  Sinus rhythm Left axis deviation Low voltage, precordial leads No old tracing to compare Confirmed by Samuel JesterMcManus, Masyn Rostro [phone]) on 07/11/2017 12:54:04 PM       Radiology   Procedures Procedures (including critical care time)   SPLINT APPLICATION Date/Time: 1300 Authorized by: Laray AngerMCMANUS,Leland Raver M Consent: Verbal consent obtained. Risks and benefits: risks, benefits and alternatives were discussed Consent given by: patient Splint applied by: Multiple ED RN's.  Location details: left lower leg Splint type: short leg posterior splint with sugar tong  ("U") Post-procedure: The splinted body part was neurovascularly unchanged following the procedure. Strong pedal pulses were palp before and after splinting.  Patient tolerance: Patient tolerated the procedure well with no immediate complications.     Medications Ordered in ED Medications  morphine 4 MG/ML injection 4 mg (not administered)     Initial Impression / Assessment and Plan / ED Course  I have reviewed the triage vital signs and the nursing notes.  Pertinent labs & imaging results that were available during my care of the patient were reviewed by me and considered in my medical decision making (see chart for details).  MDM Reviewed: previous chart, nursing note and vitals Interpretation: x-ray, ECG and labs   Results for orders placed or performed during the hospital encounter of 07/11/17  Basic metabolic panel  Result Value Ref Range   Sodium 138 135 - 145 mmol/L   Potassium 3.5 3.5 - 5.1 mmol/L   Chloride  105 101 - 111 mmol/L   CO2 24 22 - 32 mmol/L   Glucose, Bld 99 65 - 99 mg/dL   BUN 19 6 - 20 mg/dL   Creatinine, Ser 1.61 0.44 - 1.00 mg/dL   Calcium 9.5 8.9 - 09.6 mg/dL   GFR calc non Af Amer >60 >60 mL/min   GFR calc Af Amer >60 >60 mL/min   Anion gap 9 5 - 15  Troponin I  Result Value Ref Range   Troponin I <0.03 <0.03 ng/mL  Protime-INR  Result Value Ref Range   Prothrombin Time 12.0 11.4 - 15.2 seconds   INR 0.89   CBC with Differential/Platelet  Result Value Ref Range   WBC 7.9 4.0 - 10.5 K/uL   RBC 4.90 3.87 - 5.11 MIL/uL   Hemoglobin 14.8 12.0 - 15.0 g/dL   HCT 04.5 40.9 - 81.1 %   MCV 93.7 78.0 - 100.0 fL   MCH 30.2 26.0 - 34.0 pg   MCHC 32.2 30.0 - 36.0 g/dL   RDW 91.4 78.2 - 95.6 %   Platelets 236 150 - 400 K/uL   Neutrophils Relative % 60 %   Neutro Abs 4.7 1.7 - 7.7 K/uL   Lymphocytes Relative 35 %   Lymphs Abs 2.7 0.7 - 4.0 K/uL   Monocytes Relative 4 %   Monocytes Absolute 0.3 0.1 - 1.0 K/uL   Eosinophils Relative 1 %    Eosinophils Absolute 0.1 0.0 - 0.7 K/uL   Basophils Relative 0 %   Basophils Absolute 0.0 0.0 - 0.1 K/uL    Dg Ankle Complete Left Result Date: 07/11/2017 CLINICAL DATA:  Left ankle injury today when the patient slipped in the snow. Initial encounter. EXAM: LEFT ANKLE COMPLETE - 3+ VIEW COMPARISON:  None. FINDINGS: The patient has acute posterior, medial and lateral malleolar fractures. The fracture of the posterior malleolus appears to be a slightly distracted chip fracture. The lateral malleolar fracture is oblique in orientation with mild posterior displacement of the distal fragment. Medial malleolar fracture demonstrates 1/2 shaft width medial displacement. The talus is laterally subluxed approximately 1 cm at the tibiotalar joint. IMPRESSION: Trimalleolar fracture with associated lateral subluxation of the talus. Electronically Signed   By: Drusilla Kanner M.D.   On: 07/11/2017 11:48   Dg Chest Port 1 View Result Date: 07/11/2017 CLINICAL DATA:  Preoperative examination for patient with a left ankle fracture suffered in a slip and fall today. EXAM: PORTABLE CHEST 1 VIEW COMPARISON:  None. FINDINGS: Lungs are clear. Heart size is normal. No pneumothorax or pleural effusion. Aortic atherosclerosis is noted. No bony abnormality. IMPRESSION: No acute disease. Atherosclerosis. Electronically Signed   By: Drusilla Kanner M.D.   On: 07/11/2017 13:03    1220:  XR as above.  T/C to Ortho Dr. Romeo Apple, case discussed, including:  HPI, pertinent PM/SHx, VS/PE, dx testing, ED course and treatment:  requests to place splint, obtain pre-op labs/CXR/EKG, agreeable to admit to his service for OR repair tomorrow. Dx and testing, as well as d/w Ortho MD, d/w pt and family.  Questions answered.  Verb understanding, agreeable to admit.    Final Clinical Impressions(s) / ED Diagnoses   Final diagnoses:  None    ED Discharge Orders    None       Samuel Jester, DO 07/14/17 2001

## 2017-07-11 NOTE — ED Notes (Signed)
Positive dp pulse left foot

## 2017-07-12 ENCOUNTER — Observation Stay (HOSPITAL_COMMUNITY): Payer: 59 | Admitting: Anesthesiology

## 2017-07-12 ENCOUNTER — Observation Stay (HOSPITAL_COMMUNITY): Payer: 59

## 2017-07-12 ENCOUNTER — Encounter (HOSPITAL_COMMUNITY)
Admission: EM | Disposition: A | Discharge status: Home / Self Care | Payer: Self-pay | Source: Home / Self Care | Attending: Emergency Medicine

## 2017-07-12 DIAGNOSIS — S8292XA Unspecified fracture of left lower leg, initial encounter for closed fracture: Secondary | ICD-10-CM | POA: Diagnosis not present

## 2017-07-12 DIAGNOSIS — S82852A Displaced trimalleolar fracture of left lower leg, initial encounter for closed fracture: Secondary | ICD-10-CM | POA: Diagnosis not present

## 2017-07-12 HISTORY — PX: ORIF ANKLE FRACTURE: SHX5408

## 2017-07-12 IMAGING — RF DG ANKLE COMPLETE 3+V*L*
1 series · 6 of 6 positions shown · non-contrast
Comparison: [DATE]

CLINICAL DATA: Right ankle fracture fixation.

EXAM:
DG C-ARM 61-120 MIN; LEFT ANKLE COMPLETE - 3+ VIEW

[Series 1: run · 6 of 6 slices shown]
[im 1/6]
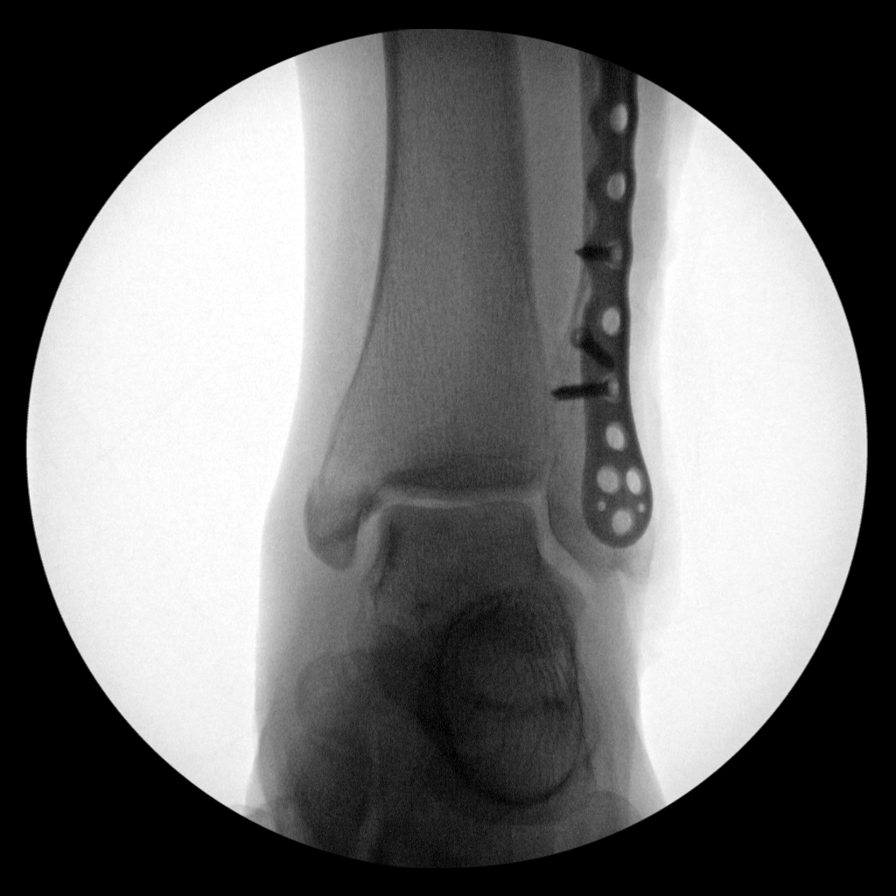
[im 2/6]
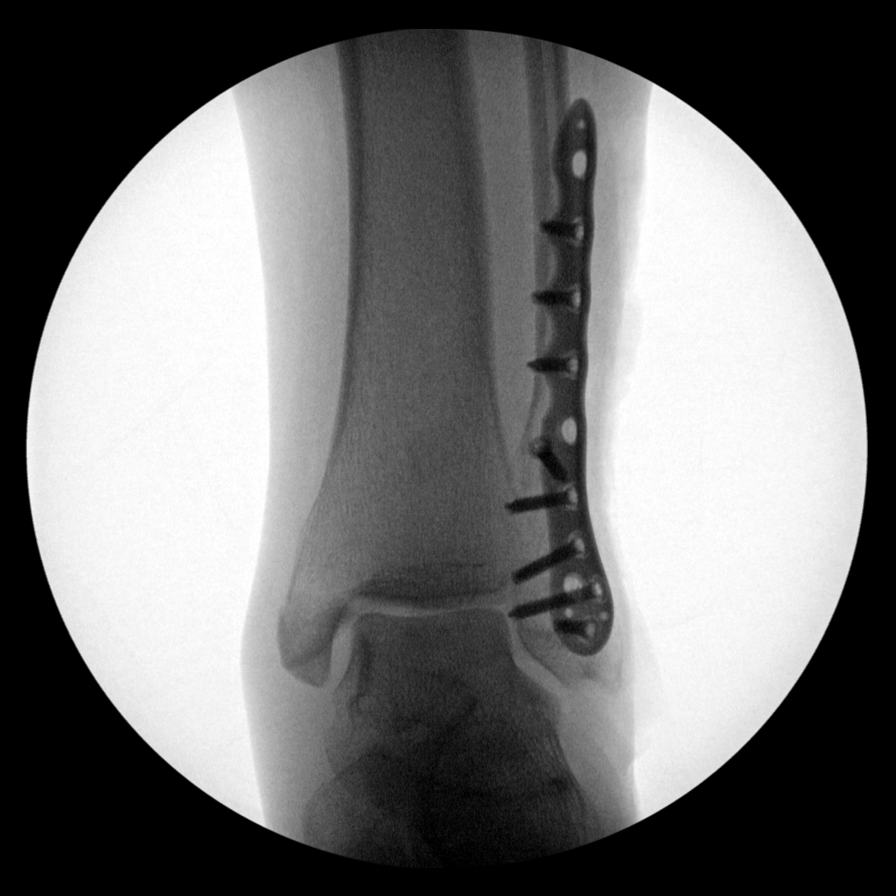
[im 3/6]
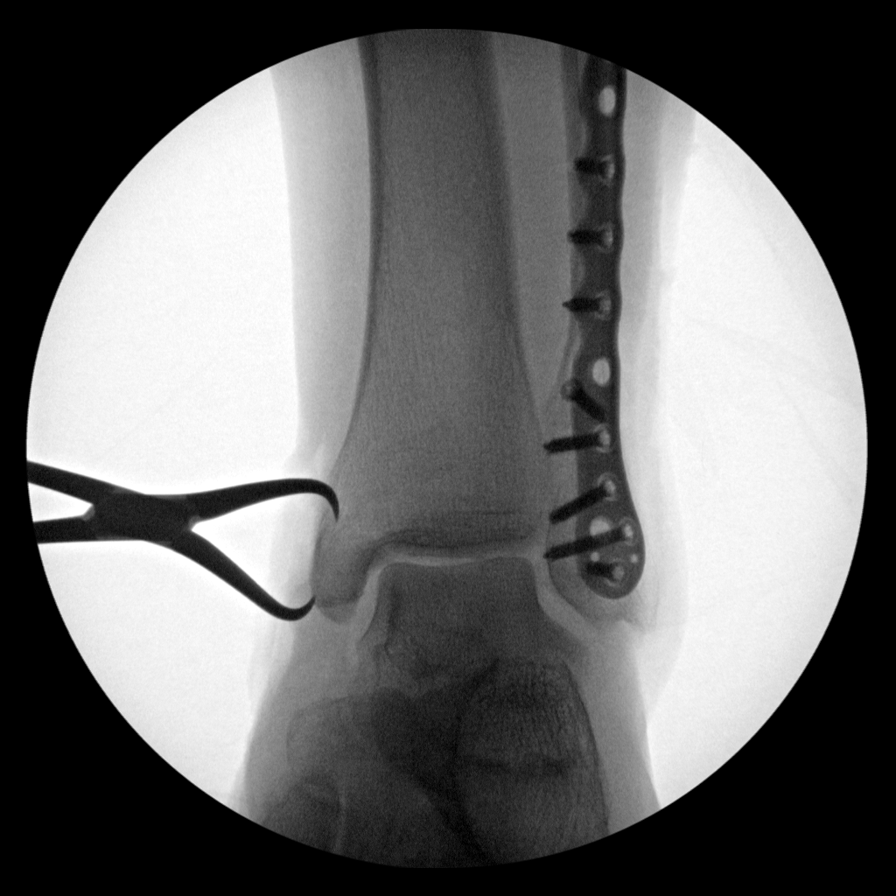
[im 4/6]
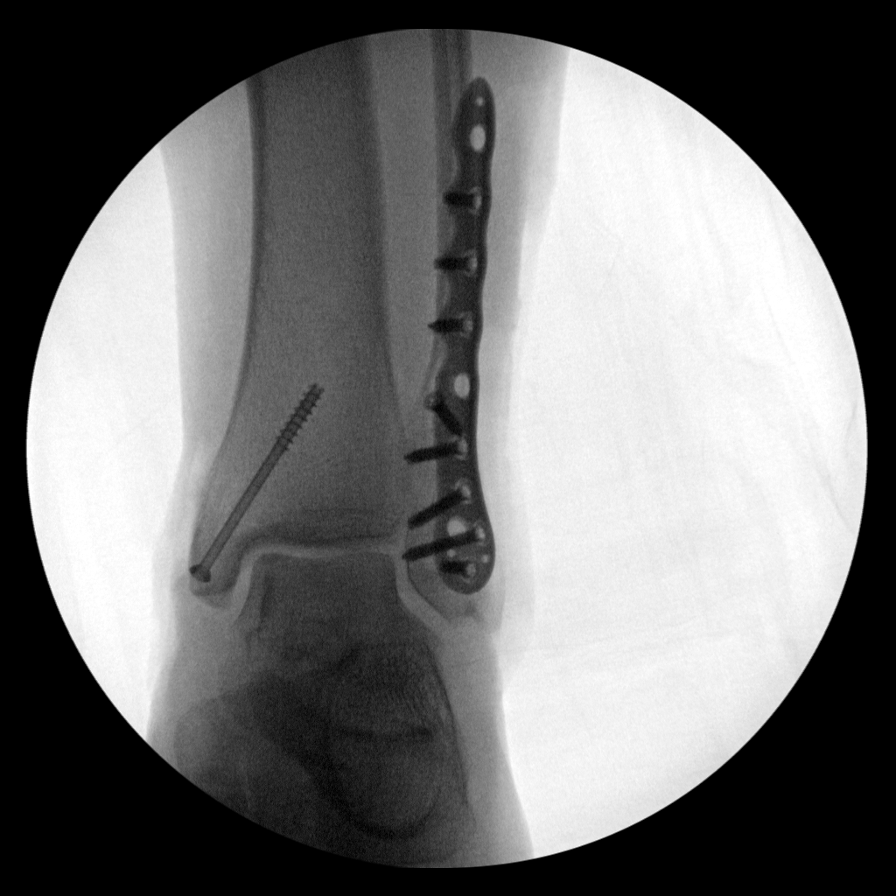
[im 5/6]
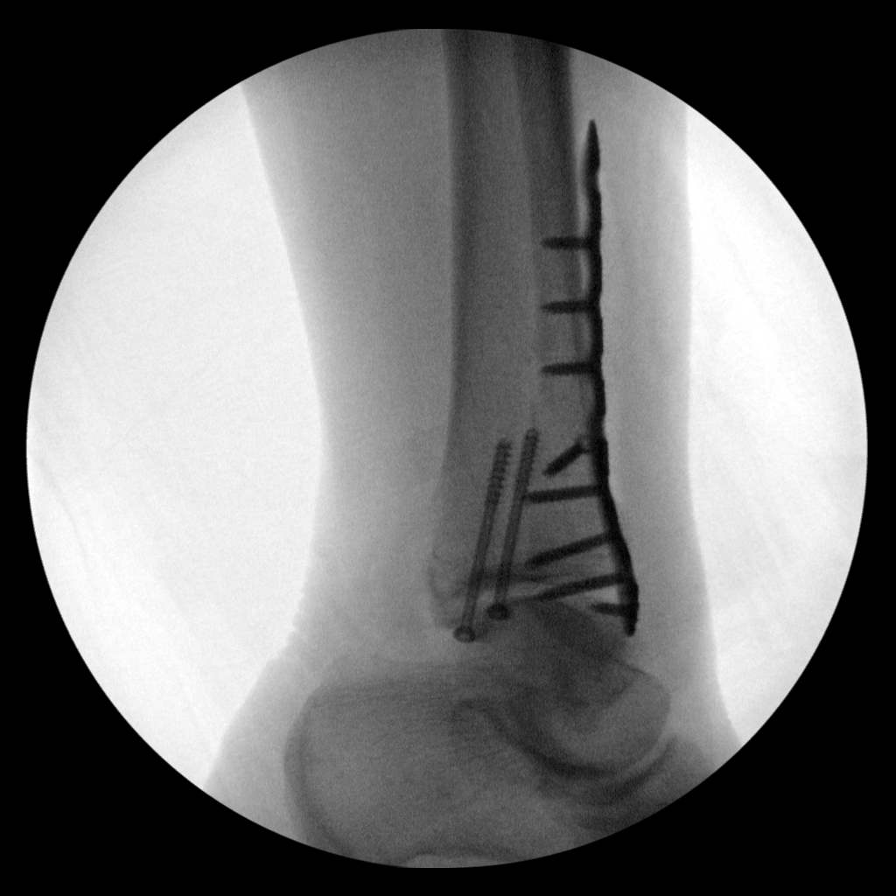
[im 6/6]
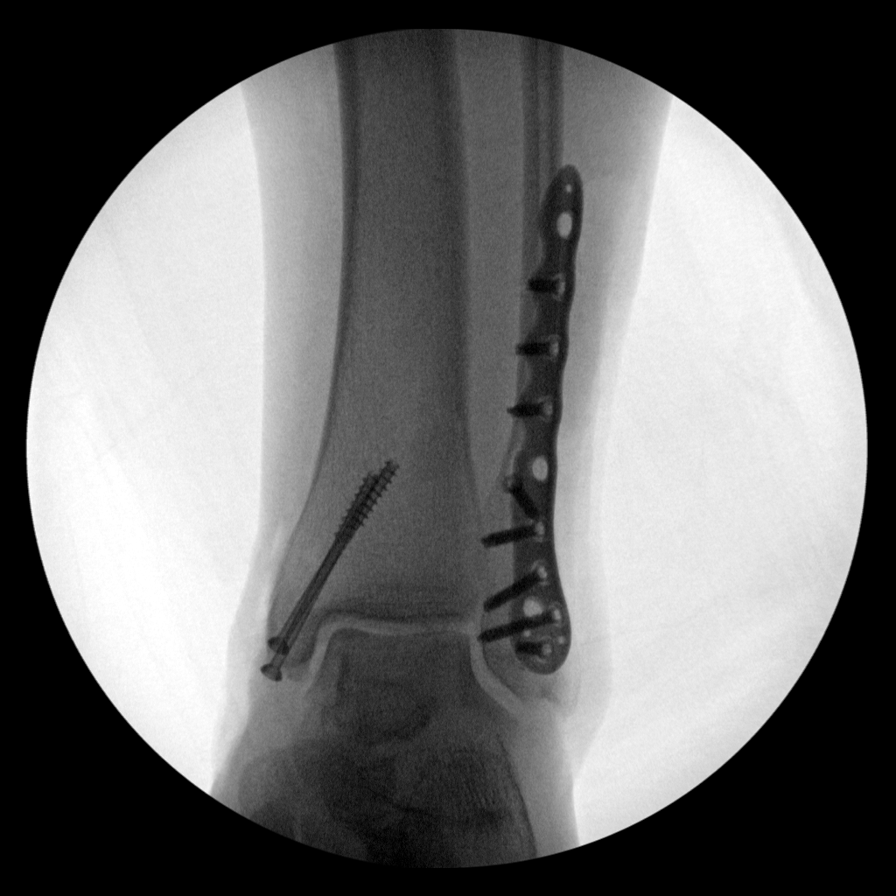

[6 of 6 positions shown; findings below may reference images not displayed]

FINDINGS: Multiple intraoperative radiographs demonstrate a long compression
plate and multiple screws transfixing the distal fibular fracture.
There are also 2 cannulated malleolar screws transfixing the medial
malleolus fracture. Anatomic position and alignment without
complicating features.
IMPRESSION: Internal fixation of ankle fractures with anatomic alignment.

## 2017-07-12 SURGERY — OPEN REDUCTION INTERNAL FIXATION (ORIF) ANKLE FRACTURE
Anesthesia: Spinal | Site: Ankle | Laterality: Left

## 2017-07-12 MED ORDER — FENTANYL CITRATE (PF) 100 MCG/2ML IJ SOLN
INTRAMUSCULAR | Status: DC | PRN
  Administered 2017-07-12 (×2): 25 ug via INTRAVENOUS
  Administered 2017-07-12: 15 ug via INTRATHECAL
  Administered 2017-07-12: 10 ug via INTRAVENOUS
  Administered 2017-07-12: 25 ug via INTRAVENOUS

## 2017-07-12 MED ORDER — BUPIVACAINE IN DEXTROSE 0.75-8.25 % IT SOLN
INTRATHECAL | Status: DC | PRN
  Administered 2017-07-12: 15 mg via INTRATHECAL

## 2017-07-12 MED ORDER — DOCUSATE SODIUM 100 MG PO CAPS
100.0000 mg | ORAL_CAPSULE | Freq: Two times a day (BID) | ORAL | Status: DC
  Administered 2017-07-12 – 2017-07-14 (×5): 100 mg via ORAL
  Filled 2017-07-12 (×5): qty 1

## 2017-07-12 MED ORDER — EPINEPHRINE PF 1 MG/ML IJ SOLN
INTRAMUSCULAR | Status: AC
  Filled 2017-07-12: qty 1

## 2017-07-12 MED ORDER — CEFAZOLIN SODIUM-DEXTROSE 2-4 GM/100ML-% IV SOLN
2.0000 g | Freq: Four times a day (QID) | INTRAVENOUS | Status: AC
  Administered 2017-07-12 – 2017-07-13 (×2): 2 g via INTRAVENOUS
  Filled 2017-07-12 (×4): qty 100

## 2017-07-12 MED ORDER — EPINEPHRINE PF 1 MG/10ML IJ SOSY
PREFILLED_SYRINGE | INTRAMUSCULAR | Status: DC | PRN
  Administered 2017-07-12: .1 ug via INTRATRACHEAL

## 2017-07-12 MED ORDER — MIDAZOLAM HCL 2 MG/2ML IJ SOLN
1.0000 mg | INTRAMUSCULAR | Status: DC
  Administered 2017-07-12: 2 mg via INTRAVENOUS
  Filled 2017-07-12: qty 2

## 2017-07-12 MED ORDER — ONDANSETRON HCL 4 MG/2ML IJ SOLN: 4.0000 mg | Freq: Three times a day (TID) | INTRAMUSCULAR | Status: AC | PRN

## 2017-07-12 MED ORDER — POVIDONE-IODINE 10 % EX SWAB: 2.0000 "application " | Freq: Once | CUTANEOUS | Status: DC

## 2017-07-12 MED ORDER — FENTANYL CITRATE (PF) 100 MCG/2ML IJ SOLN: 25.0000 ug | INTRAMUSCULAR | Status: DC | PRN

## 2017-07-12 MED ORDER — SEVOFLURANE IN SOLN
RESPIRATORY_TRACT | Status: AC
  Filled 2017-07-12: qty 250

## 2017-07-12 MED ORDER — CEFAZOLIN SODIUM-DEXTROSE 2-4 GM/100ML-% IV SOLN
2.0000 g | INTRAVENOUS | Status: AC
  Administered 2017-07-12: 2 g via INTRAVENOUS
  Filled 2017-07-12: qty 100

## 2017-07-12 MED ORDER — FENTANYL CITRATE (PF) 100 MCG/2ML IJ SOLN
25.0000 ug | Freq: Once | INTRAMUSCULAR | Status: AC
  Administered 2017-07-12: 25 ug via INTRAVENOUS
  Filled 2017-07-12: qty 2

## 2017-07-12 MED ORDER — METOCLOPRAMIDE HCL 5 MG/ML IJ SOLN: 5.0000 mg | Freq: Three times a day (TID) | INTRAMUSCULAR | Status: DC | PRN

## 2017-07-12 MED ORDER — HYDROCODONE-ACETAMINOPHEN 5-325 MG PO TABS
1.0000 | ORAL_TABLET | Freq: Four times a day (QID) | ORAL | Status: DC | PRN
  Administered 2017-07-12 – 2017-07-13 (×3): 1 via ORAL
  Administered 2017-07-13: 2 via ORAL
  Administered 2017-07-13: 1 via ORAL
  Administered 2017-07-14 (×2): 2 via ORAL
  Filled 2017-07-12 (×4): qty 2
  Filled 2017-07-12 (×3): qty 1

## 2017-07-12 MED ORDER — FENTANYL CITRATE (PF) 100 MCG/2ML IJ SOLN
INTRAMUSCULAR | Status: AC
  Filled 2017-07-12: qty 2

## 2017-07-12 MED ORDER — METOCLOPRAMIDE HCL 10 MG PO TABS: 5.0000 mg | ORAL_TABLET | Freq: Three times a day (TID) | ORAL | Status: DC | PRN

## 2017-07-12 MED ORDER — HYDROMORPHONE HCL 1 MG/ML IJ SOLN: 0.5000 mg | INTRAMUSCULAR | Status: AC | PRN

## 2017-07-12 MED ORDER — LACTATED RINGERS IV SOLN
INTRAVENOUS | Status: DC
  Administered 2017-07-12: 11:00:00 via INTRAVENOUS

## 2017-07-12 MED ORDER — CHLORHEXIDINE GLUCONATE 4 % EX LIQD: 60.0000 mL | Freq: Once | CUTANEOUS | Status: DC

## 2017-07-12 MED ORDER — PROPOFOL 10 MG/ML IV BOLUS
INTRAVENOUS | Status: DC | PRN
  Administered 2017-07-12: 20 mg via INTRAVENOUS

## 2017-07-12 MED ORDER — PHENOL 1.4 % MT LIQD: 1.0000 | OROMUCOSAL | Status: DC | PRN

## 2017-07-12 MED ORDER — BUPIVACAINE-EPINEPHRINE (PF) 0.5% -1:200000 IJ SOLN
INTRAMUSCULAR | Status: DC | PRN
  Administered 2017-07-12: 60 mL via PERINEURAL

## 2017-07-12 MED ORDER — TEMAZEPAM 15 MG PO CAPS: 15.0000 mg | ORAL_CAPSULE | Freq: Every evening | ORAL | Status: DC | PRN

## 2017-07-12 MED ORDER — ACETAMINOPHEN 650 MG RE SUPP: 650.0000 mg | Freq: Four times a day (QID) | RECTAL | Status: DC | PRN

## 2017-07-12 MED ORDER — 0.9 % SODIUM CHLORIDE (POUR BTL) OPTIME
TOPICAL | Status: DC | PRN
  Administered 2017-07-12: 1000 mL

## 2017-07-12 MED ORDER — SODIUM CHLORIDE 0.9 % IV SOLN
INTRAVENOUS | Status: DC
  Administered 2017-07-12: 14:00:00 via INTRAVENOUS

## 2017-07-12 MED ORDER — ACETAMINOPHEN 325 MG PO TABS
650.0000 mg | ORAL_TABLET | Freq: Four times a day (QID) | ORAL | Status: DC | PRN
  Administered 2017-07-13 (×2): 650 mg via ORAL
  Filled 2017-07-12 (×2): qty 2

## 2017-07-12 MED ORDER — ALUM & MAG HYDROXIDE-SIMETH 200-200-20 MG/5ML PO SUSP: 30.0000 mL | ORAL | Status: DC | PRN

## 2017-07-12 MED ORDER — BUPIVACAINE IN DEXTROSE 0.75-8.25 % IT SOLN
INTRATHECAL | Status: AC
  Filled 2017-07-12: qty 2

## 2017-07-12 MED ORDER — MENTHOL 3 MG MT LOZG: 1.0000 | LOZENGE | OROMUCOSAL | Status: DC | PRN

## 2017-07-12 MED ORDER — PROPOFOL 500 MG/50ML IV EMUL
INTRAVENOUS | Status: DC | PRN
  Administered 2017-07-12: 125 ug/kg/min via INTRAVENOUS
  Administered 2017-07-12: 25 ug/kg/min via INTRAVENOUS

## 2017-07-12 SURGICAL SUPPLY — 64 items
BANDAGE ELASTIC 4 LF NS (GAUZE/BANDAGES/DRESSINGS) ×8 IMPLANT
BANDAGE ESMARK 4X12 BL STRL LF (DISPOSABLE) ×1 IMPLANT
BIT DRILL 3.5X122MM AO FIT (BIT) ×2 IMPLANT
BIT DRILL CANN 2.7 (BIT)
BIT DRILL CANN 2.7MM (BIT)
BIT DRILL SRG 2.7XCANN AO CPLG (BIT) IMPLANT
BIT DRL SRG 2.7XCANN AO CPLNG (BIT)
BLADE SURG SZ10 CARB STEEL (BLADE) ×3 IMPLANT
BNDG CMPR 12X4 ELC STRL LF (DISPOSABLE) ×1
BNDG CMPR MED 5X4 ELC HKLP NS (GAUZE/BANDAGES/DRESSINGS) ×3
BNDG COHESIVE 4X5 TAN STRL (GAUZE/BANDAGES/DRESSINGS) ×3 IMPLANT
BNDG ESMARK 4X12 BLUE STRL LF (DISPOSABLE) ×3
CHLORAPREP W/TINT 26ML (MISCELLANEOUS) ×6 IMPLANT
CLOTH BEACON ORANGE TIMEOUT ST (SAFETY) ×3 IMPLANT
COVER LIGHT HANDLE STERIS (MISCELLANEOUS) ×6 IMPLANT
CUFF TOURNIQUET SINGLE 34IN LL (TOURNIQUET CUFF) ×3 IMPLANT
CUFF TOURNIQUET SINGLE 44IN (TOURNIQUET CUFF) IMPLANT
DECANTER SPIKE VIAL GLASS SM (MISCELLANEOUS) ×6 IMPLANT
DRAPE C-ARM FOLDED MOBILE STRL (DRAPES) ×3 IMPLANT
DRAPE PROXIMA HALF (DRAPES) ×3 IMPLANT
DRILL 2.6X122MM WL AO SHAFT (BIT) ×2 IMPLANT
GAUZE SPONGE 4X4 12PLY STRL (GAUZE/BANDAGES/DRESSINGS) ×3 IMPLANT
GAUZE XEROFORM 5X9 LF (GAUZE/BANDAGES/DRESSINGS) ×3 IMPLANT
GLOVE BIOGEL PI IND STRL 7.0 (GLOVE) ×2 IMPLANT
GLOVE BIOGEL PI INDICATOR 7.0 (GLOVE) ×6
GLOVE SKINSENSE NS SZ8.0 LF (GLOVE) ×2
GLOVE SKINSENSE STRL SZ8.0 LF (GLOVE) ×1 IMPLANT
GLOVE SS N UNI LF 8.5 STRL (GLOVE) ×3 IMPLANT
GOWN STRL REUS W/TWL LRG LVL3 (GOWN DISPOSABLE) ×6 IMPLANT
GOWN STRL REUS W/TWL XL LVL3 (GOWN DISPOSABLE) ×3 IMPLANT
INST SET MINOR BONE (KITS) ×3 IMPLANT
K-WIRE ORTHOPEDIC 1.4X150L (WIRE) ×3
KIT ROOM TURNOVER APOR (KITS) ×3 IMPLANT
KWIRE ORTHOPEDIC 1.4X150L (WIRE) IMPLANT
MANIFOLD NEPTUNE II (INSTRUMENTS) ×3 IMPLANT
NDL HYPO 21X1.5 SAFETY (NEEDLE) ×1 IMPLANT
NEEDLE HYPO 21X1.5 SAFETY (NEEDLE) ×3 IMPLANT
NS IRRIG 1000ML POUR BTL (IV SOLUTION) ×3 IMPLANT
PACK BASIC LIMB (CUSTOM PROCEDURE TRAY) ×3 IMPLANT
PAD ABD 5X9 TENDERSORB (GAUZE/BANDAGES/DRESSINGS) ×4 IMPLANT
PAD ARMBOARD 7.5X6 YLW CONV (MISCELLANEOUS) ×3 IMPLANT
PAD CAST 4YDX4 CTTN HI CHSV (CAST SUPPLIES) ×1 IMPLANT
PADDING CAST COTTON 4X4 STRL (CAST SUPPLIES) ×6
PADDING WEBRIL 4 STERILE (GAUZE/BANDAGES/DRESSINGS) ×2 IMPLANT
PLATE FIBULA 5H (Plate) ×3 IMPLANT
SCREW 46X4.0MM (Screw) ×4 IMPLANT
SCREW BONE 18 (Screw) ×2 IMPLANT
SCREW BONE 3.5X20MM (Screw) ×4 IMPLANT
SCREW BONE NON-LCKING 3.5X12MM (Screw) ×9 IMPLANT
SCREW LOCK 3.5X10MM (Screw) ×2 IMPLANT
SCREW NONLOCKING 3.5X16MM (Screw) ×2 IMPLANT
SET BASIN LINEN APH (SET/KITS/TRAYS/PACK) ×3 IMPLANT
SPLINT IMMOBILIZER J 3INX20FT (CAST SUPPLIES)
SPLINT J IMMOBILIZER 3X20FT (CAST SUPPLIES) IMPLANT
SPLINT J IMMOBILIZER 4X20FT (CAST SUPPLIES) IMPLANT
SPLINT J PLASTER J 4INX20Y (CAST SUPPLIES) ×2
SPONGE LAP 18X18 X RAY DECT (DISPOSABLE) ×3 IMPLANT
STAPLER VISISTAT 35W (STAPLE) ×3 IMPLANT
SUT ETHILON 3 0 FSL (SUTURE) IMPLANT
SUT MON AB 0 CT1 (SUTURE) ×5 IMPLANT
SUT MON AB 2-0 CT1 36 (SUTURE) IMPLANT
SYR 30ML LL (SYRINGE) ×3 IMPLANT
SYR BULB IRRIGATION 50ML (SYRINGE) ×3 IMPLANT
WATER STERILE IRR 1000ML POUR (IV SOLUTION) ×3 IMPLANT

## 2017-07-12 NOTE — Anesthesia Procedure Notes (Signed)
Procedure Name: MAC Date/Time: 07/12/2017 11:48 AM Performed by: Vista Deck, CRNA Pre-anesthesia Checklist: Patient identified, Emergency Drugs available, Suction available, Timeout performed and Patient being monitored Patient Re-evaluated:Patient Re-evaluated prior to induction Oxygen Delivery Method: Non-rebreather mask

## 2017-07-12 NOTE — Interval H&P Note (Signed)
History and Physical Interval Note:  07/12/2017 11:32 AM  Jacqueline Leon  has presented today for surgery, with the diagnosis of closed left ankle trimalleolar fracture  The various methods of treatment have been discussed with the patient and family. After consideration of risks, benefits and other options for treatment, the patient has consented to  Procedure(s): OPEN REDUCTION INTERNAL FIXATION (ORIF) ANKLE FRACTURE (Left) as a surgical intervention .  The patient's history has been reviewed, patient examined, no change in status, stable for surgery.  I have reviewed the patient's chart and labs.  Questions were answered to the patient's satisfaction.     Fuller CanadaStanley Ashden Sonnenberg

## 2017-07-12 NOTE — Transfer of Care (Signed)
Immediate Anesthesia Transfer of Care Note  Patient: Jacqueline Leon  Procedure(s) Performed: OPEN REDUCTION INTERNAL FIXATION (ORIF) ANKLE FRACTURE (Left Ankle)  Patient Location: PACU  Anesthesia Type:Spinal  Level of Consciousness: awake and patient cooperative  Airway & Oxygen Therapy: Patient Spontanous Breathing and Patient connected to face mask oxygen  Post-op Assessment: Report given to RN and Post -op Vital signs reviewed and stable  Post vital signs: Reviewed and stable  Last Vitals:  Vitals:   07/12/17 1115 07/12/17 1120  BP: 125/69 125/69  Pulse:    Resp: 18 15  Temp:    SpO2: 99% 99%    Last Pain:  Vitals:   07/12/17 1044  TempSrc: Oral  PainSc: 2       Patients Stated Pain Goal: 4 (07/12/17 1044)  Complications: No apparent anesthesia complications

## 2017-07-12 NOTE — Brief Op Note (Addendum)
07/12/2017  1:41 PM  PATIENT:  Jacqueline Leon  61 y.o. female  PRE-OPERATIVE DIAGNOSIS:  closed left ankle trimalleolar fracture  POST-OPERATIVE DIAGNOSIS:  closed left ankle trimalleolar fracture  PROCEDURE:  Procedure(s): OPEN REDUCTION INTERNAL FIXATION (ORIF) ANKLE FRACTURE (Left)   Stryker ankle solutions 6 hole cluster plate 7 screws plus 1 interfrag screw total of 8 screws  2 medial screws 4.0  Operative findings trimalleolar ankle fracture with lateral malleolus and medial malleolar fracture avulsion fracture posterior malleolus approximately 2% of the joint surface  No ligamentous injuries.  Details of procedure  The surgical site was marked in preop chart review and confirmation were performed there.  She was taken to the operating room for spinal anesthesia.  We started her Ancef 2 g.  She was in the supine position.  After sterile prep and drape timeout was completed  I started laterally with a lateral incision.  I took the incision down to bone to create full-thickness skin flaps along the subcutaneous anterior border of the fibula I carried this proximally and when I reached the soft tissues carried out blunt dissection of the peroneal fascia.  I exposed the fracture washed it out and then manually reduced the fracture and held with bone clamps.  I checked the x-ray of the fracture and the ankle mortise and it showed anatomic reduction  I then placed an interfrag screw with a 3 5 drill bit and a 2 5 drill bit.  Measuring 18 mm and inserted the screw by hand.  This stabilized the fracture and then I put a neutralization plate laterally using 7 screws the last one being a.  I then turned my attention medially.  I made a curvilinear incision posteriorly and curved distally exposed the bone and subcutaneous tissue irrigated the fracture check the joint found it to be intact without any abnormalities of the visible cartilage  I then placed a bone clamp to hold the fracture  reduced and checked the x-ray which showed anatomic reduction I then placed 2 4.0 the 46 mm screws.  Final x-rays confirmed reduction and hardware placement.  Both wounds were copiously irrigated.  I closed the medial wound with 2-0 Monocryl suture and staples in the lateral wound with 0 Monocryl and staples.  I did feel blocks of the superficial peroneal nerve the saphenous nerve and the anterior neurovascular bundle as well.  Sterile dressings and sugar tong splint were applied with the foot in neutral position  Our postoperative plan is for her to be nonweightbearing until the staples are removed and then she can go on a hard cast for 4 weeks followed by ankle boot for 6 weeks  SURGEON:  Surgeon(s) and Role:    * Vickki HearingHarrison, Rosser Collington E, MD - Primary  PHYSICIAN ASSISTANT:   ASSISTANTS: Shaft NationBetty Ashley  ANESTHESIA:   spinal  EBL:  25 mL   BLOOD ADMINISTERED:none  DRAINS: none   LOCAL MEDICATIONS USED:  MARCAINE     SPECIMEN:  No Specimen  DISPOSITION OF SPECIMEN:  N/A  COUNTS:  YES  TOURNIQUET:   Total Tourniquet Time Documented: Thigh (Left) - 61 minutes Total: Thigh (Left) - 61 minutes   DICTATION: .Reubin Milanragon Dictation  PLAN OF CARE: Admit to inpatient   PATIENT DISPOSITION:  PACU - hemodynamically stable.   Delay start of Pharmacological VTE agent (>24hrs) due to surgical blood loss or risk of bleeding: Yes, spinal anesthetic

## 2017-07-12 NOTE — Anesthesia Postprocedure Evaluation (Signed)
Anesthesia Post Note  Patient: Jacqueline Leon  Procedure(s) Performed: OPEN REDUCTION INTERNAL FIXATION (ORIF) ANKLE FRACTURE (Left Ankle)  Patient location during evaluation: PACU Anesthesia Type: Spinal Level of consciousness: awake and alert Pain management: satisfactory to patient Vital Signs Assessment: post-procedure vital signs reviewed and stable Respiratory status: spontaneous breathing Cardiovascular status: stable Postop Assessment: no apparent nausea or vomiting and spinal receding Anesthetic complications: no     Last Vitals:  Vitals:   07/12/17 1400 07/12/17 1415  BP: 100/61 100/60  Pulse: 74 68  Resp: 12 15  Temp:    SpO2: 95% 98%    Last Pain:  Vitals:   07/12/17 1044  TempSrc: Oral  PainSc: 2     LLE Motor Response: No movement due to regional block (07/12/17 1415) LLE Sensation: No sensation (absent) (07/12/17 1415) RLE Motor Response: No movement due to regional block (07/12/17 1415) RLE Sensation: No sensation (absent) (07/12/17 1415) L Sensory Level: T10-Umbilical region (07/12/17 1415) R Sensory Level: T10-Umbilical region (07/12/17 1415)  Minerva AreolaYATES,Cornelius Schuitema

## 2017-07-12 NOTE — Anesthesia Preprocedure Evaluation (Signed)
Anesthesia Evaluation  Patient identified by MRN, date of birth, ID band Patient awake    Reviewed: Allergy & Precautions, NPO status , Patient's Chart, lab work & pertinent test results  Airway Mallampati: I  TM Distance: >3 FB Neck ROM: Full    Dental  (+) Teeth Intact   Pulmonary neg pulmonary ROS, former smoker,    breath sounds clear to auscultation       Cardiovascular hypertension, Pt. on medications negative cardio ROS   Rhythm:Regular Rate:Normal     Neuro/Psych Hx Vertigo negative neurological ROS  negative psych ROS   GI/Hepatic negative GI ROS, Neg liver ROS,   Endo/Other  negative endocrine ROS  Renal/GU negative Renal ROS     Musculoskeletal   Abdominal   Peds  Hematology   Anesthesia Other Findings   Reproductive/Obstetrics                             Anesthesia Physical Anesthesia Plan  ASA: II  Anesthesia Plan: Spinal   Post-op Pain Management:    Induction: Intravenous  PONV Risk Score and Plan:   Airway Management Planned: Simple Face Mask  Additional Equipment:   Intra-op Plan:   Post-operative Plan:   Informed Consent: I have reviewed the patients History and Physical, chart, labs and discussed the procedure including the risks, benefits and alternatives for the proposed anesthesia with the patient or authorized representative who has indicated his/her understanding and acceptance.     Plan Discussed with:   Anesthesia Plan Comments:         Anesthesia Quick Evaluation

## 2017-07-12 NOTE — Anesthesia Procedure Notes (Signed)
Spinal  Patient location during procedure: OR Start time: 07/12/2017 12:00 PM End time: 07/12/2017 12:05 PM Staffing Resident/CRNA: Franco NonesYates, Merdis Snodgrass S, CRNA Preanesthetic Checklist Completed: patient identified, site marked, surgical consent, pre-op evaluation, timeout performed, IV checked, risks and benefits discussed and monitors and equipment checked Spinal Block Patient position: left lateral decubitus Prep: Betadine Patient monitoring: heart rate, cardiac monitor, continuous pulse ox and blood pressure Approach: left paramedian Location: L3-4 Injection technique: single-shot Needle Needle type: Spinocan  Needle gauge: 22 G Needle length: 9 cm Assessment Sensory level: T8 Events: CLEAR  Additional Notes Betadine prep x3 1% lidocaine skinwheal  Clear CSF pre and post injection  ATTEMPTS: 2 TRAY ID: GTI: [id number]: [id number] LOT# [id number] TRAY EXPIRATION DATE: 2019-10-01

## 2017-07-12 NOTE — Op Note (Addendum)
07/12/2017  1:41 PM  PATIENT:  Jacqueline Leon  61 y.o. female  PRE-OPERATIVE DIAGNOSIS:  closed left ankle trimalleolar fracture  POST-OPERATIVE DIAGNOSIS:  closed left ankle trimalleolar fracture  PROCEDURE:  Procedure(s): OPEN REDUCTION INTERNAL FIXATION (ORIF) ANKLE FRACTURE (Left) 27822  Stryker ankle solutions 6 hole cluster plate 7 screws plus 1 interfrag screw total of 8 screws  2 medial screws 4.0  Operative findings trimalleolar ankle fracture with lateral malleolus and medial malleolar fracture avulsion fracture posterior malleolus approximately 2% of the joint surface  No ligamentous injuries.  Details of procedure  The surgical site was marked in preop chart review and confirmation were performed there.  She was taken to the operating room for spinal anesthesia.  We started her Ancef 2 g.  She was in the supine position.  After sterile prep and drape timeout was completed  I started laterally with a lateral incision.  I took the incision down to bone to create full-thickness skin flaps along the subcutaneous anterior border of the fibula I carried this proximally and when I reached the soft tissues carried out blunt dissection of the peroneal fascia.  I exposed the fracture washed it out and then manually reduced the fracture and held with bone clamps.  I checked the x-ray of the fracture and the ankle mortise and it showed anatomic reduction  I then placed an interfrag screw with a 3 5 drill bit and a 2 5 drill bit.  Measuring 18 mm and inserted the screw by hand.  This stabilized the fracture and then I put a neutralization plate laterally using 7 screws the last one being a.  I then turned my attention medially.  I made a curvilinear incision posteriorly and curved distally exposed the bone and subcutaneous tissue irrigated the fracture check the joint found it to be intact without any abnormalities of the visible cartilage  I then placed a bone clamp to hold the  fracture reduced and checked the x-ray which showed anatomic reduction I then placed 2 4.0 the 46 mm screws.  Final x-rays confirmed reduction and hardware placement.  Both wounds were copiously irrigated.  I closed the medial wound with 2-0 Monocryl suture and staples in the lateral wound with 0 Monocryl and staples.  I did feel blocks of the superficial peroneal nerve the saphenous nerve and the anterior neurovascular bundle as well.  Sterile dressings and sugar tong splint were applied with the foot in neutral position  Our postoperative plan is for her to be nonweightbearing until the staples are removed and then she can go on a hard cast for 4 weeks followed by ankle boot for 6 weeks  SURGEON:  Surgeon(s) and Role:    * Vickki HearingHarrison, Mel Langan E, MD - Primary  PHYSICIAN ASSISTANT:   ASSISTANTS:  NationBetty Ashley  ANESTHESIA:   spinal  EBL:  25 mL   BLOOD ADMINISTERED:none  DRAINS: none   LOCAL MEDICATIONS USED:  MARCAINE     SPECIMEN:  No Specimen  DISPOSITION OF SPECIMEN:  N/A  COUNTS:  YES  TOURNIQUET:   Total Tourniquet Time Documented: Thigh (Left) - 61 minutes Total: Thigh (Left) - 61 minutes   DICTATION: .Reubin Milanragon Dictation  PLAN OF CARE: Admit to inpatient   PATIENT DISPOSITION:  PACU - hemodynamically stable.

## 2017-07-13 ENCOUNTER — Encounter (HOSPITAL_COMMUNITY): Payer: Self-pay | Admitting: Orthopedic Surgery

## 2017-07-13 DIAGNOSIS — M84375A Stress fracture, left foot, initial encounter for fracture: Secondary | ICD-10-CM | POA: Diagnosis not present

## 2017-07-13 NOTE — Evaluation (Signed)
Physical Therapy Evaluation Patient Details Name: Jacqueline Leon MRN: [id number] DOB: September 27, 1955 Today's Date: 07/13/2017   History of Present Illness  Pt is a 61 y/o female s/p fall and OPEN REDUCTION INTERNAL FIXATION (ORIF) ANKLE FRACTURE (Left) as a surgical intervention on 07/12/17, patient slipped on snow and fell    Clinical Impression  Patient with spouse present instructed in proper way to go up/down steps using SPC, bilateral siderails, and scooting on bottom.  Patient and spouse agreed to have her scoot on bottom for going up/down steps due to NWB on LLE, they received instructed on how to sit to stand from floor to chair after scooting up steps with understanding acknowleded.  Patient will benefit from continued physical therapy in hospital and recommended venue below to increase strength, balance, endurance for safe ADLs and gait.  Patient suffers from a left ankle fracture, s/p ORIF left ankle which impairs her ability to perform daily activities such as walking, prolonged standing, going to bathroom in the home.  A walker alone will not resolve the issues with performing activities of daily living. A wheelchair will allow patient to safely perform daily activities.  The patient can self propel in the home and has a caregiver who can provide assistance.       Follow Up Recommendations Home health PT;Supervision for mobility/OOB    Equipment Recommendations  Wheelchair (measurements PT);Wheelchair cushion (measurements PT)    Recommendations for Other Services       Precautions / Restrictions Precautions Precautions: Fall Restrictions Weight Bearing Restrictions: Yes LLE Weight Bearing: Non weight bearing      Mobility  Bed Mobility Overal bed mobility: Needs Assistance Bed Mobility: Supine to Sit;Sit to Supine     Supine to sit: Supervision Sit to supine: Supervision      Transfers Overall transfer level: Needs assistance Equipment used: Rolling walker (2  wheeled) Transfers: Sit to/from UGI CorporationStand;Stand Pivot Transfers Sit to Stand: Supervision Stand pivot transfers: Supervision          Ambulation/Gait Ambulation/Gait assistance: Supervision Ambulation Distance (Feet): 18 Feet Assistive device: Rolling walker (2 wheeled) Gait Pattern/deviations: Step-to pattern;Decreased stride length   Gait velocity interpretation: Below normal speed for age/gender General Gait Details: demonstrates good return for maintaining NWB LLE without loss of balance  Stairs            Wheelchair Mobility    Modified Rankin (Stroke Patients Only)       Balance                                             Pertinent Vitals/Pain Pain Score: 5  Pain Location: left ankle Pain Descriptors / Indicators: Throbbing Pain Intervention(s): Limited activity within patient's tolerance;Monitored during session;Premedicated before session    Home Living Family/patient expects to be discharged to:: Private residence Living Arrangements: Spouse/significant other Available Help at Discharge: Family;Available 24 hours/day Type of Home: House Home Access: Stairs to enter Entrance Stairs-Rails: Right;Left;Can reach both Entrance Stairs-Number of Steps: 3 Home Layout: Two level Home Equipment: Walker - 2 wheels      Prior Function Level of Independence: Independent               Hand Dominance   Dominant Hand: Right    Extremity/Trunk Assessment   Upper Extremity Assessment Upper Extremity Assessment: Defer to OT evaluation    Lower Extremity Assessment Lower Extremity  Assessment: LLE deficits/detail LLE Deficits / Details: grossly -3/5, except ankle/foot not tested secondary in splint    Cervical / Trunk Assessment Cervical / Trunk Assessment: Normal  Communication   Communication: No difficulties  Cognition Arousal/Alertness: Awake/alert Behavior During Therapy: WFL for tasks assessed/performed Overall Cognitive  Status: Within Functional Limits for tasks assessed                                        General Comments      Exercises     Assessment/Plan    PT Assessment Patient needs continued PT services  PT Problem List Decreased strength;Decreased activity tolerance;Decreased balance;Decreased mobility       PT Treatment Interventions Gait training;Stair training;Functional mobility training;Therapeutic activities;Therapeutic exercise;Patient/family education    PT Goals (Current goals can be found in the Care Plan section)  Acute Rehab PT Goals Patient Stated Goal: return home PT Goal Formulation: With patient/family Time For Goal Achievement: 07/17/17 Potential to Achieve Goals: Good    Frequency 7X/week   Barriers to discharge        Co-evaluation               AM-PAC PT "6 Clicks" Daily Activity  Outcome Measure Difficulty turning over in bed (including adjusting bedclothes, sheets and blankets)?: None Difficulty moving from lying on back to sitting on the side of the bed? : None Difficulty sitting down on and standing up from a chair with arms (e.g., wheelchair, bedside commode, etc,.)?: A Little Help needed moving to and from a bed to chair (including a wheelchair)?: A Little Help needed walking in hospital room?: A Little Help needed climbing 3-5 steps with a railing? : A Lot 6 Click Score: 19    End of Session Equipment Utilized During Treatment: Gait belt Activity Tolerance: Patient tolerated treatment well;Patient limited by fatigue Patient left: in bed;with call bell/phone within reach;with family/visitor present Nurse Communication: Mobility status PT Visit Diagnosis: Unsteadiness on feet (R26.81);Other abnormalities of gait and mobility (R26.89);Muscle weakness (generalized) (M62.81)    Time: [id number] PT Time Calculation (min) (ACUTE ONLY): 29 min   Charges:   PT Evaluation $PT Eval Moderate Complexity: 1 Mod PT  Treatments $Therapeutic Activity: 23-37 mins   PT G Codes:   PT G-Codes **NOT FOR INPATIENT CLASS** Functional Assessment Tool Used: AM-PAC 6 Clicks Basic Mobility Functional Limitation: Mobility: Walking and moving around Mobility: Walking and Moving Around Current Status ([id number](G8978): At least 20 percent but less than 40 percent impaired, limited or restricted Mobility: Walking and Moving Around Goal Status [phone](G8979): At least 20 percent but less than 40 percent impaired, limited or restricted Mobility: Walking and Moving Around Discharge Status [phone](G8980): At least 20 percent but less than 40 percent impaired, limited or restricted    2:40 PM, 07/13/17 Ocie BobJames Carlee Vonderhaar, MPT [facility]rapist [facility] [phone] office [phone] mobile phone

## 2017-07-13 NOTE — Progress Notes (Signed)
Patient ID: Jacqueline Leon, female   DOB: 06/13/1956, 61 y.o.   MRN: [id number]   BP 128/61 (BP Location: Left Arm)   Pulse (!) 108   Temp 99.6 F (37.6 C) (Oral)   Resp 18   Ht 5\' 4"  (1.626 m)   Wt 170 lb (77.1 kg)   SpO2 99%   BMI 29.18 kg/m   Postop day 1 status post left ankle fracture fixation She is doing well pain is controlled.  She is working with physical therapy for the first time.   Plan is to discharge her tomorrow  Neurovascular exam is intact the splint is intact  She is in stable condition

## 2017-07-13 NOTE — Addendum Note (Signed)
Addendum  created 07/13/17 0926 by Franco NonesYates, Trumaine Wimer S, CRNA   Intraprocedure Flowsheets edited

## 2017-07-13 NOTE — Anesthesia Postprocedure Evaluation (Signed)
Anesthesia Post Note  Patient: Erie NoeCynthia J Arneson  Procedure(s) Performed: OPEN REDUCTION INTERNAL FIXATION (ORIF) ANKLE FRACTURE (Left Ankle)  Patient location during evaluation: Nursing Unit Anesthesia Type: Spinal Level of consciousness: awake and alert, oriented and patient cooperative Pain management: pain level controlled Vital Signs Assessment: post-procedure vital signs reviewed and stable Respiratory status: spontaneous breathing and respiratory function stable Cardiovascular status: stable Postop Assessment: no apparent nausea or vomiting Anesthetic complications: no     Last Vitals:  Vitals:   07/13/17 0400 07/13/17 0811  BP: 128/61 135/74  Pulse: (!) 108 (!) 106  Resp: 18 18  Temp: 37.6 C 37.2 C  SpO2: 99% 99%    Last Pain:  Vitals:   07/13/17 0811  TempSrc: Oral  PainSc:                  ADAMS, AMY A

## 2017-07-13 NOTE — Plan of Care (Signed)
  Acute Rehab PT Goals(only PT should resolve) Pt Will Go Supine/Side To Sit 07/13/2017 1451 - Progressing by Ocie BobWatkins, Ellenor Wisniewski, PT Flowsheets Taken 07/13/2017 1451  Pt will go Supine/Side to Sit with modified independence Pt Will Go Sit To Supine/Side 07/13/2017 1451 - Progressing by Ocie BobWatkins, Bartholomew Ramesh, PT Flowsheets Taken 07/13/2017 1451  Pt will go Sit to Supine/Side with modified independence Patient Will Transfer Sit To/From Stand 07/13/2017 1451 - Progressing by Ocie BobWatkins, Kamora Vossler, PT Flowsheets Taken 07/13/2017 1451  Patient will transfer sit to/from stand with modified independence Pt Will Transfer Bed To Chair/Chair To Bed 07/13/2017 1451 - Progressing by Ocie BobWatkins, Deane Melick, PT Flowsheets Taken 07/13/2017 1451  Pt will Transfer Bed to Chair/Chair to Bed with modified independence Pt Will Ambulate 07/13/2017 1451 - Progressing by Ocie BobWatkins, Cristofher Livecchi, PT Flowsheets Taken 07/13/2017 1451  Pt will Ambulate 25 feet;with modified independence;with rolling walker Note While maintaining NWB LLE  2:52 PM, 07/13/17 Ocie BobJames Markas Aldredge, MPT Physical Therapist [facility] [phone] office [phone] mobile phone

## 2017-07-13 NOTE — Plan of Care (Signed)
Pt alert and oriented x4. Able to make needs known. No distress at this time. Heels elevated off bed. Ace wrap to left lower extremity. Call light within reach. Bed in lowest position. Will continue to monitor.

## 2017-07-13 NOTE — Care Management Note (Signed)
Case Management Note  Patient Details  Name: Jacqueline Leon MRN: [id number] Date of Birth: 01/20/1956  Subjective/Objective:   Adm with trimalleolar fracture, had surgery yesterday. From home with husband, ind with ADL's . Will be non weightbearing until staples removed. Worked with PT this morning. Recommended for HH PT and WC.                 Action/Plan: Offered choice of Home health agencies. [facility] and will obtain [facility] from Epic. WC will be delivered to room prior to DC. Anticipate DC home tomorrow, 07-14-2017.   Expected Discharge Date:     07/14/2017             Expected Discharge Plan:  Home w Home Health Services  In-House Referral:     Discharge planning Services  CM Consult  Post Acute Care Choice:  Durable Medical Equipment, Home Health Choice offered to:  Patient  DME Arranged:  Wheelchair manual DME Agency:  [facility]  [facility]  [facility]  [facility]  Status of Service:  In process, will continue to follow  If discussed at Long Length of Stay Meetings, dates discussed:    Additional Comments:  Christyna Letendre, Chrystine OilerSharley Diane, RN 07/13/2017, 10:20 AM

## 2017-07-13 NOTE — Addendum Note (Signed)
Addendum  created 07/13/17 1135 by Earleen NewportAdams, Amy A, CRNA   Sign clinical note

## 2017-07-13 NOTE — Clinical Social Work Note (Signed)
Patient is going home with HH. CM aware and making appropriate arrangements.   LCSW signing off.      Cavon Nicolls, Juleen ChinaHeather D, LCSW

## 2017-07-13 NOTE — Evaluation (Signed)
Occupational Therapy Evaluation Patient Details Name: Jacqueline Leon MRN: [id number] DOB: 10-02-1955 Today's Date: 07/13/2017    History of Present Illness Pt is a 61 y/o female s/p fall and OPEN REDUCTION INTERNAL FIXATION (ORIF) ANKLE FRACTURE (Left) as a surgical intervention on 07/12/17   Clinical Impression   Pt received supine in bed, agreeable to OT evaluation. Pt performing ADLs at supervision/min guard level, able to maintain NWB status for LLE without difficulty during ADLs and functional mobility. Husband will be present 24/7 to assist pt with ADL needs, pt is able to stay on main level of house on discharge home. No further OT services required at this time.     Follow Up Recommendations  No OT follow up;Supervision/Assistance - 24 hour    Equipment Recommendations  Tub/shower seat       Precautions / Restrictions Precautions Precautions: Fall;Other (comment) Precaution Comments: NWB on LLE Restrictions Weight Bearing Restrictions: Yes LLE Weight Bearing: Non weight bearing      Mobility Bed Mobility Overal bed mobility: Needs Assistance Bed Mobility: Supine to Sit     Supine to sit: Min guard        Transfers Overall transfer level: Needs assistance Equipment used: Rolling walker (2 wheeled) Transfers: Sit to/from Stand Sit to Stand: Min guard                  ADL either performed or assessed with clinical judgement   ADL Overall ADL's : Needs assistance/impaired Eating/Feeding: Modified independent;Sitting   Grooming: Wash/dry hands;Min guard;Standing               Lower Body Dressing: Modified independent;Bed level   Toilet Transfer: Min guard;Cueing for safety;Regular Toilet;RW;Grab bars   Toileting- ArchitectClothing Manipulation and Hygiene: Min guard;Sit to/from stand       Functional mobility during ADLs: Min guard;Rolling walker       Vision Baseline Vision/History: No visual deficits Patient Visual Report: No change from  baseline Vision Assessment?: No apparent visual deficits            Pertinent Vitals/Pain Pain Assessment: 0-10 Pain Score: 5  Pain Location: left ankle Pain Descriptors / Indicators: Throbbing(pulsing) Pain Intervention(s): Limited activity within patient's tolerance;Monitored during session;Premedicated before session;Repositioned     Hand Dominance Right   Extremity/Trunk Assessment Upper Extremity Assessment Upper Extremity Assessment: Overall WFL for tasks assessed   Lower Extremity Assessment Lower Extremity Assessment: Defer to PT evaluation   Cervical / Trunk Assessment Cervical / Trunk Assessment: Normal   Communication Communication Communication: No difficulties   Cognition Arousal/Alertness: Awake/alert Behavior During Therapy: WFL for tasks assessed/performed Overall Cognitive Status: Within Functional Limits for tasks assessed                                                Home Living Family/patient expects to be discharged to:: Private residence Living Arrangements: Spouse/significant other Available Help at Discharge: Family;Available 24 hours/day Type of Home: House Home Access: Stairs to enter Entergy CorporationEntrance Stairs-Number of Steps: 3 Entrance Stairs-Rails: Right;Left;Can reach both Home Layout: Two level Alternate Level Stairs-Number of Steps: 13 Alternate Level Stairs-Rails: Right;Left;Can reach both Bathroom Shower/Tub: Chief Strategy OfficerTub/shower unit   Bathroom Toilet: Standard     Home Equipment: Environmental consultantWalker - 2 wheels          Prior Functioning/Environment Level of Independence: Independent  OT Problem List: Pain;Impaired balance (sitting and/or standing)       End of Session Equipment Utilized During Treatment: Gait belt;Rolling walker  Activity Tolerance: Patient tolerated treatment well Patient left: in chair;with call bell/phone within reach;with family/visitor present  OT Visit Diagnosis: Pain;Unsteadiness on  feet (R26.81) Pain - Right/Left: Left Pain - part of body: Ankle and joints of foot                Time: [id number] OT Time Calculation (min): 30 min Charges:  OT General Charges $OT Visit: 1 Visit OT Evaluation $OT Eval Low Complexity: 1 Low    Ezra SitesLeslie Nance Mccombs, OTR/L  [phone] 07/13/2017, 9:04 AM

## 2017-07-14 MED ORDER — HYDROCODONE-ACETAMINOPHEN 5-325 MG PO TABS: 1.0000 | ORAL_TABLET | ORAL | 0 refills | Status: DC | PRN

## 2017-07-14 NOTE — Discharge Summary (Signed)
Physician Discharge Summary  Patient ID: Jacqueline Leon MRN: [id number] DOB/AGE: [birth year] 61 y.o.  Admit date: 07/11/2017 Discharge date: 07/14/2017  Admission Diagnoses: trimalleolar ankle fracture left ankle   Discharge Diagnoses: same   Discharged Condition: good  Procedure: orif left ankle [employer] ankle solutions implants   Hospital Course:  HD 1 ADMIT HD 2 SURGERY  HD 3 PT HD 4 DISCHARGE    Discharge Exam: Blood pressure (!) 132/55, pulse (!) 112, temperature 98.9 F (37.2 C), temperature source Oral, resp. rate 18, height 5\' 4"  (1.626 m), weight 170 lb (77.1 kg), SpO2 99 %. NEUROVASCULAR INTACT   Disposition: Final discharge disposition not confirmed  Discharge Instructions    Call MD / Call 911   Complete by:  As directed    If you experience chest pain or shortness of breath, CALL 911 and be transported to the hospital emergency room.  If you develope a fever above 101 F, pus (white drainage) or increased drainage or redness at the wound, or calf pain, call your surgeon's office.   Constipation Prevention   Complete by:  As directed    Drink plenty of fluids.  Prune juice may be helpful.  You may use a stool softener, such as Colace (over the counter) 100 mg twice a day.  Use MiraLax (over the counter) for constipation as needed.   Diet - low sodium heart healthy   Complete by:  As directed    Increase activity slowly as tolerated   Complete by:  As directed      Allergies as of 07/14/2017   No Known Allergies     Medication List    TAKE these medications   cholecalciferol 1000 units tablet Commonly known as:  VITAMIN D Take 1,000 Units by mouth daily.   hydrochlorothiazide 12.5 MG capsule Commonly known as:  MICROZIDE TAKE ONE CAPSULE BY MOUTH DAILY.   HYDROcodone-acetaminophen 5-325 MG tablet Commonly known as:  NORCO/VICODIN Take 1 tablet by mouth every 4 (four) hours as needed for moderate pain.   meclizine 25 MG tablet Commonly known as:   ANTIVERT Take 25 mg by mouth daily as needed for dizziness.   naproxen sodium 220 MG tablet Commonly known as:  ALEVE Take 440 mg by mouth daily as needed (pain).   simvastatin 20 MG tablet Commonly known as:  ZOCOR TAKE ONE TABLET BY MOUTH ONCE A DAY AT BEDTIME FOR CHOLESTEROL.            Durable Medical Equipment  (From admission, onward)        Start     Ordered   07/13/17 1024  For home use only DME standard manual wheelchair with seat cushion  Once    Comments:  Patient suffers from ankle fracturewhich impairs their ability to perform daily activities like walking in the home.  A rolling waker will not resolve  issue with performing activities of daily living. A wheelchair will allow patient to safely perform daily activities. Patient can safely propel the wheelchair in the home or has a caregiver who can provide assistance.  Accessories: elevating leg rests (ELRs), wheel locks, extensions and anti-tippers.   07/13/17 1024     Follow-up Information    Vickki HearingHarrison, Rae Sutcliffe E, MD Follow up on 07/23/2017.   Specialties:  Orthopedic Surgery, Radiology Why:  xrays and suture staple removal  Contact information: [address] [place] [address] [phone]           Signed: Fuller CanadaStanley Kairee Isa 07/14/2017, 8:33 AM

## 2017-07-14 NOTE — Progress Notes (Signed)
Patient is to be discharged home and in stable condition. Patient's IV removed, WNL. Patient given discharge instructions and verbalized understanding. Wheelchair at bedside. Patient will be escorted out by staff via wheelchair when transportation is available.  Quita SkyeMorgan P Dishmon, RN

## 2017-07-14 NOTE — Discharge Instructions (Signed)
No weight bearing on operative leg

## 2017-07-14 NOTE — Progress Notes (Signed)
Physical Therapy Treatment Patient Details Name: Jacqueline NoeCynthia J Caillouet MRN: [id number] DOB: 1956/07/04 Today's Date: 07/14/2017    History of Present Illness Pt is a 61 y/o female s/p fall and OPEN REDUCTION INTERNAL FIXATION (ORIF) ANKLE FRACTURE (Left) as a surgical intervention on 07/12/17, patient slipped on snow and fell    PT Comments    Patient demonstrates increased endurance for gait training, good return for completing HEP with written instructions provided, good return for use of wheelchair x 50 feet and tolerated staying up in wheelchair after therapy.  Patient will benefit from continued physical therapy in hospital and recommended venue below to increase strength, balance, endurance for safe ADLs and gait.    Follow Up Recommendations  Home health PT;Supervision for mobility/OOB     Equipment Recommendations  Wheelchair (measurements PT);Wheelchair cushion (measurements PT)    Recommendations for Other Services       Precautions / Restrictions Precautions Precautions: Fall Precaution Comments: NWB on LLE Restrictions Weight Bearing Restrictions: Yes LLE Weight Bearing: Non weight bearing    Mobility  Bed Mobility Overal bed mobility: Needs Assistance Bed Mobility: Supine to Sit;Sit to Supine     Supine to sit: Supervision Sit to supine: Supervision      Transfers Overall transfer level: Modified independent Equipment used: Rolling walker (2 wheeled) Transfers: Sit to/from UGI CorporationStand;Stand Pivot Transfers Sit to Stand: Modified independent (Device/Increase time) Stand pivot transfers: Modified independent (Device/Increase time)       General transfer comment: demonstrates good return for using RW and maintaining NWB LLE  Ambulation/Gait Ambulation/Gait assistance: Supervision Ambulation Distance (Feet): 45 Feet Assistive device: Rolling walker (2 wheeled) Gait Pattern/deviations: Decreased stride length;Step-to pattern   Gait velocity interpretation: Below  normal speed for age/gender General Gait Details: demonstrates good return for maintaining NWB LLE without loss of balance   Stairs            Wheelchair Mobility    Modified Rankin (Stroke Patients Only)       Balance Overall balance assessment: Needs assistance Sitting-balance support: No upper extremity supported;Feet supported Sitting balance-Leahy Scale: Good     Standing balance support: Bilateral upper extremity supported;During functional activity Standing balance-Leahy Scale: Good                              Cognition Arousal/Alertness: Awake/alert Behavior During Therapy: WFL for tasks assessed/performed Overall Cognitive Status: Within Functional Limits for tasks assessed                                        Exercises General Exercises - Lower Extremity Ankle Circles/Pumps: Supine;AROM;Left;Strengthening;10 reps Quad Sets: Supine;AROM;Strengthening;Left;10 reps Short Arc Quad: Supine;AROM;Strengthening;Left;5 reps Heel Slides: Supine;AROM;Strengthening;Left;5 reps Hip ABduction/ADduction: Supine;AROM;Strengthening;Left;5 reps(also completed adductor squeezes with pillow with both legs x 10 reps)    General Comments        Pertinent Vitals/Pain Pain Score: 5  Pain Location: left ankle Pain Descriptors / Indicators: Throbbing Pain Intervention(s): Limited activity within patient's tolerance;Monitored during session    Home Living                      Prior Function            PT Goals (current goals can now be found in the care plan section) Acute Rehab PT Goals Patient Stated Goal: return home PT Goal Formulation:  With patient/family Time For Goal Achievement: 07/17/17 Potential to Achieve Goals: Good Progress towards PT goals: Progressing toward goals    Frequency    7X/week      PT Plan Current plan remains appropriate    Co-evaluation              AM-PAC PT "6 Clicks" Daily  Activity  Outcome Measure  Difficulty turning over in bed (including adjusting bedclothes, sheets and blankets)?: None Difficulty moving from lying on back to sitting on the side of the bed? : None Difficulty sitting down on and standing up from a chair with arms (e.g., wheelchair, bedside commode, etc,.)?: None Help needed moving to and from a bed to chair (including a wheelchair)?: A Little Help needed walking in hospital room?: A Little Help needed climbing 3-5 steps with a railing? : A Lot 6 Click Score: 20    End of Session   Activity Tolerance: Patient tolerated treatment well;Patient limited by fatigue Patient left: in chair;with call bell/phone within reach;with family/visitor present Nurse Communication: Mobility status PT Visit Diagnosis: Unsteadiness on feet (R26.81);Other abnormalities of gait and mobility (R26.89);Muscle weakness (generalized) (M62.81)     Time: [id number] PT Time Calculation (min) (ACUTE ONLY): 32 min  Charges:  $Gait Training: 8-22 mins $Therapeutic Exercise: 8-22 mins                    G Codes:  Functional Assessment Tool Used: AM-PAC 6 Clicks Basic Mobility Functional Limitation: Mobility: Walking and moving around Mobility: Walking and Moving Around Current Status ([id number](G8978): At least 20 percent but less than 40 percent impaired, limited or restricted Mobility: Walking and Moving Around Goal Status [phone]([phone]): At least 20 percent but less than 40 percent impaired, limited or restricted Mobility: Walking and Moving Around Discharge Status [phone](G8980): At least 20 percent but less than 40 percent impaired, limited or restricted    10:48 AM, 07/14/17 Ocie BobJames Amaris Delafuente, MPT Physical Therapist with [facility] 336 [phone] office [phone] mobile phone

## 2017-07-15 DIAGNOSIS — S82852D Displaced trimalleolar fracture of left lower leg, subsequent encounter for closed fracture with routine healing: Secondary | ICD-10-CM | POA: Diagnosis not present

## 2017-07-15 DIAGNOSIS — I10 Essential (primary) hypertension: Secondary | ICD-10-CM | POA: Diagnosis not present

## 2017-07-20 ENCOUNTER — Encounter: Payer: Self-pay | Admitting: Orthopedic Surgery

## 2017-07-20 ENCOUNTER — Telehealth: Payer: Self-pay | Admitting: Orthopedic Surgery

## 2017-07-20 NOTE — Telephone Encounter (Signed)
Patient called, confirmed appointment for 07/23/17. Requests note, per employer, for out of work; date of injury 07/11/17 and date of surgery 07/12/17.  Advised note can be done and will be forwarded to Dr Romeo AppleHarrison to approve.  Discussed processes of [place] and short-term disability forms as well as authorization forms in order to forward the information to employer and to disability insurer.  She will try to come to office prior to appointment.

## 2017-07-22 DIAGNOSIS — Z9889 Other specified postprocedural states: Secondary | ICD-10-CM | POA: Insufficient documentation

## 2017-07-22 DIAGNOSIS — Z8781 Personal history of (healed) traumatic fracture: Secondary | ICD-10-CM

## 2017-07-23 ENCOUNTER — Encounter: Payer: Self-pay | Admitting: Orthopedic Surgery

## 2017-07-23 ENCOUNTER — Ambulatory Visit (INDEPENDENT_AMBULATORY_CARE_PROVIDER_SITE_OTHER): Payer: 59

## 2017-07-23 ENCOUNTER — Ambulatory Visit (INDEPENDENT_AMBULATORY_CARE_PROVIDER_SITE_OTHER): Payer: 59 | Admitting: Orthopedic Surgery

## 2017-07-23 VITALS — BP 123/77 | HR 93 | Ht 64.0 in

## 2017-07-23 DIAGNOSIS — Z967 Presence of other bone and tendon implants: Secondary | ICD-10-CM

## 2017-07-23 DIAGNOSIS — S82852D Displaced trimalleolar fracture of left lower leg, subsequent encounter for closed fracture with routine healing: Secondary | ICD-10-CM

## 2017-07-23 DIAGNOSIS — Z8781 Personal history of (healed) traumatic fracture: Secondary | ICD-10-CM

## 2017-07-23 DIAGNOSIS — Z9889 Other specified postprocedural states: Secondary | ICD-10-CM

## 2017-07-23 NOTE — Progress Notes (Signed)
Postop visit #1 status post open treatment internal fixation left ankle  Chief Complaint  Patient presents with  . Routine Post Op    ORIF left ankle 07/12/17    Postop day 11  The patient has a blister on the dorsum of the foot pain is still well controlled.  Her wounds look fine  Her x-ray looks good  Went to place her in a short leg cast with as much dorsiflexion as we can get and then have her come back in 2 weeks to change the cast and check the blister and both wounds  She has not required any pain medication  She is nonweightbearing

## 2017-07-23 NOTE — Patient Instructions (Signed)
Weightbearing, continue elevation to control swelling

## 2017-07-29 DIAGNOSIS — S82852D Displaced trimalleolar fracture of left lower leg, subsequent encounter for closed fracture with routine healing: Secondary | ICD-10-CM | POA: Diagnosis not present

## 2017-07-29 DIAGNOSIS — I10 Essential (primary) hypertension: Secondary | ICD-10-CM | POA: Diagnosis not present

## 2017-08-05 ENCOUNTER — Encounter: Payer: Self-pay | Admitting: Orthopedic Surgery

## 2017-08-05 ENCOUNTER — Ambulatory Visit (INDEPENDENT_AMBULATORY_CARE_PROVIDER_SITE_OTHER): Payer: 59 | Admitting: Orthopedic Surgery

## 2017-08-05 VITALS — BP 146/96 | HR 85 | Ht 64.0 in

## 2017-08-05 DIAGNOSIS — Z967 Presence of other bone and tendon implants: Secondary | ICD-10-CM

## 2017-08-05 DIAGNOSIS — Z9889 Other specified postprocedural states: Secondary | ICD-10-CM

## 2017-08-05 DIAGNOSIS — Z8781 Personal history of (healed) traumatic fracture: Secondary | ICD-10-CM

## 2017-08-05 MED ORDER — HYDROCODONE-ACETAMINOPHEN 5-325 MG PO TABS: 1.0000 | ORAL_TABLET | Freq: Four times a day (QID) | ORAL | 0 refills | Status: DC | PRN

## 2017-08-05 NOTE — Progress Notes (Signed)
Post op   ORIF LEFT ANKLE   Pod 24  Chief Complaint  Patient presents with  . Ankle Injury    07/12/17     WOUNDS DRESSED WITH ZEROFORM AND GAUZE  All wounds clean dry and intact.  Perfusion pulse color normal.  Dorsum of foot dull sensation but the toes have normal sensation   Cam Walker nonweightbearing   3 weeks x-ray

## 2017-08-06 ENCOUNTER — Other Ambulatory Visit: Payer: 59 | Admitting: Adult Health

## 2017-08-06 ENCOUNTER — Ambulatory Visit (HOSPITAL_COMMUNITY): Payer: 59

## 2017-08-06 ENCOUNTER — Ambulatory Visit: Payer: 59 | Admitting: Orthopedic Surgery

## 2017-08-13 DIAGNOSIS — M84375A Stress fracture, left foot, initial encounter for fracture: Secondary | ICD-10-CM | POA: Diagnosis not present

## 2017-08-25 ENCOUNTER — Ambulatory Visit (INDEPENDENT_AMBULATORY_CARE_PROVIDER_SITE_OTHER): Payer: 59

## 2017-08-25 ENCOUNTER — Encounter: Payer: Self-pay | Admitting: Orthopedic Surgery

## 2017-08-25 ENCOUNTER — Ambulatory Visit (INDEPENDENT_AMBULATORY_CARE_PROVIDER_SITE_OTHER): Payer: Self-pay | Admitting: Orthopedic Surgery

## 2017-08-25 VITALS — BP 132/81 | HR 90 | Ht 64.0 in

## 2017-08-25 DIAGNOSIS — Z8781 Personal history of (healed) traumatic fracture: Secondary | ICD-10-CM

## 2017-08-25 DIAGNOSIS — S82852D Displaced trimalleolar fracture of left lower leg, subsequent encounter for closed fracture with routine healing: Secondary | ICD-10-CM | POA: Diagnosis not present

## 2017-08-25 DIAGNOSIS — Z9889 Other specified postprocedural states: Secondary | ICD-10-CM

## 2017-08-25 DIAGNOSIS — Z967 Presence of other bone and tendon implants: Secondary | ICD-10-CM

## 2017-08-25 MED ORDER — HYDROCODONE-ACETAMINOPHEN 5-325 MG PO TABS: 1.0000 | ORAL_TABLET | Freq: Three times a day (TID) | ORAL | 0 refills | Status: DC | PRN

## 2017-08-25 NOTE — Progress Notes (Signed)
POST OP FOLLOW UP   Chief Complaint  Patient presents with  . Post-op Follow-up    left ankle 07/12/17    DAY # 44  X-rays show the fracture is healing well hardware is in good position  Meds ordered this encounter  Medications  . HYDROcodone-acetaminophen (NORCO/VICODIN) 5-325 MG tablet    Sig: Take 1 tablet by mouth every 8 (eight) hours as needed for moderate pain.    Dispense:  30 tablet    Refill:  0    Wounds are clean dry and intact skin is ecchymotic she can move her toes good she can move her ankle good her foot is plantigrade  Recommend weightbearing as tolerated x-ray in 6 weeks

## 2017-09-01 ENCOUNTER — Other Ambulatory Visit: Payer: Self-pay | Admitting: Adult Health

## 2017-09-13 ENCOUNTER — Telehealth: Payer: Self-pay | Admitting: Orthopedic Surgery

## 2017-09-13 DIAGNOSIS — M84375A Stress fracture, left foot, initial encounter for fracture: Secondary | ICD-10-CM | POA: Diagnosis not present

## 2017-09-13 NOTE — Telephone Encounter (Signed)
Re-Faxing notes per request (same as on 09/03/17 and 09/06/17) to [employer] [phone]; authorization on file. Patient aware

## 2017-09-27 ENCOUNTER — Telehealth: Payer: Self-pay | Admitting: Orthopedic Surgery

## 2017-09-27 NOTE — Telephone Encounter (Signed)
Form/notes faxed per request to [employer] - fax #[phone] (on 09/20/17 and 09/27/17). Signed authorization on file.  Patient aware.

## 2017-10-06 ENCOUNTER — Ambulatory Visit (INDEPENDENT_AMBULATORY_CARE_PROVIDER_SITE_OTHER): Payer: 59

## 2017-10-06 ENCOUNTER — Encounter: Payer: Self-pay | Admitting: Orthopedic Surgery

## 2017-10-06 ENCOUNTER — Telehealth: Payer: Self-pay | Admitting: Orthopedic Surgery

## 2017-10-06 ENCOUNTER — Ambulatory Visit (INDEPENDENT_AMBULATORY_CARE_PROVIDER_SITE_OTHER): Payer: 59 | Admitting: Orthopedic Surgery

## 2017-10-06 VITALS — BP 107/78 | HR 110 | Ht 64.0 in | Wt 175.0 lb

## 2017-10-06 DIAGNOSIS — Z8781 Personal history of (healed) traumatic fracture: Secondary | ICD-10-CM

## 2017-10-06 DIAGNOSIS — S82852D Displaced trimalleolar fracture of left lower leg, subsequent encounter for closed fracture with routine healing: Secondary | ICD-10-CM

## 2017-10-06 DIAGNOSIS — Z9889 Other specified postprocedural states: Secondary | ICD-10-CM

## 2017-10-06 DIAGNOSIS — Z967 Presence of other bone and tendon implants: Secondary | ICD-10-CM

## 2017-10-06 MED ORDER — HYDROCODONE-ACETAMINOPHEN 5-325 MG PO TABS: 1.0000 | ORAL_TABLET | Freq: Three times a day (TID) | ORAL | 0 refills | Status: DC | PRN

## 2017-10-06 NOTE — Telephone Encounter (Signed)
Thanks that is fine, she should continue to elevate when she can.

## 2017-10-06 NOTE — Progress Notes (Signed)
Post op and Fracture care follow-up  Chief Complaint  Patient presents with  . Post-op Follow-up    left ankle fracture ORIF 07/12/17    Encounter Diagnoses  Name Primary?  . S/P ORIF (open reduction internal fixation) fracture 07/12/17 Yes  . Closed trimalleolar fracture of ankle with routine healing, left    POD # 86    Current Outpatient Medications:  .  cholecalciferol (VITAMIN D) 1000 UNITS tablet, Take 1,000 Units by mouth daily., Disp: , Rfl:  .  hydrochlorothiazide (MICROZIDE) 12.5 MG capsule, TAKE ONE CAPSULE BY MOUTH DAILY., Disp: 30 capsule, Rfl: 6 .  HYDROcodone-acetaminophen (NORCO/VICODIN) 5-325 MG tablet, Take 1 tablet by mouth every 8 (eight) hours as needed for moderate pain., Disp: 30 tablet, Rfl: 0 .  meclizine (ANTIVERT) 25 MG tablet, Take 25 mg by mouth daily as needed for dizziness. , Disp: , Rfl:  .  naproxen sodium (ALEVE) 220 MG tablet, Take 440 mg by mouth daily as needed (pain)., Disp: , Rfl:  .  simvastatin (ZOCOR) 20 MG tablet, TAKE ONE TABLET BY MOUTH ONCE A DAY AT BEDTIME FOR CHOLESTEROL., Disp: 30 tablet, Rfl: 1  BP 107/78   Pulse (!) 110   Ht 5\' 4"  (1.626 m)   Wt 175 lb (79.4 kg)   BMI 30.04 kg/m   Physical Exam Plantarflexion 15 degrees dorsiflexion neutral, skin dry with wounds clean wounds healed  Xrays: Fracture healed no complications Plan  Compression stocking to control edema weightbearing as tolerated  Try walking without walker 4 weeks follow-up

## 2017-10-06 NOTE — Telephone Encounter (Signed)
Patient called back and said she wanted us to know that she could not get measured for the compression stocking until Friday.    I told her that I would let you know.  Thanks

## 2017-10-07 ENCOUNTER — Telehealth: Payer: Self-pay | Admitting: Orthopedic Surgery

## 2017-10-07 NOTE — Telephone Encounter (Signed)
Per request/authorization on file, to [employer] fax# [phone]; patient aware.

## 2017-10-08 ENCOUNTER — Ambulatory Visit (HOSPITAL_COMMUNITY): Payer: 59

## 2017-10-08 ENCOUNTER — Other Ambulatory Visit: Payer: 59 | Admitting: Adult Health

## 2017-10-11 DIAGNOSIS — M84375A Stress fracture, left foot, initial encounter for fracture: Secondary | ICD-10-CM | POA: Diagnosis not present

## 2017-10-20 ENCOUNTER — Telehealth: Payer: Self-pay | Admitting: Orthopedic Surgery

## 2017-10-20 NOTE — Telephone Encounter (Signed)
Noted, thanks!

## 2017-10-20 NOTE — Telephone Encounter (Signed)
Patient called to let Dr. Romeo AppleHarrison know her progress since her last appointment on 10/07/17. She is not using the walker anymore. Putting weight on it and using a cane occasionally. She is making steps without that assistance. She has minimum swelling and good balance. She is doing good.

## 2017-10-28 DIAGNOSIS — J040 Acute laryngitis: Secondary | ICD-10-CM | POA: Diagnosis not present

## 2017-10-28 DIAGNOSIS — Z6830 Body mass index (BMI) 30.0-30.9, adult: Secondary | ICD-10-CM | POA: Diagnosis not present

## 2017-10-28 DIAGNOSIS — E6609 Other obesity due to excess calories: Secondary | ICD-10-CM | POA: Diagnosis not present

## 2017-11-03 ENCOUNTER — Encounter: Payer: Self-pay | Admitting: Orthopedic Surgery

## 2017-11-03 ENCOUNTER — Ambulatory Visit: Payer: 59 | Admitting: Orthopedic Surgery

## 2017-11-03 VITALS — BP 134/80 | HR 90 | Ht 64.0 in | Wt 182.0 lb

## 2017-11-03 DIAGNOSIS — Z967 Presence of other bone and tendon implants: Secondary | ICD-10-CM

## 2017-11-03 DIAGNOSIS — S82852D Displaced trimalleolar fracture of left lower leg, subsequent encounter for closed fracture with routine healing: Secondary | ICD-10-CM

## 2017-11-03 DIAGNOSIS — Z9889 Other specified postprocedural states: Secondary | ICD-10-CM

## 2017-11-03 DIAGNOSIS — Z8781 Personal history of (healed) traumatic fracture: Secondary | ICD-10-CM

## 2017-11-03 NOTE — Patient Instructions (Signed)
RTW MAY 6

## 2017-11-03 NOTE — Progress Notes (Signed)
Progress Note   Patient ID: Jacqueline Leon, female   DOB: 1956-05-08, 62 y.o.   MRN: [id number]  Chief Complaint  Patient presents with  . Post-op Follow-up    left ankle ORIF 07/12/17    62 years old status post open treatment internal fixation of the left ankle on July 12, 2017 this is postop day number 114, 16 weeks and 2 days    Review of Systems  Neurological: Negative for tingling.   Current Meds  Medication Sig  . cholecalciferol (VITAMIN D) 1000 UNITS tablet Take 1,000 Units by mouth daily.  . hydrochlorothiazide (MICROZIDE) 12.5 MG capsule TAKE ONE CAPSULE BY MOUTH DAILY.  . meclizine (ANTIVERT) 25 MG tablet Take 25 mg by mouth daily as needed for dizziness.   . naproxen sodium (ALEVE) 220 MG tablet Take 440 mg by mouth daily as needed (pain).  . simvastatin (ZOCOR) 20 MG tablet TAKE ONE TABLET BY MOUTH ONCE A DAY AT BEDTIME FOR CHOLESTEROL.    No Known Allergies   BP 134/80   Pulse 90   Ht 5\' 4"  (1.626 m)   Wt 182 lb (82.6 kg)   BMI 31.24 kg/m   Physical Exam  Constitutional: She is oriented to person, place, and time. She appears well-developed and well-nourished.  Musculoskeletal:       Feet:  Neurological: She is alert and oriented to person, place, and time.  Psychiatric: She has a normal mood and affect. Judgment normal.  Vitals reviewed.    Medical decision-making Encounter Diagnoses  Name Primary?  . S/P ORIF (open reduction internal fixation) fracture 07/12/17 Yes  . Closed trimalleolar fracture of ankle with routine healing, left    Resume normal shoe continue compression stocking return May 3 for anticipated return to work May 6      Fuller CanadaStanley Jencarlos Nicolson, MD 11/03/2017 9:39 AM

## 2017-11-09 ENCOUNTER — Telehealth: Payer: Self-pay | Admitting: Orthopedic Surgery

## 2017-11-09 DIAGNOSIS — Z8781 Personal history of (healed) traumatic fracture: Principal | ICD-10-CM

## 2017-11-09 DIAGNOSIS — Z9889 Other specified postprocedural states: Secondary | ICD-10-CM

## 2017-11-09 NOTE — Telephone Encounter (Signed)
Ms. Jacqueline Leon called this morning and wants to know if she could have PT.  She said she has been doing home exercises but feels that PT would help her be ready to go back to work on May 6th.  She wants Dr. Romeo AppleHarrison to order this for her.  Please advise her if Dr. Cherlynn JuneHarrison okays this  Thanks

## 2017-11-10 ENCOUNTER — Encounter: Payer: Self-pay | Admitting: Orthopedic Surgery

## 2017-11-10 NOTE — Telephone Encounter (Signed)
order

## 2017-11-10 NOTE — Telephone Encounter (Signed)
Put in order, called patient to advise.   [phone] is the phone number to call if she  wants to call to schedule I have advised her.

## 2017-11-11 ENCOUNTER — Other Ambulatory Visit: Payer: Self-pay | Admitting: Adult Health

## 2017-11-16 ENCOUNTER — Encounter (HOSPITAL_COMMUNITY): Payer: Self-pay

## 2017-11-16 ENCOUNTER — Ambulatory Visit (HOSPITAL_COMMUNITY): Payer: 59 | Attending: Orthopedic Surgery

## 2017-11-16 ENCOUNTER — Other Ambulatory Visit: Payer: Self-pay

## 2017-11-16 DIAGNOSIS — M25672 Stiffness of left ankle, not elsewhere classified: Secondary | ICD-10-CM | POA: Diagnosis not present

## 2017-11-16 DIAGNOSIS — R262 Difficulty in walking, not elsewhere classified: Secondary | ICD-10-CM

## 2017-11-16 DIAGNOSIS — M6281 Muscle weakness (generalized): Secondary | ICD-10-CM | POA: Diagnosis present

## 2017-11-16 NOTE — Therapy (Signed)
[place] [address] [place], [place], [address] Phone: [phone]   Fax:  [phone]  Physical Therapy Evaluation  Patient Details  Name: Jacqueline Leon MRN: [id number] Date of Birth: 01/12/1956 Referring Provider: Fuller Canada   Encounter Date: 11/16/2017  PT End of Session - 11/16/17 1828    Visit Number  1    Number of Visits  12    Date for PT Re-Evaluation  12/28/17 minireassess 12/07/17    Authorization Type  UHC - co-pay $17.00, OOP $500 - $34.00No auth., 60 combine visits - none used    Authorization - Visit Number  1    Authorization - Number of Visits  60    PT Start Time  [phone]    PT Stop Time  1035    PT Time Calculation (min)  47 min    Activity Tolerance  Patient tolerated treatment well    Behavior During Therapy  WFL for tasks assessed/performed       Past Medical History:  Diagnosis Date  . Abnormal Pap smear   . History of cervical cancer   . Hyperlipidemia   . Hypertension   . Inner ear inflammation   . Vaginal Pap smear, abnormal     Past Surgical History:  Procedure Laterality Date  . ABDOMINAL HYSTERECTOMY    . ORIF ANKLE FRACTURE Left 07/12/2017   Procedure: OPEN REDUCTION INTERNAL FIXATION (ORIF) ANKLE FRACTURE;  Surgeon: Vickki Hearing, MD;  Location: [facility];  Service: Orthopedics;  Laterality: Left;    There were no vitals filed for this visit.   Subjective Assessment - 11/16/17 1003    Subjective  Fell on snow 07/11/17; Lt trimalleolar fracture and had surgery 07/12/17. In walking boot until 11/03/17. Started walking with SPC in mid March. Feels like she is doing pretty well but still has problems with walking longer distances, going down stairs, and walking normal. Left ankle still tight. No pain to speak off.     Limitations  Lifting;Walking;House hold activities    How long can you sit comfortably?  not limited    How long can you stand comfortably?  shifting weight to right -  maybe an hour    How long can you walk comfortably?  with SPC about an hour    Patient Stated Goals  motion and get foot loosened up "so I can be foot loose and fancy free."    Currently in Pain?  No/denies         [facility] PT Assessment - 11/16/17 0001      Assessment   Medical Diagnosis  Lt ankle ORIF    Referring Provider  Fuller Canada    Onset Date/Surgical Date  07/11/18    Hand Dominance  Right    Next MD Visit  12/01/2017 possible return to work 12/06/2017    Prior Therapy  No      Precautions   Precautions  None      Restrictions   Weight Bearing Restrictions  No      Balance Screen   Has the patient fallen in the past 6 months  Yes    How many times?  1    Has the patient had a decrease in activity level because of a fear of falling?   Yes    Is the patient reluctant to leave their home because of a fear of falling?   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    Leisure  shop, spend time with grandson, 3 and 21 y/o, cook      Cognition   Overall Cognitive Status  Within Functional Limits for tasks assessed      Observation/Other Assessments   Focus on Therapeutic Outcomes (FOTO)   54% limited      Observation/Other Assessments-Edema    Edema  Figure 8      Figure 8 Edema   Figure 8 - Right   49.0    Figure 8 - Left   50.8      AROM   Left Ankle Dorsiflexion  3    Left Ankle Plantar Flexion  40    Left Ankle Inversion  15    Left Ankle Eversion  7      Strength   Left Ankle Dorsiflexion  4-/5    Left Ankle Plantar Flexion  2+/5    Left Ankle Inversion  4-/5    Left Ankle Eversion  4-/5      Transfers   Five time sit to stand comments   10.45 sec unequal weight distribution mildly       Ambulation/Gait   Ambulation/Gait  Yes    Ambulation/Gait Assistance  6: Modified independent (Device/Increase time)    Ambulation Distance (Feet)  452 Feet with SPC held in Rt hand not touching ground    Gait Pattern  Step-through pattern;Decreased step length - right;Decreased stance time - left;Decreased dorsiflexion - left;Decreased weight shift to left;Antalgic    Ambulation Surface  Level    Gait velocity  0.84 m/s      Balance   Balance Assessed  Yes    Balance comment  Rt - 30; Lt 3 sec                Objective measurements completed on examination: See above findings.              PT Education - 11/16/17 1827    Education provided  Yes    Education Details  progression of healing, planned treatment focus on ROM, MMT, balance and function    Person(s) Educated  Patient    Methods  Explanation    Comprehension  Verbalized understanding       PT Short Term Goals - 11/16/17 1836      PT SHORT TERM GOAL #1   Title  Pt will be independent in consistent performance of HEP.     Time  3    Period  Weeks    Status  New    Target Date  12/07/17      PT SHORT TERM GOAL #2   Title  Pt will increase by 100 feet with improved gait pattern.    Baseline  462 feet antalgic Lt, decreased ankle DF, unequal length steps    Time  3    Period  Weeks    Status  New      PT SHORT TERM GOAL #3   Title  Pt will improve FOTO score by 5% to indicate improved functional performance and QOL.    Baseline  54% limited    Time  3    Period  Weeks    Status  New  PT SHORT TERM GOAL #4   Title  Patient will exhibit 5 degree improvement in AROM into DF, inversion and eversion.    Baseline  DF - 3; inv - 15; ever - 7    Time  3    Period  Weeks    Status  New      PT SHORT TERM GOAL #5   Title  Patient will exhibit improved MMT of left ankle of 1/2 grade in all directions.    Baseline  see MMT    Time  3    Period  Weeks    Status  New        PT  Long Term Goals - 11/16/17 1837      PT LONG TERM GOAL #1   Title  Pt will be independent in consistent performance of advanced HEP.      PT LONG TERM GOAL #2   Title  Patient will report improved function such that she is able to play with her 27 year old grandson without limitations regarding her left ankle.    Time  6    Period  Weeks    Status  New      PT LONG TERM GOAL #3   Title  Patient will report no limitation regarding her left ankle in her ability to stand to cook or walk to shop.      PT LONG TERM GOAL #4   Title  Patient will exhibit 10 degree improvement in AROM into DF, inversion and eversion.    Baseline  DF - 3; inv - 15; ever - 7    Time  6    Period  Weeks    Status  New      PT LONG TERM GOAL #5   Title  Patient will exhibit improved MMT of left ankle of no less than a 4/5 grade in all directions.    Time  6    Period  Weeks    Status  New             Plan - 11/16/17 1830    Clinical Impression Statement  Patient is a 62 year old female s/p left ankle trimalleolar fracture and surgical repair. She has few pain complaints and is functioning relatively well considering. Patient does exhibit limitations in AROM, swelling, strength, balance, and functional ability. Patient will benefit from skilled physical therapy to address the before mentioned deficits. She would like to return to work 12/06/2017. She would also like walk and to go down stairs more normally.     History and Personal Factors relevant to plan of care:  fall on snow, HTN    Clinical Presentation due to:  swelling, AROM, MMT, FOTO, gait    Clinical Decision Making  Low    Rehab Potential  Good    Clinical Impairments Affecting Rehab Potential  positive - positive attitude, good pain control; negative - significance of fall injury    PT Frequency  2x / week    PT Duration  6 weeks    PT Treatment/Interventions  ADLs/Self Care Home Management;Cryotherapy;Electrical Stimulation;Moist Heat;Gait  training;Stair training;Functional mobility training;Neuromuscular re-education;Balance training;Therapeutic exercise;Therapeutic activities;Patient/family education;Scar mobilization;Passive range of motion;Dry needling;Energy conservation    PT Next Visit Plan  initiate therband exs 4-way seated; tandem balance and SLS, attempt stairs reciprocol, BAPS level 1       Patient will benefit from skilled therapeutic intervention in order to improve the following deficits and impairments:  Abnormal gait,  Decreased balance, Decreased mobility, Difficulty walking, Decreased range of motion, Increased edema, Decreased strength  Visit Diagnosis: Muscle weakness (generalized) - Plan: PT plan of care cert/re-cert  Difficulty in walking, not elsewhere classified - Plan: PT plan of care cert/re-cert  Stiffness of left ankle, not elsewhere classified - Plan: PT plan of care cert/re-cert     Problem List Patient Active Problem List   Diagnosis Date Noted  . S/P ORIF (open reduction internal fixation) fracture 07/12/17 07/22/2017  . Closed trimalleolar fracture of ankle with routine healing, left 07/11/2017  . History of cervical cancer 06/02/2013  . DEGENERATIVE JOINT DISEASE, RIGHT KNEE 09/09/2010  . DERANGEMENT OF POSTERIOR HORN OF MEDIAL MENISCUS 09/09/2010  . ANKLE PAIN 09/12/2008    Katina DungBarbara D. Hartnett-Rands, MS, PT Per Ladoris [facility] PT [id number] 11/16/2017, 6:50 PM  [facility] [address] [place], [place], [address] Phone: [phone]   Fax:  [phone]  Name: Jacqueline Leon MRN: [id number] Date of Birth: [birth year]

## 2017-11-18 ENCOUNTER — Ambulatory Visit (HOSPITAL_COMMUNITY): Payer: 59 | Admitting: Physical Therapy

## 2017-11-18 DIAGNOSIS — M6281 Muscle weakness (generalized): Secondary | ICD-10-CM | POA: Diagnosis not present

## 2017-11-18 DIAGNOSIS — R262 Difficulty in walking, not elsewhere classified: Secondary | ICD-10-CM

## 2017-11-18 DIAGNOSIS — M25672 Stiffness of left ankle, not elsewhere classified: Secondary | ICD-10-CM

## 2017-11-18 NOTE — Patient Instructions (Signed)
Balance: Eyes Open - Unilateral (Varied Surfaces)    Stand on left foot, eyes open. Maintain balance _30___ seconds. Repeat __3__ times per set. Do _1___ sets per session. Do __2__ sessions per day Repeat on compliant surface: foam.  Copyright  VHI. All rights reserved.  Tandem Stance    Right foot in front of left, heel touching toe both feet "straight ahead". Stand on Foot Triangle of Support with both feet. Balance in this position _30__ seconds. Do with left foot in front of right.  Copyright  VHI. All rights reserved.  Plantar Fascia Stretch    Standing with only ball of left foot on stair, push heel down until stretch is felt through arch of foot. Hold _30___ seconds. Relax. Repeat _3___ times per set. Do __1__ sets per session. Do _2___ sessions per day.  http://orth.exer.us/23   Copyright  VHI. All rights reserved.

## 2017-11-18 NOTE — Therapy (Signed)
[facility] [address] [place], [place], [address] Phone: [phone]   Fax:  [phone]  Physical Therapy Treatment  Patient Details  Name: Jacqueline NoeCynthia J Umland MRN: [id number] Date of Birth: Apr 16, 1956 Referring Provider: Fuller CanadaHarrison, Stanley   Encounter Date: 11/18/2017  PT End of Session - 11/18/17 1109    Visit Number  2    Number of Visits  12    Date for PT Re-Evaluation  12/28/17 minireassess 12/07/17    Authorization Type  UHC - co-pay $17.00, OOP $500 - $34.00No auth., 60 combine visits - none used    Authorization - Visit Number  2    Authorization - Number of Visits  60    PT Start Time  0905    PT Stop Time  0946    PT Time Calculation (min)  41 min    Activity Tolerance  Patient tolerated treatment well    Behavior During Therapy  WFL for tasks assessed/performed       Past Medical History:  Diagnosis Date  . Abnormal Pap smear   . History of cervical cancer   . Hyperlipidemia   . Hypertension   . Inner ear inflammation   . Vaginal Pap smear, abnormal     Past Surgical History:  Procedure Laterality Date  . ABDOMINAL HYSTERECTOMY    . ORIF ANKLE FRACTURE Left 07/12/2017   Procedure: OPEN REDUCTION INTERNAL FIXATION (ORIF) ANKLE FRACTURE;  Surgeon: Vickki HearingHarrison, Stanley E, MD;  Location: [facility];  Service: Orthopedics;  Laterality: Left;    There were no vitals filed for this visit.  Subjective Assessment - 11/18/17 1103    Subjective  Pt states she is having some discomfort but no real pain.  States she's still doing the exercises they gave her in the hospital.    Pertinent History  Lt trimalleolar ankle fx with surgery 07/12/17    Currently in Pain?  No/denies                       [facility] Adult PT Treatment/Exercise - 11/18/17 0001      Ankle Exercises: Stretches   Plantar Fascia Stretch  3 reps;30 seconds    Plantar Fascia Stretch Limitations  on step      Ankle Exercises: Standing   SLS   bilaterally X 3 attempts each    Other Standing Ankle Exercises  tandem stance 30" X2 each no UE's      Ankle Exercises: Seated   Other Seated Ankle Exercises  ankle theraband RTB 4 way 10 reps    Other Seated Ankle Exercises  reviewed HEP completing at home              PT Education - 11/18/17 1110    Education provided  Yes    Education Details  reveiwed evaluation and HEP.  Given additional exercises for HEP    Person(s) Educated  Patient    Methods  Explanation;Demonstration;Tactile cues;Handout;Verbal cues    Comprehension  Verbalized understanding;Returned demonstration;Verbal cues required;Tactile cues required       PT Short Term Goals - 11/16/17 1836      PT SHORT TERM GOAL #1   Title  Pt will be independent in consistent performance of HEP.     Time  3    Period  Weeks    Status  New    Target Date  12/07/17      PT SHORT TERM GOAL #2   Title  Pt  will increase by 100 feet with improved gait pattern.    Baseline  462 feet antalgic Lt, decreased ankle DF, unequal length steps    Time  3    Period  Weeks    Status  New      PT SHORT TERM GOAL #3   Title  Pt will improve FOTO score by 5% to indicate improved functional performance and QOL.    Baseline  54% limited    Time  3    Period  Weeks    Status  New      PT SHORT TERM GOAL #4   Title  Patient will exhibit 5 degree improvement in AROM into DF, inversion and eversion.    Baseline  DF - 3; inv - 15; ever - 7    Time  3    Period  Weeks    Status  New      PT SHORT TERM GOAL #5   Title  Patient will exhibit improved MMT of left ankle of 1/2 grade in all directions.    Baseline  see MMT    Time  3    Period  Weeks    Status  New        PT Long Term Goals - 11/16/17 1837      PT LONG TERM GOAL #1   Title  Pt will be independent in consistent performance of advanced HEP.      PT LONG TERM GOAL #2   Title  Patient will report improved function such that she is able to play with her 3  year old grandson without limitations regarding her left ankle.    Time  6    Period  Weeks    Status  New      PT LONG TERM GOAL #3   Title  Patient will report no limitation regarding her left ankle in her ability to stand to cook or walk to shop.      PT LONG TERM GOAL #4   Title  Patient will exhibit 10 degree improvement in AROM into DF, inversion and eversion.    Baseline  DF - 3; inv - 15; ever - 7    Time  6    Period  Weeks    Status  New      PT LONG TERM GOAL #5   Title  Patient will exhibit improved MMT of left ankle of no less than a 4/5 grade in all directions.    Time  6    Period  Weeks    Status  New            Plan - 11/18/17 1110    Clinical Impression Statement  Pt comes today using a SPC with noted antalgia.  Reveiwed HEP completing from previous therapy, evaluation goals and updated HEP this session.  PT able to complete stairs ascending reciprocally but unable to descend reciprocally.  Very limited DF as demonstrated with PF stretch.  Pt able to maintain tandem for 30 seconds, however more difficult with Lt LE in back.  SLS is challenging as well for Lt LE.      Rehab Potential  Good    Clinical Impairments Affecting Rehab Potential  positive - positive attitude, good pain control; negative - significance of fall injury    PT Frequency  2x / week    PT Duration  6 weeks    PT Treatment/Interventions  ADLs/Self Care Home Management;Cryotherapy;Electrical Stimulation;Moist Heat;Gait training;Stair  training;Functional mobility training;Neuromuscular re-education;Balance training;Therapeutic exercise;Therapeutic activities;Patient/family education;Scar mobilization;Passive range of motion;Dry needling;Energy conservation    PT Next Visit Plan  Next session add Heelraise/toeraise, BAPS level 1 and vector stance.      PT Home Exercise Plan  completeing one from hospital: ankle pumps, AROm, LAQ, bridge, HS stretch.  Added today 4/18: SLS, tandem stance, PF stretch        Patient will benefit from skilled therapeutic intervention in order to improve the following deficits and impairments:  Abnormal gait, Decreased balance, Decreased mobility, Difficulty walking, Decreased range of motion, Increased edema, Decreased strength  Visit Diagnosis: Muscle weakness (generalized)  Difficulty in walking, not elsewhere classified  Stiffness of left ankle, not elsewhere classified     Problem List Patient Active Problem List   Diagnosis Date Noted  . S/P ORIF (open reduction internal fixation) fracture 07/12/17 07/22/2017  . Closed trimalleolar fracture of ankle with routine healing, left 07/11/2017  . History of cervical cancer 06/02/2013  . DEGENERATIVE JOINT DISEASE, RIGHT KNEE 09/09/2010  . DERANGEMENT OF POSTERIOR HORN OF MEDIAL MENISCUS 09/09/2010  . ANKLE PAIN 09/12/2008   Lurena Nida, PTA/CLT [phone]  Lurena Nida 11/18/2017, 11:17 AM  [facility] [address] [place], [place], [address] Phone: [phone]   Fax:  [phone]  Name: Miamor Ayler Blatt MRN: [id number] Date of Birth: 25-Jul-1956

## 2017-11-23 ENCOUNTER — Ambulatory Visit (HOSPITAL_COMMUNITY): Payer: 59 | Admitting: Physical Therapy

## 2017-11-23 DIAGNOSIS — M6281 Muscle weakness (generalized): Secondary | ICD-10-CM

## 2017-11-23 DIAGNOSIS — M25672 Stiffness of left ankle, not elsewhere classified: Secondary | ICD-10-CM

## 2017-11-23 DIAGNOSIS — R262 Difficulty in walking, not elsewhere classified: Secondary | ICD-10-CM

## 2017-11-23 NOTE — Therapy (Signed)
[place] [facility] [address] [place], [place], [address] Phone: [phone]   Fax:  [phone]  Physical Therapy Treatment  Patient Details  Name: Jacqueline Leon MRN: [id number] Date of Birth: 02/22/1956 Referring Provider: Fuller Canada   Encounter Date: 11/23/2017  PT End of Session - 11/23/17 1028    Visit Number  3    Number of Visits  12    Date for PT Re-Evaluation  12/28/17 minireassess 12/07/17    Authorization Type  UHC - co-pay $17.00, OOP $500 - $34.00No auth., 60 combine visits - none used    Authorization - Visit Number  3    Authorization - Number of Visits  60    PT Start Time  0945    PT Stop Time  1015    PT Time Calculation (min)  30 min    Activity Tolerance  Patient tolerated treatment well    Behavior During Therapy  WFL for tasks assessed/performed       Past Medical History:  Diagnosis Date  . Abnormal Pap smear   . History of cervical cancer   . Hyperlipidemia   . Hypertension   . Inner ear inflammation   . Vaginal Pap smear, abnormal     Past Surgical History:  Procedure Laterality Date  . ABDOMINAL HYSTERECTOMY    . ORIF ANKLE FRACTURE Left 07/12/2017   Procedure: OPEN REDUCTION INTERNAL FIXATION (ORIF) ANKLE FRACTURE;  Surgeon: Vickki Hearing, MD;  Location: [facility];  Service: Orthopedics;  Laterality: Left;    There were no vitals filed for this visit.  Subjective Assessment - 11/23/17 0955    Subjective  Pt states she is more sore than really hurting but will give a 2/10 pain rating due to the soreness.    Currently in Pain?  Yes    Pain Score  2     Pain Location  Ankle    Pain Orientation  Left    Pain Descriptors / Indicators  Sore                       [facility] Adult PT Treatment/Exercise - 11/23/17 0001      Ankle Exercises: Stretches   Plantar Fascia Stretch  3 reps;30 seconds    Plantar Fascia Stretch Limitations  on step    Slant Board Stretch  3 reps;30  seconds      Ankle Exercises: Standing   BAPS  Standing;Level 1;10 reps;Limitations    BAPS Limitations  all directions 10 reps each with bil UE assist and Rt foot on board    Vector Stance  Left;5 seconds;5 reps    SLS  bilaterally 30" on Rt, 5" max on Lt X 5 attempts each    Heel Raises  Both;15 reps    Toe Raise  15 reps    Other Standing Ankle Exercises  tandem stance 30" X2 each no UE's               PT Short Term Goals - 11/16/17 1836      PT SHORT TERM GOAL #1   Title  Pt will be independent in consistent performance of HEP.     Time  3    Period  Weeks    Status  New    Target Date  12/07/17      PT SHORT TERM GOAL #2   Title  Pt will increase by 100 feet with improved gait pattern.  Baseline  462 feet antalgic Lt, decreased ankle DF, unequal length steps    Time  3    Period  Weeks    Status  New      PT SHORT TERM GOAL #3   Title  Pt will improve FOTO score by 5% to indicate improved functional performance and QOL.    Baseline  54% limited    Time  3    Period  Weeks    Status  New      PT SHORT TERM GOAL #4   Title  Patient will exhibit 5 degree improvement in AROM into DF, inversion and eversion.    Baseline  DF - 3; inv - 15; ever - 7    Time  3    Period  Weeks    Status  New      PT SHORT TERM GOAL #5   Title  Patient will exhibit improved MMT of left ankle of 1/2 grade in all directions.    Baseline  see MMT    Time  3    Period  Weeks    Status  New        PT Long Term Goals - 11/16/17 1837      PT LONG TERM GOAL #1   Title  Pt will be independent in consistent performance of advanced HEP.      PT LONG TERM GOAL #2   Title  Patient will report improved function such that she is able to play with her 63 year old grandson without limitations regarding her left ankle.    Time  6    Period  Weeks    Status  New      PT LONG TERM GOAL #3   Title  Patient will report no limitation regarding her left ankle in her ability to  stand to cook or walk to shop.      PT LONG TERM GOAL #4   Title  Patient will exhibit 10 degree improvement in AROM into DF, inversion and eversion.    Baseline  DF - 3; inv - 15; ever - 7    Time  6    Period  Weeks    Status  New      PT LONG TERM GOAL #5   Title  Patient will exhibit improved MMT of left ankle of no less than a 4/5 grade in all directions.    Time  6    Period  Weeks    Status  New            Plan - 11/23/17 1028    Clinical Impression Statement  Continued use of SPC with ambulation.  Working on HEP at home with compliance reported.  PT with some soreness today.  Progressed wtih additional therex, however ended session early as to not overload and increase soreness.  Pt with most challenge completing eversion with standing BAPS.  Able to complete SLSon Rt for 30", however max of 5" on Lt LE.  Added vector stance to help work on this and improve stability.  PT without increased symptoms at EOS.      Rehab Potential  Good    Clinical Impairments Affecting Rehab Potential  positive - positive attitude, good pain control; negative - significance of fall injury    PT Frequency  2x / week    PT Duration  6 weeks    PT Treatment/Interventions  ADLs/Self Care Home Management;Cryotherapy;Electrical Stimulation;Moist Heat;Gait training;Stair training;Functional mobility training;Neuromuscular  re-education;Balance training;Therapeutic exercise;Therapeutic activities;Patient/family education;Scar mobilization;Passive range of motion;Dry needling;Energy conservation    PT Next Visit Plan  Continue to progress ankle strength and stability.     PT Home Exercise Plan  completeing one from hospital: ankle pumps, AROm, LAQ, bridge, HS stretch.  Added today 4/18: SLS, tandem stance, PF stretch       Patient will benefit from skilled therapeutic intervention in order to improve the following deficits and impairments:  Abnormal gait, Decreased balance, Decreased mobility, Difficulty  walking, Decreased range of motion, Increased edema, Decreased strength  Visit Diagnosis: Muscle weakness (generalized)  Difficulty in walking, not elsewhere classified  Stiffness of left ankle, not elsewhere classified     Problem List Patient Active Problem List   Diagnosis Date Noted  . S/P ORIF (open reduction internal fixation) fracture 07/12/17 07/22/2017  . Closed trimalleolar fracture of ankle with routine healing, left 07/11/2017  . History of cervical cancer 06/02/2013  . DEGENERATIVE JOINT DISEASE, RIGHT KNEE 09/09/2010  . DERANGEMENT OF POSTERIOR HORN OF MEDIAL MENISCUS 09/09/2010  . ANKLE PAIN 09/12/2008   Lurena NidaAmy B Frazier, PTA/CLT [phone]  Lurena NidaFrazier, Amy B 11/23/2017, 10:32 AM  [place] [facility] [address] [place], [place], [address] Phone: [phone]   Fax:  [phone]  Name: Erie NoeCynthia J Nabers MRN: [id number] Date of Birth: 1955-10-22

## 2017-11-25 ENCOUNTER — Ambulatory Visit (HOSPITAL_COMMUNITY): Payer: 59 | Admitting: Physical Therapy

## 2017-11-25 DIAGNOSIS — M6281 Muscle weakness (generalized): Secondary | ICD-10-CM

## 2017-11-25 DIAGNOSIS — R262 Difficulty in walking, not elsewhere classified: Secondary | ICD-10-CM

## 2017-11-25 DIAGNOSIS — M25672 Stiffness of left ankle, not elsewhere classified: Secondary | ICD-10-CM

## 2017-11-25 NOTE — Therapy (Signed)
[facility] [address] [place], [place], [address] Phone: [phone]   Fax:  [phone]  Physical Therapy Treatment  Patient Details  Name: Jacqueline Leon MRN: [id number] Date of Birth: 24-May-1956 Referring Provider: Fuller Canada   Encounter Date: 11/25/2017  PT End of Session - 11/25/17 1032    Visit Number  4    Number of Visits  12    Date for PT Re-Evaluation  12/28/17 minireassess 12/07/17    Authorization Type  [employer] - co-pay $17.00, OOP $500 - $34.00No auth., 60 combine visits - none used    Authorization - Visit Number  4    Authorization - Number of Visits  60    PT Start Time  [phone]    PT Stop Time  1032    PT Time Calculation (min)  40 min    Activity Tolerance  Patient tolerated treatment well    Behavior During Therapy  WFL for tasks assessed/performed       Past Medical History:  Diagnosis Date  . Abnormal Pap smear   . History of cervical cancer   . Hyperlipidemia   . Hypertension   . Inner ear inflammation   . Vaginal Pap smear, abnormal     Past Surgical History:  Procedure Laterality Date  . ABDOMINAL HYSTERECTOMY    . ORIF ANKLE FRACTURE Left 07/12/2017   Procedure: OPEN REDUCTION INTERNAL FIXATION (ORIF) ANKLE FRACTURE;  Surgeon: Vickki Hearing, MD;  Location: [facility];  Service: Orthopedics;  Laterality: Left;    There were no vitals filed for this visit.  Subjective Assessment - 11/25/17 0952    Subjective  Pt states she was sore from the new exericses yesterday but all better now.  No pain today.    Currently in Pain?  No/denies                       [facility] Adult PT Treatment/Exercise - 11/25/17 0001      Ambulation/Gait   Gait Comments  gait trained without AD 200 feet to reduce antalgia, increase heel to toe gait      Ankle Exercises: Standing   BAPS  Standing;Level 1;10 reps;Limitations    BAPS Limitations  all directions 10 reps each with bil UE assist and Rt foot on  board    Vector Stance  Left;5 seconds;5 reps    SLS  Lt only max of 10" 5 trials no UE's    Rocker Board  2 minutes;Limitations Rt/Lt and A/P    Heel Raises  Both;15 reps    Toe Raise  15 reps    Other Standing Ankle Exercises  tandem stance 30" X2 on airex each no UE's    Other Standing Ankle Exercises  lateral, forward step ups 4", forward step downs 2" Lt only 10 reps each      Ankle Exercises: Stretches   Plantar Fascia Stretch  3 reps;30 seconds    Plantar Fascia Stretch Limitations  on step    Slant Board Stretch  3 reps;30 seconds               PT Short Term Goals - 11/16/17 1836      PT SHORT TERM GOAL #1   Title  Pt will be independent in consistent performance of HEP.     Time  3    Period  Weeks    Status  New    Target Date  12/07/17  PT SHORT TERM GOAL #2   Title  Pt will increase by 100 feet with improved gait pattern.    Baseline  462 feet antalgic Lt, decreased ankle DF, unequal length steps    Time  3    Period  Weeks    Status  New      PT SHORT TERM GOAL #3   Title  Pt will improve FOTO score by 5% to indicate improved functional performance and QOL.    Baseline  54% limited    Time  3    Period  Weeks    Status  New      PT SHORT TERM GOAL #4   Title  Patient will exhibit 5 degree improvement in AROM into DF, inversion and eversion.    Baseline  DF - 3; inv - 15; ever - 7    Time  3    Period  Weeks    Status  New      PT SHORT TERM GOAL #5   Title  Patient will exhibit improved MMT of left ankle of 1/2 grade in all directions.    Baseline  see MMT    Time  3    Period  Weeks    Status  New        PT Long Term Goals - 11/16/17 1837      PT LONG TERM GOAL #1   Title  Pt will be independent in consistent performance of advanced HEP.      PT LONG TERM GOAL #2   Title  Patient will report improved function such that she is able to play with her 5 year old grandson without limitations regarding her left ankle.    Time  6     Period  Weeks    Status  New      PT LONG TERM GOAL #3   Title  Patient will report no limitation regarding her left ankle in her ability to stand to cook or walk to shop.      PT LONG TERM GOAL #4   Title  Patient will exhibit 10 degree improvement in AROM into DF, inversion and eversion.    Baseline  DF - 3; inv - 15; ever - 7    Time  6    Period  Weeks    Status  New      PT LONG TERM GOAL #5   Title  Patient will exhibit improved MMT of left ankle of no less than a 4/5 grade in all directions.    Time  6    Period  Weeks    Status  New            Plan - 11/25/17 1045    Clinical Impression Statement  continued with stretches to increase Lt ankle ROM.  Noted improvement, ROM and control with BAPS actvitiy.  Added airex to tandem to increase challenge and able to SLS on Lt today for 10 seconds (was only 5" max).  Began rockerboard and step ups to increase strength and stability.  PT reported good work out at end of session but no increased pain or discomfort.      Rehab Potential  Good    Clinical Impairments Affecting Rehab Potential  positive - positive attitude, good pain control; negative - significance of fall injury    PT Frequency  2x / week    PT Duration  6 weeks    PT Treatment/Interventions  ADLs/Self Care  Home Management;Cryotherapy;Electrical Stimulation;Moist Heat;Gait training;Stair training;Functional mobility training;Neuromuscular re-education;Balance training;Therapeutic exercise;Therapeutic activities;Patient/family education;Scar mobilization;Passive range of motion;Dry needling;Energy conservation    PT Next Visit Plan  Continue to progress ankle strength and stability. Next session increase BAPS to L2 and add UE activity with tandem on foam.     PT Home Exercise Plan  completeing one from hospital: ankle pumps, AROm, LAQ, bridge, HS stretch.  Added today 4/18: SLS, tandem stance, PF stretch       Patient will benefit from skilled therapeutic  intervention in order to improve the following deficits and impairments:  Abnormal gait, Decreased balance, Decreased mobility, Difficulty walking, Decreased range of motion, Increased edema, Decreased strength  Visit Diagnosis: Muscle weakness (generalized)  Difficulty in walking, not elsewhere classified  Stiffness of left ankle, not elsewhere classified     Problem List Patient Active Problem List   Diagnosis Date Noted  . S/P ORIF (open reduction internal fixation) fracture 07/12/17 07/22/2017  . Closed trimalleolar fracture of ankle with routine healing, left 07/11/2017  . History of cervical cancer 06/02/2013  . DEGENERATIVE JOINT DISEASE, RIGHT KNEE 09/09/2010  . DERANGEMENT OF POSTERIOR HORN OF MEDIAL MENISCUS 09/09/2010  . ANKLE PAIN 09/12/2008   Lurena NidaAmy B Kali Ambler, PTA/CLT [phone]  Lurena NidaFrazier, Briannon Boggio B 11/25/2017, 10:49 AM  [facility] [address] [place], [place], [address] Phone: [phone]   Fax:  [phone]  Name: Erie NoeCynthia J Hornbeck MRN: [id number] Date of Birth: 11/26/1955

## 2017-11-26 ENCOUNTER — Ambulatory Visit (HOSPITAL_COMMUNITY): Payer: 59

## 2017-11-26 ENCOUNTER — Other Ambulatory Visit: Payer: 59 | Admitting: Adult Health

## 2017-11-30 ENCOUNTER — Encounter (HOSPITAL_COMMUNITY): Payer: Self-pay | Admitting: Physical Therapy

## 2017-11-30 ENCOUNTER — Ambulatory Visit (HOSPITAL_COMMUNITY): Payer: 59 | Admitting: Physical Therapy

## 2017-11-30 DIAGNOSIS — M6281 Muscle weakness (generalized): Secondary | ICD-10-CM

## 2017-11-30 DIAGNOSIS — M25672 Stiffness of left ankle, not elsewhere classified: Secondary | ICD-10-CM

## 2017-11-30 DIAGNOSIS — R262 Difficulty in walking, not elsewhere classified: Secondary | ICD-10-CM

## 2017-11-30 NOTE — Therapy (Signed)
[facility] [address] [place], [place], [address] Phone: [phone]   Fax:  [phone]  Physical Therapy Treatment  Patient Details  Name: Jacqueline Leon MRN: [id number] Date of Birth: 20-Sep-1955 Referring Provider: Fuller Canada   Encounter Date: 11/30/2017  PT End of Session - 11/30/17 1003    Visit Number  5    Number of Visits  12    Date for PT Re-Evaluation  12/28/17 minireassess 12/07/17    Authorization Type  UHC - co-pay $17.00, OOP $500 - $34.00No auth., 60 combine visits - none used    Authorization - Visit Number  5    Authorization - Number of Visits  60    PT Start Time  [phone]    PT Stop Time  1030    PT Time Calculation (min)  40 min    Activity Tolerance  Patient tolerated treatment well    Behavior During Therapy  WFL for tasks assessed/performed       Past Medical History:  Diagnosis Date  . Abnormal Pap smear   . History of cervical cancer   . Hyperlipidemia   . Hypertension   . Inner ear inflammation   . Vaginal Pap smear, abnormal     Past Surgical History:  Procedure Laterality Date  . ABDOMINAL HYSTERECTOMY    . ORIF ANKLE FRACTURE Left 07/12/2017   Procedure: OPEN REDUCTION INTERNAL FIXATION (ORIF) ANKLE FRACTURE;  Surgeon: Vickki Hearing, MD;  Location: [facility];  Service: Orthopedics;  Laterality: Left;    There were no vitals filed for this visit.  Subjective Assessment - 11/30/17 0956    Subjective  Pt has been doing the exercises at home.  Everyday is getting better.     Pertinent History  Lt trimalleolar ankle fx with surgery 07/12/17    Currently in Pain?  No/denies              [facility] Adult PT Treatment/Exercise - 11/30/17 0001      Exercises   Exercises  Ankle      Ankle Exercises: Standing   BAPS  Standing;Level 2;10 reps;Limitations    BAPS Limitations  all directions     SLS  LT only     Rocker Board Limitations  AP x 10     Balance Master: Limits for Stability   1:38    Balance Master: Static  1:00    Heel Raises  10 reps on LT only     Other Standing Ankle Exercises  tandem stance on foam B UE flexion 2, abduction 2 and head turns x2 all x 3 reps     Other Standing Ankle Exercises  lunge onto bosu x 10       Ankle Exercises: Stretches   Plantar Fascia Stretch  3 reps;30 seconds    Plantar Fascia Stretch Limitations  on step    Slant Board Stretch  3 reps;30 seconds          PT Short Term Goals - 11/16/17 1836      PT SHORT TERM GOAL #1   Title  Pt will be independent in consistent performance of HEP.     Time  3    Period  Weeks    Status  New    Target Date  12/07/17      PT SHORT TERM GOAL #2   Title  Pt will increase by 100 feet with improved gait pattern.    Baseline  462  feet antalgic Lt, decreased ankle DF, unequal length steps    Time  3    Period  Weeks    Status  New      PT SHORT TERM GOAL #3   Title  Pt will improve FOTO score by 5% to indicate improved functional performance and QOL.    Baseline  54% limited    Time  3    Period  Weeks    Status  New      PT SHORT TERM GOAL #4   Title  Patient will exhibit 5 degree improvement in AROM into DF, inversion and eversion.    Baseline  DF - 3; inv - 15; ever - 7    Time  3    Period  Weeks    Status  New      PT SHORT TERM GOAL #5   Title  Patient will exhibit improved MMT of left ankle of 1/2 grade in all directions.    Baseline  see MMT    Time  3    Period  Weeks    Status  New        PT Long Term Goals - 11/16/17 1837      PT LONG TERM GOAL #1   Title  Pt will be independent in consistent performance of advanced HEP.      PT LONG TERM GOAL #2   Title  Patient will report improved function such that she is able to play with her 11 year old grandson without limitations regarding her left ankle.    Time  6    Period  Weeks    Status  New      PT LONG TERM GOAL #3   Title  Patient will report no limitation regarding her left ankle in her ability  to stand to cook or walk to shop.      PT LONG TERM GOAL #4   Title  Patient will exhibit 10 degree improvement in AROM into DF, inversion and eversion.    Baseline  DF - 3; inv - 15; ever - 7    Time  6    Period  Weeks    Status  New      PT LONG TERM GOAL #5   Title  Patient will exhibit improved MMT of left ankle of no less than a 4/5 grade in all directions.    Time  6    Period  Weeks    Status  New            Plan - 11/30/17 1005    Clinical Impression Statement  Added lunging on bosu, single heel raise, rockerboard with no UE assist and balance master, increased to level 2 on baps. PT needs minimal cuing to keep ;proper technique.      Rehab Potential  Good    Clinical Impairments Affecting Rehab Potential  positive - positive attitude, good pain control; negative - significance of fall injury    PT Frequency  2x / week    PT Duration  6 weeks    PT Treatment/Interventions  ADLs/Self Care Home Management;Cryotherapy;Electrical Stimulation;Moist Heat;Gait training;Stair training;Functional mobility training;Neuromuscular re-education;Balance training;Therapeutic exercise;Therapeutic activities;Patient/family education;Scar mobilization;Passive range of motion;Dry needling;Energy conservation    PT Next Visit Plan  begin heel and toe walk activity. Decrease balance master tasks to level 6.     PT Home Exercise Plan  completeing one from hospital: ankle pumps, AROm, LAQ, bridge, HS stretch.  Added today  4/18: SLS, tandem stance, PF stretch       Patient will benefit from skilled therapeutic intervention in order to improve the following deficits and impairments:  Abnormal gait, Decreased balance, Decreased mobility, Difficulty walking, Decreased range of motion, Increased edema, Decreased strength  Visit Diagnosis: Muscle weakness (generalized)  Difficulty in walking, not elsewhere classified  Stiffness of left ankle, not elsewhere classified     Problem  List Patient Active Problem List   Diagnosis Date Noted  . S/P ORIF (open reduction internal fixation) fracture 07/12/17 07/22/2017  . Closed trimalleolar fracture of ankle with routine healing, left 07/11/2017  . History of cervical cancer 06/02/2013  . DEGENERATIVE JOINT DISEASE, RIGHT KNEE 09/09/2010  . DERANGEMENT OF POSTERIOR HORN OF MEDIAL MENISCUS 09/09/2010  . ANKLE PAIN 09/12/2008    Virgina Organ, PT CLT [phone] 11/30/2017, 10:28 AM  [facility] [address] [place], [place], [address] Phone: [phone]   Fax:  [phone]  Name: Jaquasha Carnevale Marchant MRN: [id number] Date of Birth: 05/27/1956

## 2017-12-01 ENCOUNTER — Ambulatory Visit: Payer: 59 | Admitting: Orthopedic Surgery

## 2017-12-01 ENCOUNTER — Encounter: Payer: Self-pay | Admitting: Orthopedic Surgery

## 2017-12-01 ENCOUNTER — Ambulatory Visit (INDEPENDENT_AMBULATORY_CARE_PROVIDER_SITE_OTHER): Payer: 59

## 2017-12-01 VITALS — BP 135/82 | HR 87 | Ht 64.0 in | Wt 175.0 lb

## 2017-12-01 DIAGNOSIS — Z967 Presence of other bone and tendon implants: Secondary | ICD-10-CM

## 2017-12-01 DIAGNOSIS — Z9889 Other specified postprocedural states: Secondary | ICD-10-CM

## 2017-12-01 DIAGNOSIS — S82852D Displaced trimalleolar fracture of left lower leg, subsequent encounter for closed fracture with routine healing: Secondary | ICD-10-CM | POA: Diagnosis not present

## 2017-12-01 DIAGNOSIS — Z8781 Personal history of (healed) traumatic fracture: Secondary | ICD-10-CM

## 2017-12-01 NOTE — Progress Notes (Signed)
Follow-up visit  POD # 142, 4 MONTHS PLUS  BP 135/82   Pulse 87   Ht  (1.626 m)   Wt 175 lb (79.4 kg)   BMI 30.04 kg/m   Chief Complaint  Patient presents with  . Post-op Follow-up    left ankle ORIF 07/12/18    Encounter Diagnoses  Name Primary?  . S/P ORIF (open reduction internal fixation) fracture 07/12/17 Yes  . Closed trimalleolar fracture of ankle with routine healing, left   62 S/P ORIF ANKLE 4 MONTHS (ALMOST 5) AGO PRESENTS FR ROUTINE FU with very minimal discomfort or complaints.  She got good relief with therapy improved her range of motion and gait pattern.     Review of Systems  Constitutional: Negative for chills and fever.  Skin: Negative.   Neurological: Negative for tingling and weakness.    Physical Exam  Constitutional: She is oriented to person, place, and time. She appears well-developed and well-nourished.  Musculoskeletal:       Feet:  Neurological: She is alert and oriented to person, place, and time. Gait normal.  Psychiatric: She has a normal mood and affect. Judgment normal.  Vitals reviewed.    Assessment and plan.  The patient has exhibited excellent range of motion with neutral plantigrade foot stable ankle neurovascular exam is intact she does have some scarring from the injury with the initial fracture blisters.  She will complete her course of physical therapy and then resume all normal activities.  She will wear an ankle sleeve which she will get over-the-counter   Images show healed fracture left ankle no hardware complications see dictated report  Plan routine activities as tolerated

## 2017-12-02 ENCOUNTER — Ambulatory Visit (HOSPITAL_COMMUNITY): Payer: 59 | Attending: Orthopedic Surgery | Admitting: Physical Therapy

## 2017-12-02 DIAGNOSIS — R262 Difficulty in walking, not elsewhere classified: Secondary | ICD-10-CM

## 2017-12-02 DIAGNOSIS — M25672 Stiffness of left ankle, not elsewhere classified: Secondary | ICD-10-CM | POA: Diagnosis not present

## 2017-12-02 DIAGNOSIS — M6281 Muscle weakness (generalized): Secondary | ICD-10-CM | POA: Diagnosis present

## 2017-12-02 NOTE — Therapy (Signed)
[facility] [address] [place], [place], [address] Phone: [phone]   Fax:  [phone]  Physical Therapy Treatment  Patient Details  Name: Jacqueline Leon MRN: [id number] Date of Birth: November 29, 1955 Referring Provider: Fuller Canada   Encounter Date: 12/02/2017  PT End of Session - 12/02/17 1142    Visit Number  6    Number of Visits  12    Date for PT Re-Evaluation  12/28/17 minireassess 12/07/17    Authorization Type  UHC - co-pay $17.00, OOP $500 - $34.00No auth., 60 combine visits - none used    Authorization - Visit Number  6    Authorization - Number of Visits  60    PT Start Time  1035    PT Stop Time  1115    PT Time Calculation (min)  40 min    Activity Tolerance  Patient tolerated treatment well    Behavior During Therapy  WFL for tasks assessed/performed       Past Medical History:  Diagnosis Date  . Abnormal Pap smear   . History of cervical cancer   . Hyperlipidemia   . Hypertension   . Inner ear inflammation   . Vaginal Pap smear, abnormal     Past Surgical History:  Procedure Laterality Date  . ABDOMINAL HYSTERECTOMY    . ORIF ANKLE FRACTURE Left 07/12/2017   Procedure: OPEN REDUCTION INTERNAL FIXATION (ORIF) ANKLE FRACTURE;  Surgeon: Vickki Hearing, MD;  Location: [facility];  Service: Orthopedics;  Laterality: Left;    There were no vitals filed for this visit.  Subjective Assessment - 12/02/17 1037    Subjective  Pt states she went to the MD yesterday and he is very pleased with her progress and released her.  Currently without pain.  STates her balance/stability is her major limitation at this point.     Currently in Pain?  No/denies                       [facility] Adult PT Treatment/Exercise - 12/02/17 0001      Ambulation/Gait   Gait Comments  no AD at faster pace 400 feet      Ankle Exercises: Standing   BAPS  Standing;10 reps;Limitations;Level 3    BAPS Limitations  all  directions     SLS  LT only max 6"     Balance Master: Limits for Stability   1:36, stability 6, mod difficutly, 1 finger assist,     Balance Master: Static  1:00 X 2 , stability at 6 bilateral LE's without UE assist    Heel Raises  Left;10 reps single leg with UE assist    Heel Walk (Round Trip)  2 RT    Toe Walk (Round Trip)  2 RT    Side Shuffle (Round Trip)  lateral step up 4", forward with opposite knee drive, forward step down  4" 10 reps all without UE assist    Other Standing Ankle Exercises  tandem stance on foam B UE flexion 2, abduction 2 and head turns x2 all x 3 reps     Other Standing Ankle Exercises  lunge onto bosu x 15       Ankle Exercises: Stretches   Plantar Fascia Stretch  3 reps;30 seconds    Plantar Fascia Stretch Limitations  on step    Slant Board Stretch  3 reps;30 seconds  PT Short Term Goals - 11/16/17 1836      PT SHORT TERM GOAL #1   Title  Pt will be independent in consistent performance of HEP.     Time  3    Period  Weeks    Status  New    Target Date  12/07/17      PT SHORT TERM GOAL #2   Title  Pt will increase by 100 feet with improved gait pattern.    Baseline  462 feet antalgic Lt, decreased ankle DF, unequal length steps    Time  3    Period  Weeks    Status  New      PT SHORT TERM GOAL #3   Title  Pt will improve FOTO score by 5% to indicate improved functional performance and QOL.    Baseline  54% limited    Time  3    Period  Weeks    Status  New      PT SHORT TERM GOAL #4   Title  Patient will exhibit 5 degree improvement in AROM into DF, inversion and eversion.    Baseline  DF - 3; inv - 15; ever - 7    Time  3    Period  Weeks    Status  New      PT SHORT TERM GOAL #5   Title  Patient will exhibit improved MMT of left ankle of 1/2 grade in all directions.    Baseline  see MMT    Time  3    Period  Weeks    Status  New        PT Long Term Goals - 11/16/17 1837      PT LONG TERM GOAL #1    Title  Pt will be independent in consistent performance of advanced HEP.      PT LONG TERM GOAL #2   Title  Patient will report improved function such that she is able to play with her 62 year old grandson without limitations regarding her left ankle.    Time  6    Period  Weeks    Status  New      PT LONG TERM GOAL #3   Title  Patient will report no limitation regarding her left ankle in her ability to stand to cook or walk to shop.      PT LONG TERM GOAL #4   Title  Patient will exhibit 10 degree improvement in AROM into DF, inversion and eversion.    Baseline  DF - 3; inv - 15; ever - 7    Time  6    Period  Weeks    Status  New      PT LONG TERM GOAL #5   Title  Patient will exhibit improved MMT of left ankle of no less than a 4/5 grade in all directions.    Time  6    Period  Weeks    Status  New            Plan - 12/02/17 1147    Clinical Impression Statement  Pt progressing nicely with stability being her most limiting factor at this point.  Able to progress to Level 3 on BAPS, level 6 on balance master and single leg heelraises.  began step ups/downs with limited UE assistance, heel and toe walking.  Pt wtih good gait pattern without antalgia.  PT able to complete all activites wtihout c/o pain,  just instability noted with single leg actvities.      Rehab Potential  Good    Clinical Impairments Affecting Rehab Potential  positive - positive attitude, good pain control; negative - significance of fall injury    PT Frequency  2x / week    PT Duration  6 weeks    PT Treatment/Interventions  ADLs/Self Care Home Management;Cryotherapy;Electrical Stimulation;Moist Heat;Gait training;Stair training;Functional mobility training;Neuromuscular re-education;Balance training;Therapeutic exercise;Therapeutic activities;Patient/family education;Scar mobilization;Passive range of motion;Dry needling;Energy conservation    PT Next Visit Plan  continue to progress single leg stability.   attempt balance master with Lt only next session.    PT Home Exercise Plan  completeing one from hospital: ankle pumps, AROm, LAQ, bridge, HS stretch.  Added today 4/18: SLS, tandem stance, PF stretch       Patient will benefit from skilled therapeutic intervention in order to improve the following deficits and impairments:  Abnormal gait, Decreased balance, Decreased mobility, Difficulty walking, Decreased range of motion, Increased edema, Decreased strength  Visit Diagnosis: Muscle weakness (generalized)  Difficulty in walking, not elsewhere classified  Stiffness of left ankle, not elsewhere classified     Problem List Patient Active Problem List   Diagnosis Date Noted  . S/P ORIF (open reduction internal fixation) fracture 07/12/17 07/22/2017  . Closed trimalleolar fracture of ankle with routine healing, left 07/11/2017  . History of cervical cancer 06/02/2013  . DEGENERATIVE JOINT DISEASE, RIGHT KNEE 09/09/2010  . DERANGEMENT OF POSTERIOR HORN OF MEDIAL MENISCUS 09/09/2010  . ANKLE PAIN 09/12/2008   Lurena Nida, PTA/CLT [phone]  Lurena Nida 12/02/2017, 11:51 AM  [facility] [address] [place], [place], [address] Phone: [phone]   Fax:  [phone]  Name: Jacqueline Leon MRN: [id number] Date of Birth: 1956/04/05

## 2017-12-07 ENCOUNTER — Encounter (HOSPITAL_COMMUNITY): Payer: Self-pay

## 2017-12-07 ENCOUNTER — Ambulatory Visit (HOSPITAL_COMMUNITY): Payer: 59

## 2017-12-07 DIAGNOSIS — R262 Difficulty in walking, not elsewhere classified: Secondary | ICD-10-CM

## 2017-12-07 DIAGNOSIS — M6281 Muscle weakness (generalized): Secondary | ICD-10-CM

## 2017-12-07 DIAGNOSIS — M25672 Stiffness of left ankle, not elsewhere classified: Secondary | ICD-10-CM

## 2017-12-07 NOTE — Patient Instructions (Signed)
Access Code: [email or web address]  URL: [email or web address]  Date: 12/07/2017  Prepared by: Jac Canavan   Exercises Single Leg Stance on Pillow - 10 reps - 3 sets - 1x daily - 7x weekly Single Leg Stance with 3-Way Kick on Foam - 10 reps - 3 sets - 1x daily - 7x weekly Foam Balance Tandem stance - 10 reps - 3 sets - 1x daily - 7x weekly Gastroc Stretch on Wall - 10 reps - 3 sets - 1x daily - 7x weekly Standing Ankle Dorsiflexion Stretch on Chair - 10 reps - 3 sets - 1x daily - 7x weekly Standing Single Leg Heel Raise - 10 reps - 3 sets - 1x daily - 7x weekly Toe Raises with Counter Support - 10 reps - 3 sets - 1x daily - 7x weekly

## 2017-12-07 NOTE — Therapy (Signed)
[place], [place], [address] Phone: [phone]   Fax:  [phone]   Progress Note Reporting Period 11/16/17 to 12/07/17  See note below for Objective Data and Assessment of Progress/Goals.     Physical Therapy Treatment/Reassessment   Patient Details  Name: Jacqueline Leon MRN: [id number] Date of Birth: Oct 26, 1955 Referring Provider: Arther Abbott, MD   Encounter Date: 12/07/2017  PT End of Session - 12/07/17 0945    Visit Number  7    Number of Visits  12    Date for PT Re-Evaluation  01/04/18    Authorization Type  [employer]    Authorization Time Period  NEW: 12/07/17 to 01/04/18 (HEP POC for 4 weeks)    Authorization - Visit Number  7    Authorization - Number of Visits  60    PT Start Time  [phone]    PT Stop Time  1023    PT Time Calculation (min)  35 min    Activity Tolerance  Patient tolerated treatment well    Behavior During Therapy  [facility] for tasks assessed/performed       Past Medical History:  Diagnosis Date  . Abnormal Pap smear   . History of cervical cancer   . Hyperlipidemia   . Hypertension   . Inner ear inflammation   . Vaginal Pap smear, abnormal     Past Surgical History:  Procedure Laterality Date  . ABDOMINAL HYSTERECTOMY    . ORIF ANKLE FRACTURE Left 07/12/2017   Procedure: OPEN REDUCTION INTERNAL FIXATION (ORIF) ANKLE FRACTURE;  Surgeon: Carole Civil, MD;  Location: [facility];  Service: Orthopedics;  Laterality: Left;    There were no vitals filed for this visit.  Subjective Assessment - 12/07/17 0945    Subjective  Pt reports that her ankle is getting there. She is feeling better everyday.     Currently in Pain?  No/denies           [facility] PT Assessment - 12/07/17 0001      Assessment   Medical Diagnosis  Lt ankle ORIF    Referring Provider  Arther Abbott, MD    Next MD Visit  no f/u, he released her last f/u    Prior Therapy  No      Observation/Other Assessments    Focus on Therapeutic Outcomes (FOTO)   27% limited was 54% limitation      AROM   Left Ankle Dorsiflexion  0 was 3    Left Ankle Plantar Flexion  55 was 40    Left Ankle Inversion  17 was 15    Left Ankle Eversion  15 was 7      Strength   Left Ankle Dorsiflexion  4+/5 was 4-    Left Ankle Plantar Flexion  4/5 decreased ROM on 9 reps; was 2+    Left Ankle Inversion  5/5 was 4-    Left Ankle Eversion  4+/5 was 4-      Ambulation/Gait   Ambulation/Gait  Yes    Ambulation Distance (Feet)  808 Feet was 452    Assistive device  None    Gait Pattern  Within Functional Limits          PT Education - 12/07/17 0949    Education provided  Yes    Education Details  reassessment findings    Person(s) Educated  Patient    Methods  Explanation    Comprehension  Verbalized understanding  PT Short Term Goals - 12/07/17 0949      PT SHORT TERM GOAL #1   Title  Pt will be independent in consistent performance of HEP.     Time  3    Period  Weeks    Status  Achieved      PT SHORT TERM GOAL #2   Title  Pt will increase 3MWT by 100 feet with improved gait pattern.    Baseline  5/7: 84f, no AD, gait WFL    Time  3    Period  Weeks    Status  Achieved      PT SHORT TERM GOAL #3   Title  Pt will improve FOTO score by 5% to indicate improved functional performance and QOL.    Baseline  5/7: 2% limited    Time  3    Period  Weeks    Status  Achieved      PT SHORT TERM GOAL #4   Title  Patient will exhibit 5 degree improvement in AROM into DF, inversion and eversion.    Baseline  5/7: DF 0 (neutral), inv 17, ev 15 deg    Time  3    Period  Weeks    Status  Partially Met      PT SHORT TERM GOAL #5   Title  Patient will exhibit improved MMT of left ankle of 1/2 grade in all directions.    Baseline  5/7: see MMT    Time  3    Period  Weeks    Status  Achieved        PT Long Term Goals - 12/07/17 0950      PT LONG TERM GOAL #1   Title  Pt will be independent in  consistent performance of advanced HEP.    Baseline  5/7: is performing HEP consistently and compliant with additions    Status  Achieved      PT LONG TERM GOAL #2   Title  Patient will report improved function such that she is able to play with her 362year old grandson without limitations regarding her left ankle.    Baseline  5/7: able to play with 3YO grandson easily    Time  6    Period  Weeks    Status  Achieved      PT LONG TERM GOAL #3   Title  Patient will report no limitation regarding her left ankle in her ability to stand to cook or walk to shop.    Baseline  5/7: able to shop and walk without difficulty    Status  Achieved      PT LONG TERM GOAL #4   Title  Patient will exhibit 10 degree improvement in AROM into DF, inversion and eversion.    Baseline  5/7: DF 0 (neutral), inv 17, ev 15 deg    Time  6    Period  Weeks    Status  Partially Met      PT LONG TERM GOAL #5   Title  Patient will exhibit improved MMT of left ankle of no less than a 4/5 grade in all directions.    Time  6    Period  Weeks    Status  Achieved            Plan - 12/07/17 1026    Clinical Impression Statement  PT reassessed pt's goals and outcome measures. She has made tremendous improvements overall as illustrated above.  Her ROM, strength, and overall functional strength have all improved. She was able to ambulate 871f during the 3MWT with no AD, whereas she was only able to ambulate 452 with SPC on initial eval. She feels 85% improved since starting therapy, stating that the only limiting factor remaining is her balance. Due to great progress made, pt will be placed on 4-week HEP POC focusing mainly on her balance and will have 1 f/u appointment in case she feels the need to return to therapy due to any unforeseen setbacks. PT provided pt with updated HEP and pt was agreeable to this plan. PT educated pt that if she felt comfortable and did not feel the need to return to therapy after 4 weeks,  then she should call and cancel her appointment and she will be discharged at that time; she verbalized understanding.      Rehab Potential  Good    Clinical Impairments Affecting Rehab Potential  positive - positive attitude, good pain control; negative - significance of fall injury    PT Frequency  Other (comment)    PT Duration  4 weeks HEP POC for 4 weeks with 1 f/u visit scheduled    PT Treatment/Interventions  ADLs/Self Care Home Management;Cryotherapy;Electrical Stimulation;Moist Heat;Gait training;Stair training;Functional mobility training;Neuromuscular re-education;Balance training;Therapeutic exercise;Therapeutic activities;Patient/family education;Scar mobilization;Passive range of motion;Dry needling;Energy conservation    PT Next Visit Plan  placed on HEP POC for 4-weeks; will reassess or d/c after 4 weeks; continue to progress single leg stability.  attempt balance master with Lt only next session.    PT Home Exercise Plan  completeing one from hospital: ankle pumps, AROm, LAQ, bridge, HS stretch.  Added today 4/18: SLS, tandem stance, PF stretch; 5/7: SLS, tandem stance, tandem gait on foam/compliant surfaces, single leg heel raises, toe raises/single leg toe raises, standing calf stretch and knee drives on step for ankle DF    Consulted and Agree with Plan of Care  Patient       Patient will benefit from skilled therapeutic intervention in order to improve the following deficits and impairments:  Abnormal gait, Decreased balance, Decreased mobility, Difficulty walking, Decreased range of motion, Increased edema, Decreased strength  Visit Diagnosis: Muscle weakness (generalized) - Plan: PT plan of care cert/re-cert  Difficulty in walking, not elsewhere classified - Plan: PT plan of care cert/re-cert  Stiffness of left ankle, not elsewhere classified - Plan: PT plan of care cert/re-cert     Problem List Patient Active Problem List   Diagnosis Date Noted  . S/P ORIF (open  reduction internal fixation) fracture 07/12/17 07/22/2017  . Closed trimalleolar fracture of ankle with routine healing, left 07/11/2017  . History of cervical cancer 06/02/2013  . DEGENERATIVE JOINT DISEASE, RIGHT KNEE 09/09/2010  . DERANGEMENT OF POSTERIOR HORN OF MEDIAL MENISCUS 09/09/2010  . ANKLE PAIN 09/12/2008        BGeraldine SolarPT, DPT  [place] [address]Phone: [phone]  Fax:  [phone] Name: Jacqueline Leon MRN: [id number] of Birth: 324-Feb-1957

## 2017-12-08 ENCOUNTER — Other Ambulatory Visit: Payer: Self-pay | Admitting: Adult Health

## 2017-12-09 ENCOUNTER — Ambulatory Visit (HOSPITAL_COMMUNITY): Payer: 59

## 2017-12-14 ENCOUNTER — Encounter (HOSPITAL_COMMUNITY): Payer: 59

## 2017-12-16 ENCOUNTER — Encounter (HOSPITAL_COMMUNITY): Payer: 59

## 2017-12-17 ENCOUNTER — Ambulatory Visit (INDEPENDENT_AMBULATORY_CARE_PROVIDER_SITE_OTHER): Payer: 59 | Admitting: Adult Health

## 2017-12-17 ENCOUNTER — Encounter: Payer: Self-pay | Admitting: Adult Health

## 2017-12-17 ENCOUNTER — Ambulatory Visit (HOSPITAL_COMMUNITY)
Admission: RE | Admit: 2017-12-17 | Discharge: 2017-12-17 | Disposition: A | Discharge status: Home / Self Care | Payer: 59 | Source: Ambulatory Visit | Attending: Adult Health | Admitting: Adult Health

## 2017-12-17 ENCOUNTER — Encounter (HOSPITAL_COMMUNITY): Payer: Self-pay

## 2017-12-17 VITALS — BP 128/80 | HR 82 | Resp 16 | Ht 64.0 in | Wt 179.0 lb

## 2017-12-17 DIAGNOSIS — Z8541 Personal history of malignant neoplasm of cervix uteri: Secondary | ICD-10-CM

## 2017-12-17 DIAGNOSIS — E7800 Pure hypercholesterolemia, unspecified: Secondary | ICD-10-CM | POA: Diagnosis not present

## 2017-12-17 DIAGNOSIS — Z1211 Encounter for screening for malignant neoplasm of colon: Secondary | ICD-10-CM | POA: Diagnosis not present

## 2017-12-17 DIAGNOSIS — Z1212 Encounter for screening for malignant neoplasm of rectum: Secondary | ICD-10-CM | POA: Diagnosis not present

## 2017-12-17 DIAGNOSIS — Z01419 Encounter for gynecological examination (general) (routine) without abnormal findings: Secondary | ICD-10-CM | POA: Insufficient documentation

## 2017-12-17 DIAGNOSIS — Z1231 Encounter for screening mammogram for malignant neoplasm of breast: Secondary | ICD-10-CM

## 2017-12-17 DIAGNOSIS — L409 Psoriasis, unspecified: Secondary | ICD-10-CM | POA: Diagnosis not present

## 2017-12-17 DIAGNOSIS — Z6829 Body mass index (BMI) 29.0-29.9, adult: Secondary | ICD-10-CM | POA: Diagnosis not present

## 2017-12-17 DIAGNOSIS — E663 Overweight: Secondary | ICD-10-CM | POA: Diagnosis not present

## 2017-12-17 DIAGNOSIS — I10 Essential (primary) hypertension: Secondary | ICD-10-CM | POA: Diagnosis not present

## 2017-12-17 LAB — HEMOCCULT GUIAC POC 1CARD (OFFICE): FECAL OCCULT BLD: NEGATIVE

## 2017-12-17 NOTE — Progress Notes (Addendum)
Patient ID: Jacqueline Leon, female   DOB: 1956/01/01, 62 y.o.   MRN: [id number] History of Present Illness: Jacqueline Leon is a 62 year old black female in for a well woman gyn exam, she is ps hysterectomy for cervical cancer.She had a fractured ankle 07/11/17 and surgery the next day.  PCP is Dr Phillips Odor.   Current Medications, Allergies, Past Medical History, Past Surgical History, Family History and Social History were reviewed in [name] record.     Review of Systems:  Patient denies any headaches, hearing loss, fatigue, blurred vision, shortness of breath, chest pain, abdominal pain, problems with bowel movements, urination, or intercourse. No joint pain or mood swings.   Physical Exam:BP 128/80 (BP Location: Left Arm, Patient Position: Sitting, Cuff Size: Normal)   Pulse 82   Resp 16   Ht  (1.626 m)   Wt 179 lb (81.2 kg)   BMI 30.73 kg/m  General:  Well developed, well nourished, no acute distress Skin:  Warm and dry Neck:  Midline trachea, normal thyroid, good ROM, no lymphadenopathy,no carotid bruits heard  Lungs; Clear to auscultation bilaterally Breast:  No dominant palpable mass, retraction, or nipple discharge Cardiovascular: Regular rate and rhythm Abdomen:  Soft, non tender, no hepatosplenomegaly Pelvic:  External genitalia is normal in appearance, no lesions.  The vagina is normal in appearance. Urethra has no lesions or masses. The cervix and uterus are absent. No adnexal masses or tenderness noted.Bladder is non tender, no masses felt. Rectal: Good sphincter tone, no polyps, or hemorrhoids felt.  Hemoccult negative. Extremities/musculoskeletal:  No swelling or varicosities noted, no clubbing or cyanosis,brace left ankle Psych:  No mood changes, alert and cooperative,seems happy PHQ 2 score 0.  Impression:  1. Well woman exam with routine gynecological exam   2. Essential hypertension   3. History of cervical cancer   4. Elevated cholesterol    5. Screening for colorectal cancer      Plan: Check CBC,CMP,TSH and lipids Continue Microzide and Zocor Physical in 1 year Mammogram yearly Colonoscopy per GI

## 2017-12-18 LAB — COMPREHENSIVE METABOLIC PANEL
A/G RATIO: 1.5 (ref 1.2–2.2)
ALBUMIN: 4.5 g/dL (ref 3.6–4.8)
ALK PHOS: 83 IU/L (ref 39–117)
ALT: 23 IU/L (ref 0–32)
AST: 23 IU/L (ref 0–40)
BUN / CREAT RATIO: 13 (ref 12–28)
BUN: 13 mg/dL (ref 8–27)
Bilirubin Total: 0.3 mg/dL (ref 0.0–1.2)
CO2: 24 mmol/L (ref 20–29)
Calcium: 9.6 mg/dL (ref 8.7–10.3)
Chloride: 101 mmol/L (ref 96–106)
Creatinine, Ser: 0.98 mg/dL (ref 0.57–1.00)
GFR calc Af Amer: 71 mL/min/{1.73_m2} (ref >59)
GFR, EST NON AFRICAN AMERICAN: 62 mL/min/{1.73_m2} (ref >59)
Globulin, Total: 3 g/dL (ref 1.5–4.5)
Glucose: 95 mg/dL (ref 65–99)
POTASSIUM: 4.1 mmol/L (ref 3.5–5.2)
SODIUM: 141 mmol/L (ref 134–144)
Total Protein: 7.5 g/dL (ref 6.0–8.5)

## 2017-12-18 LAB — CBC
HEMATOCRIT: 44.2 % (ref 34.0–46.6)
Hemoglobin: 14.4 g/dL (ref 11.1–15.9)
MCH: 29.4 pg (ref 26.6–33.0)
MCHC: 32.6 g/dL (ref 31.5–35.7)
MCV: 90 fL (ref 79–97)
Platelets: 271 10*3/uL (ref 150–379)
RBC: 4.9 x10E6/uL (ref 3.77–5.28)
RDW: 13.8 % (ref 12.3–15.4)
WBC: 8 10*3/uL (ref 3.4–10.8)

## 2017-12-18 LAB — LIPID PANEL
CHOLESTEROL TOTAL: 187 mg/dL (ref 100–199)
Chol/HDL Ratio: 2.6 ratio (ref 0.0–4.4)
HDL: 71 mg/dL (ref >39)
LDL CALC: 103 mg/dL — AB (ref 0–99)
Triglycerides: 66 mg/dL (ref 0–149)
VLDL Cholesterol Cal: 13 mg/dL (ref 5–40)

## 2017-12-18 LAB — TSH: TSH: 3.8 u[IU]/mL (ref 0.450–4.500)

## 2018-01-24 ENCOUNTER — Encounter (HOSPITAL_COMMUNITY): Payer: Self-pay

## 2018-01-24 NOTE — Therapy (Signed)
[place] [address] [place], [place], [address] Phone: [phone]   Fax:  [phone]  Patient Details  Name: Jacqueline Leon MRN: [id number] Date of Birth: 03-30-1956 Referring Provider:  No ref. provider found  Encounter Date: 01/24/2018   PHYSICAL THERAPY DISCHARGE SUMMARY  Visits from Start of Care: 7  Current functional level related to goals / functional outcomes: See last note   Remaining deficits: See last note   Education / Equipment: See last note  Plan: Patient agrees to discharge.  Patient goals were met. Patient is being discharged due to not returning since the last visit.  ?????       Geraldine Solar PT, [place] [address] [place], [place], [address] Phone: [phone]   Fax:  [phone]

## 2018-02-26 ENCOUNTER — Other Ambulatory Visit: Payer: Self-pay | Admitting: Adult Health

## 2018-06-06 DIAGNOSIS — Z6830 Body mass index (BMI) 30.0-30.9, adult: Secondary | ICD-10-CM | POA: Diagnosis not present

## 2018-06-06 DIAGNOSIS — E6609 Other obesity due to excess calories: Secondary | ICD-10-CM | POA: Diagnosis not present

## 2018-06-06 DIAGNOSIS — J019 Acute sinusitis, unspecified: Secondary | ICD-10-CM | POA: Diagnosis not present

## 2018-09-28 ENCOUNTER — Other Ambulatory Visit: Payer: Self-pay | Admitting: Adult Health

## 2018-09-28 MED ORDER — SIMVASTATIN 20 MG PO TABS: ORAL_TABLET | ORAL | 6 refills | Status: DC

## 2018-09-28 NOTE — Progress Notes (Signed)
Refilled zocor 

## 2018-10-17 ENCOUNTER — Other Ambulatory Visit: Payer: Self-pay | Admitting: Adult Health

## 2019-01-25 ENCOUNTER — Other Ambulatory Visit (HOSPITAL_COMMUNITY): Payer: Self-pay | Admitting: Adult Health

## 2019-01-25 DIAGNOSIS — Z1231 Encounter for screening mammogram for malignant neoplasm of breast: Secondary | ICD-10-CM

## 2019-06-02 ENCOUNTER — Other Ambulatory Visit: Payer: Self-pay | Admitting: Adult Health

## 2019-07-31 ENCOUNTER — Other Ambulatory Visit: Payer: Self-pay | Admitting: Adult Health

## 2019-08-02 ENCOUNTER — Encounter: Payer: Self-pay | Admitting: Adult Health

## 2019-08-02 ENCOUNTER — Other Ambulatory Visit (HOSPITAL_COMMUNITY)
Admission: RE | Admit: 2019-08-02 | Discharge: 2019-08-02 | Disposition: A | Discharge status: Home / Self Care | Payer: 59 | Source: Ambulatory Visit | Attending: Adult Health | Admitting: Adult Health

## 2019-08-02 ENCOUNTER — Ambulatory Visit (INDEPENDENT_AMBULATORY_CARE_PROVIDER_SITE_OTHER): Payer: 59 | Admitting: Adult Health

## 2019-08-02 ENCOUNTER — Ambulatory Visit (HOSPITAL_COMMUNITY)
Admission: RE | Admit: 2019-08-02 | Discharge: 2019-08-02 | Disposition: A | Discharge status: Home / Self Care | Payer: 59 | Source: Ambulatory Visit | Attending: Adult Health | Admitting: Adult Health

## 2019-08-02 ENCOUNTER — Other Ambulatory Visit: Payer: Self-pay

## 2019-08-02 VITALS — BP 150/85 | HR 70 | Ht 64.0 in | Wt 193.0 lb

## 2019-08-02 DIAGNOSIS — Z1231 Encounter for screening mammogram for malignant neoplasm of breast: Secondary | ICD-10-CM | POA: Insufficient documentation

## 2019-08-02 DIAGNOSIS — Z1272 Encounter for screening for malignant neoplasm of vagina: Secondary | ICD-10-CM

## 2019-08-02 DIAGNOSIS — Z01419 Encounter for gynecological examination (general) (routine) without abnormal findings: Secondary | ICD-10-CM | POA: Insufficient documentation

## 2019-08-02 DIAGNOSIS — Z1211 Encounter for screening for malignant neoplasm of colon: Secondary | ICD-10-CM

## 2019-08-02 DIAGNOSIS — Z1212 Encounter for screening for malignant neoplasm of rectum: Secondary | ICD-10-CM | POA: Diagnosis not present

## 2019-08-02 DIAGNOSIS — Z8541 Personal history of malignant neoplasm of cervix uteri: Secondary | ICD-10-CM

## 2019-08-02 DIAGNOSIS — I10 Essential (primary) hypertension: Secondary | ICD-10-CM

## 2019-08-02 DIAGNOSIS — E7800 Pure hypercholesterolemia, unspecified: Secondary | ICD-10-CM

## 2019-08-02 LAB — HEMOCCULT GUIAC POC 1CARD (OFFICE): Fecal Occult Blood, POC: NEGATIVE

## 2019-08-02 IMAGING — MG DIGITAL SCREENING BILAT W/ TOMO W/ CAD
8 series · 8 of 24 positions shown · non-contrast
Comparison: Previous exam(s).

CLINICAL DATA: Screening.

EXAM:
DIGITAL SCREENING BILATERAL MAMMOGRAM WITH TOMO AND CAD

[L CC synth-2D]
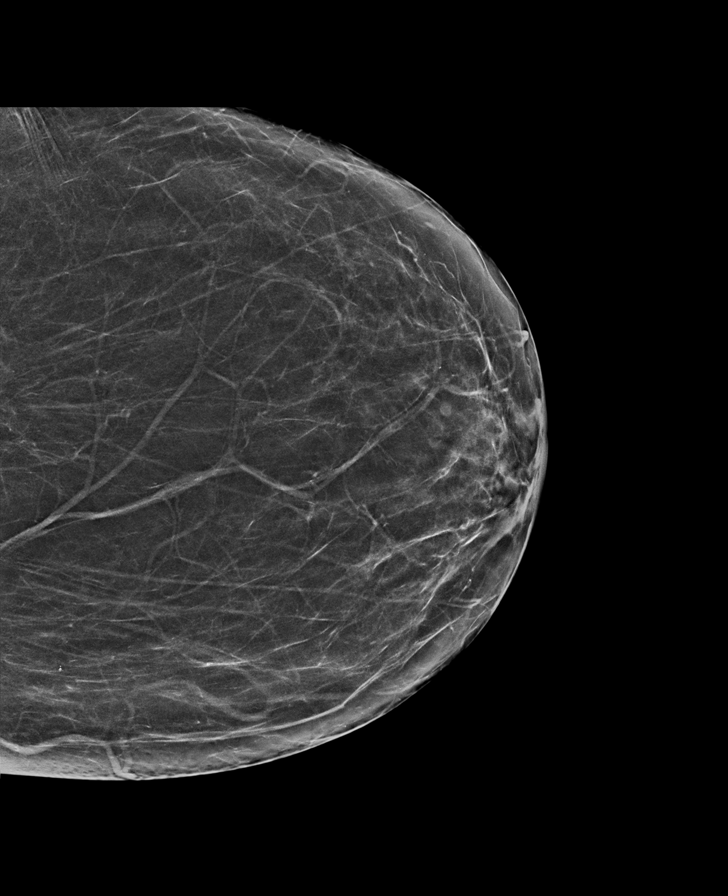

[R CC synth-2D]
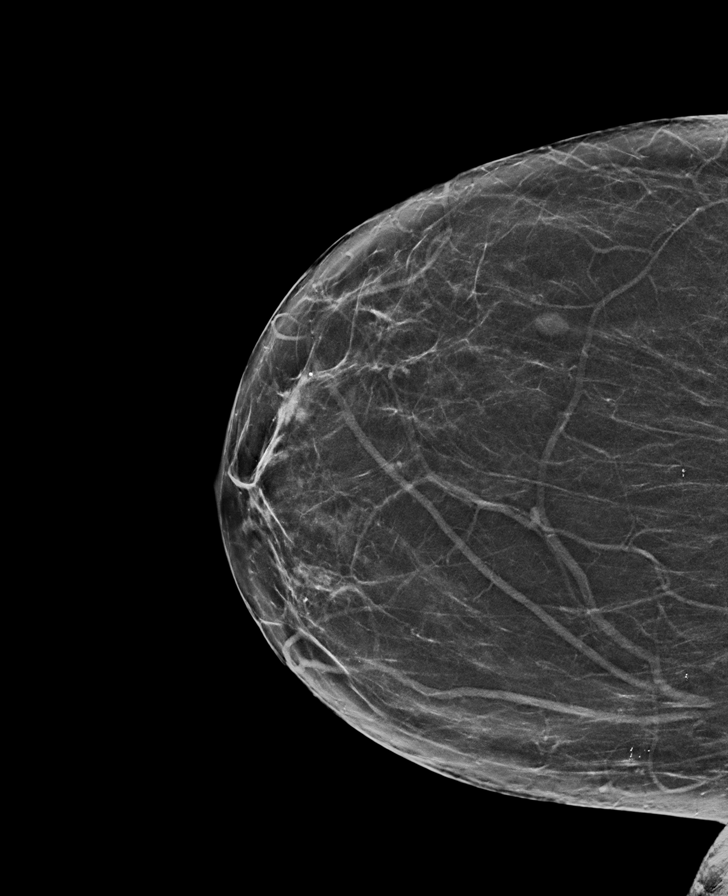

[L MLO synth-2D]
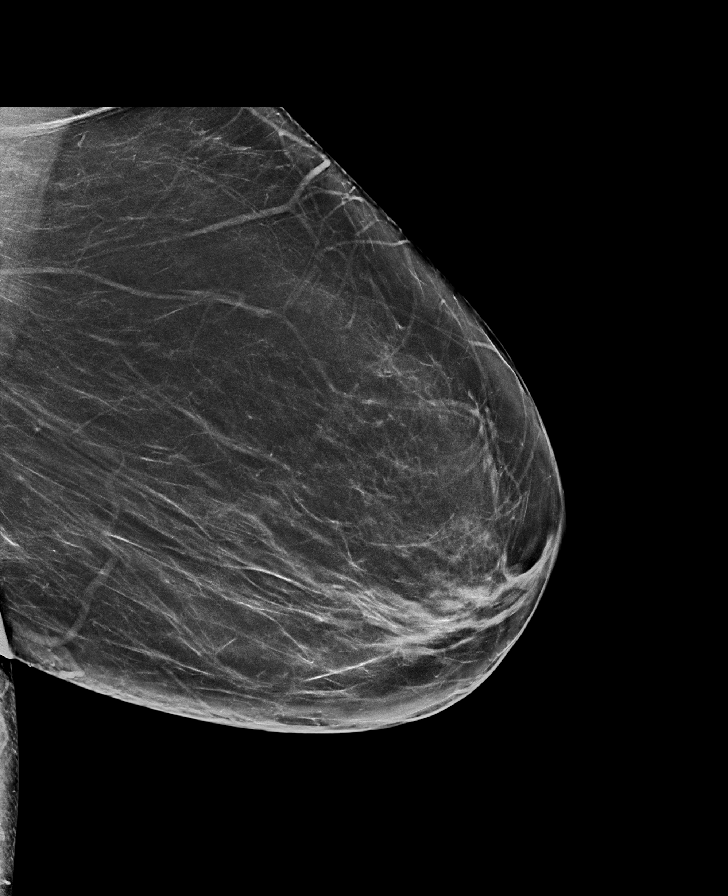

[R MLO synth-2D]
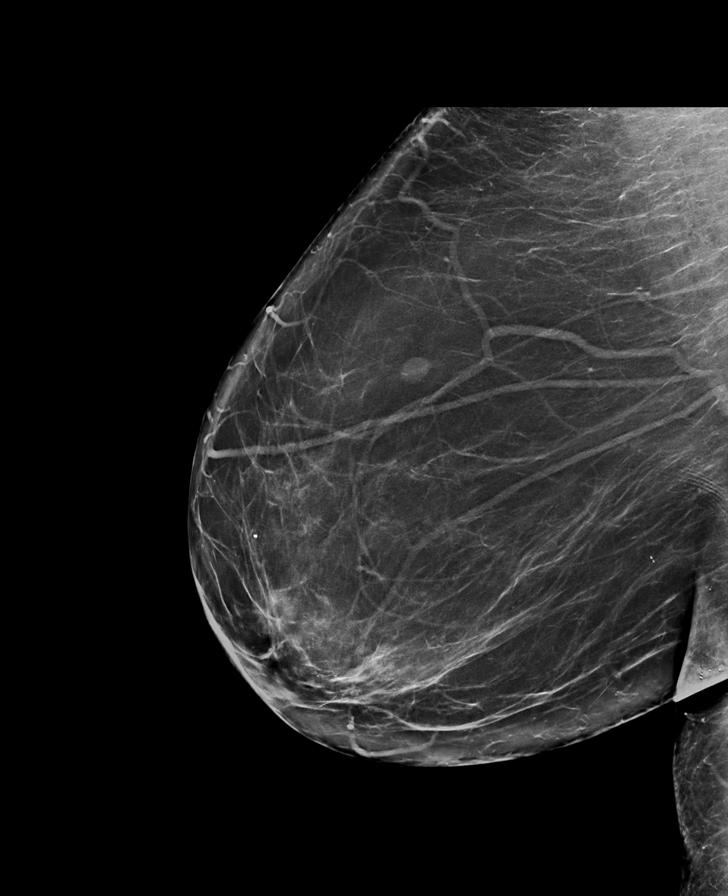

[L MLO tomo · tomo slice 38/75.0]
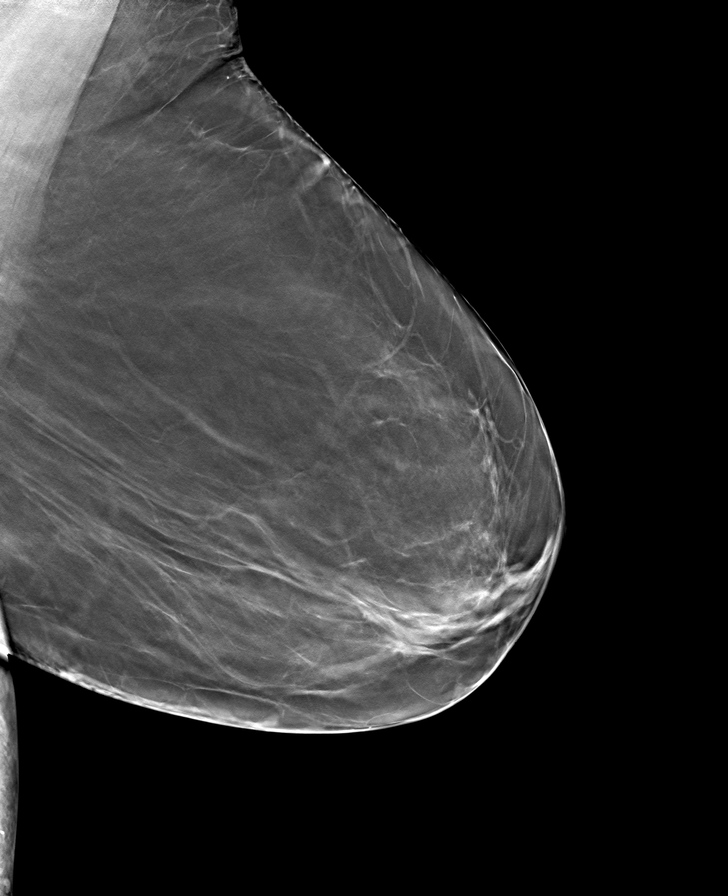

[R CC tomo · tomo slice 33/64.0]
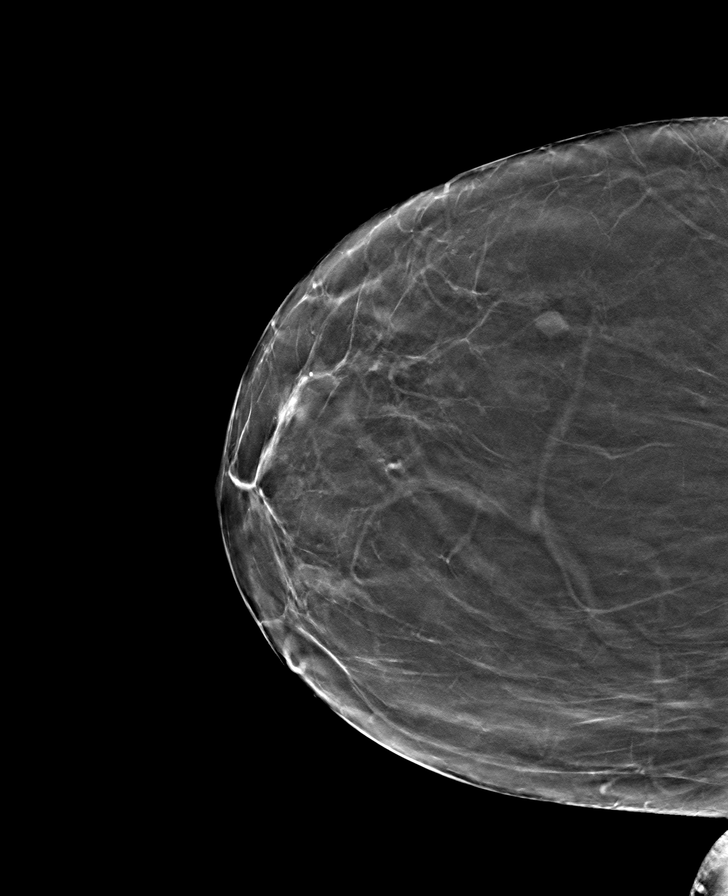

[R MLO tomo · tomo slice 41/80.0]
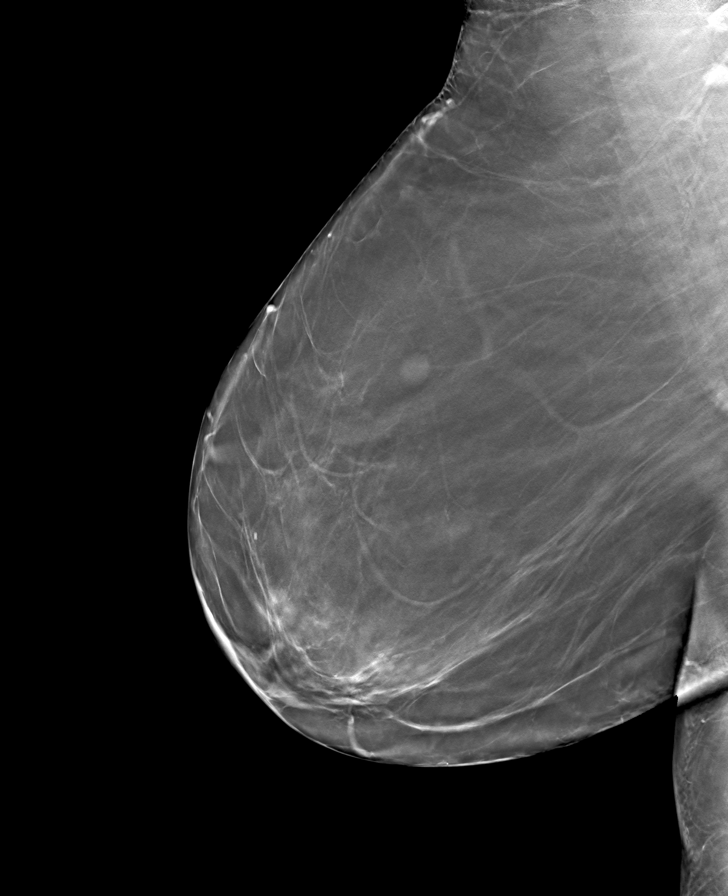

[L CC tomo · tomo slice 33/66.0]
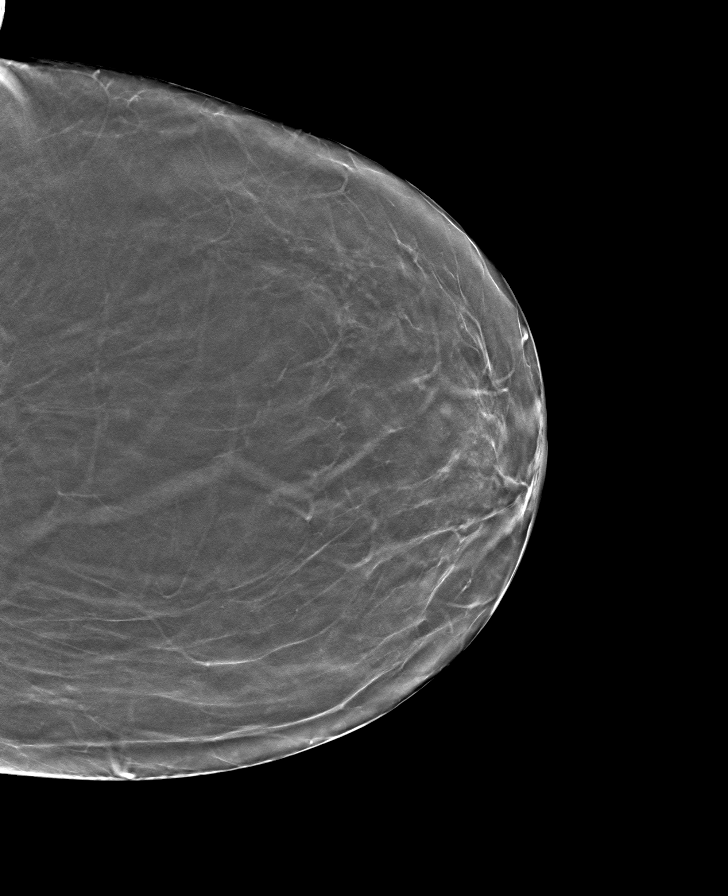

[8 of 24 positions shown; findings below may reference images not displayed]

ACR Breast Density Category b: There are scattered areas of
fibroglandular density.
FINDINGS: There are no findings suspicious for malignancy. Images were
processed with CAD.
IMPRESSION: No mammographic evidence of malignancy. A result letter of this
screening mammogram will be mailed directly to the patient.

RECOMMENDATION:
Screening mammogram in one year. (Code:[TQ])

BI-RADS CATEGORY  1: Negative.

## 2019-08-02 MED ORDER — SIMVASTATIN 20 MG PO TABS: ORAL_TABLET | ORAL | 4 refills | Status: DC

## 2019-08-02 MED ORDER — HYDROCHLOROTHIAZIDE 12.5 MG PO CAPS: 12.5000 mg | ORAL_CAPSULE | Freq: Every day | ORAL | 4 refills | Status: AC

## 2019-08-02 NOTE — Progress Notes (Signed)
Patient ID: Jacqueline Leon, female   DOB: Dec 27, 1955, 63 y.o.   MRN: [id number] History of Present Illness:  Jacqueline Leon is a 63 year old black female, married sp hysterectomy for cervical cancer, in for a well woman gyn exam and pap. She has been of BP meds for 3-4 days now, ran out. She had mammogram today.  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  PCP is Dr Hilma Favors.  Current Medications, Allergies, Past Medical History, Past Surgical History, Family History and Social History were reviewed in [employer] record.     Review of Systems: Patient denies any headaches, hearing loss, fatigue, blurred vision, shortness of breath, chest pain, abdominal pain, problems with bowel movements, urination, or intercourse. No joint pain or mood swings.    Physical Exam:BP (!) 150/85 (BP Location: Right Arm, Patient Position: Sitting, Cuff Size: Normal)   Pulse 70   Ht 5\' 4"  (1.626 m)   Wt 193 lb (87.5 kg)   BMI 33.13 kg/m  General:  Well developed, well nourished, no acute distress Skin:  Warm and dry Neck:  Midline trachea, normal thyroid, good ROM, no lymphadenopathy,no carotid bruits heard Lungs; Clear to auscultation bilaterally Breast:  No dominant palpable mass, retraction, or nipple discharge Cardiovascular: Regular rate and rhythm Abdomen:  Soft, non tender, no hepatosplenomegaly Pelvic:  External genitalia is normal in appearance, no lesions.  The vagina is normal in appearance, no lesions, cuff looks good, vaginal pap obtained with high risk HPV 16/18 genotyping performed.  Urethra has no lesions or masses. The cervix and uterus are absent.Bladder is non tender, no masses felt. Rectal: Good sphincter tone, no polyps, or hemorrhoids felt.  Hemoccult negative. Extremities/musculoskeletal:  No swelling or varicosities noted, no clubbing or cyanosis Psych:  No mood changes, alert and cooperative,seems happy Fall risk is low PHQ 2 score 0. Examination chaperoned by Rolena Infante LPN Told pt her mammogram was negative for malignancy, had fibroglandular density, so repeat in 1 year.  Impression and Plan: 1. Smear, vaginal, as part of routine gynecological examination Pap sent  Physical in 1 year Mammogram yearly Check CBC,CMP,TSH and lipids  2. Screening for colorectal cancer Colonoscopy due  3. Essential hypertension Refilled Microzide Meds ordered this encounter  Medications  . hydrochlorothiazide (MICROZIDE) 12.5 MG capsule    Sig: Take 1 capsule (12.5 mg total) by mouth daily.    Dispense:  90 capsule    Refill:  4    Order Specific Question:   Supervising Provider    Answer:   Elonda Husky, LUTHER H [[id number]]  . simvastatin (ZOCOR) 20 MG tablet    Sig: Take 1 daily    Dispense:  90 tablet    Refill:  4    Order Specific Question:   Supervising Provider    Answer:   Elonda Husky, LUTHER H [[id number]]    4. Elevated cholesterol Refilled zocor  5. History of cervical cancer

## 2019-08-03 LAB — LIPID PANEL
Chol/HDL Ratio: 2.7 ratio (ref 0.0–4.4)
Cholesterol, Total: 166 mg/dL (ref 100–199)
HDL: 61 mg/dL (ref >39)
LDL Chol Calc (NIH): 94 mg/dL (ref 0–99)
Triglycerides: 56 mg/dL (ref 0–149)
VLDL Cholesterol Cal: 11 mg/dL (ref 5–40)

## 2019-08-03 LAB — CBC
Hematocrit: 41.8 % (ref 34.0–46.6)
Hemoglobin: 13.9 g/dL (ref 11.1–15.9)
MCH: 30.3 pg (ref 26.6–33.0)
MCHC: 33.3 g/dL (ref 31.5–35.7)
MCV: 91 fL (ref 79–97)
Platelets: 243 10*3/uL (ref 150–450)
RBC: 4.58 x10E6/uL (ref 3.77–5.28)
RDW: 12.8 % (ref 11.7–15.4)
WBC: 7.1 10*3/uL (ref 3.4–10.8)

## 2019-08-03 LAB — COMPREHENSIVE METABOLIC PANEL
ALT: 20 IU/L (ref 0–32)
AST: 24 IU/L (ref 0–40)
Albumin/Globulin Ratio: 1.5 (ref 1.2–2.2)
Albumin: 4 g/dL (ref 3.8–4.8)
Alkaline Phosphatase: 67 IU/L (ref 39–117)
BUN/Creatinine Ratio: 18 (ref 12–28)
BUN: 17 mg/dL (ref 8–27)
Bilirubin Total: 0.3 mg/dL (ref 0.0–1.2)
CO2: 24 mmol/L (ref 20–29)
Calcium: 9.8 mg/dL (ref 8.7–10.3)
Chloride: 105 mmol/L (ref 96–106)
Creatinine, Ser: 0.95 mg/dL (ref 0.57–1.00)
GFR calc Af Amer: 73 mL/min/{1.73_m2} (ref >59)
GFR calc non Af Amer: 63 mL/min/{1.73_m2} (ref >59)
Globulin, Total: 2.6 g/dL (ref 1.5–4.5)
Glucose: 88 mg/dL (ref 65–99)
Potassium: 4.8 mmol/L (ref 3.5–5.2)
Sodium: 144 mmol/L (ref 134–144)
Total Protein: 6.6 g/dL (ref 6.0–8.5)

## 2019-08-03 LAB — TSH: TSH: 4.5 u[IU]/mL (ref 0.450–4.500)

## 2019-08-08 LAB — CYTOLOGY - PAP
Comment: NEGATIVE
Diagnosis: NEGATIVE
High risk HPV: NEGATIVE

## 2019-11-29 DIAGNOSIS — E7849 Other hyperlipidemia: Secondary | ICD-10-CM | POA: Diagnosis not present

## 2019-11-29 DIAGNOSIS — R946 Abnormal results of thyroid function studies: Secondary | ICD-10-CM | POA: Diagnosis not present

## 2019-11-29 DIAGNOSIS — Z0001 Encounter for general adult medical examination with abnormal findings: Secondary | ICD-10-CM | POA: Diagnosis not present

## 2019-11-29 DIAGNOSIS — I10 Essential (primary) hypertension: Secondary | ICD-10-CM | POA: Diagnosis not present

## 2020-08-01 DIAGNOSIS — Z681 Body mass index (BMI) 19 or less, adult: Secondary | ICD-10-CM | POA: Diagnosis not present

## 2020-08-01 DIAGNOSIS — J019 Acute sinusitis, unspecified: Secondary | ICD-10-CM | POA: Diagnosis not present

## 2020-09-05 ENCOUNTER — Other Ambulatory Visit (HOSPITAL_COMMUNITY): Payer: Self-pay | Admitting: Family Medicine

## 2020-09-05 DIAGNOSIS — Z1231 Encounter for screening mammogram for malignant neoplasm of breast: Secondary | ICD-10-CM

## 2020-10-21 ENCOUNTER — Other Ambulatory Visit: Payer: Self-pay

## 2020-10-21 ENCOUNTER — Ambulatory Visit (HOSPITAL_COMMUNITY)
Admission: RE | Admit: 2020-10-21 | Discharge: 2020-10-21 | Disposition: A | Discharge status: Home / Self Care | Payer: Medicare Other | Source: Ambulatory Visit | Attending: Family Medicine | Admitting: Family Medicine

## 2020-10-21 ENCOUNTER — Encounter: Payer: Self-pay | Admitting: Adult Health

## 2020-10-21 ENCOUNTER — Ambulatory Visit (INDEPENDENT_AMBULATORY_CARE_PROVIDER_SITE_OTHER): Payer: Medicare Other | Admitting: Adult Health

## 2020-10-21 VITALS — BP 132/82 | HR 83 | Ht 64.0 in | Wt 191.0 lb

## 2020-10-21 DIAGNOSIS — Z1231 Encounter for screening mammogram for malignant neoplasm of breast: Secondary | ICD-10-CM | POA: Insufficient documentation

## 2020-10-21 DIAGNOSIS — Z1211 Encounter for screening for malignant neoplasm of colon: Secondary | ICD-10-CM | POA: Diagnosis not present

## 2020-10-21 DIAGNOSIS — Z01419 Encounter for gynecological examination (general) (routine) without abnormal findings: Secondary | ICD-10-CM | POA: Diagnosis not present

## 2020-10-21 DIAGNOSIS — E7800 Pure hypercholesterolemia, unspecified: Secondary | ICD-10-CM | POA: Diagnosis not present

## 2020-10-21 DIAGNOSIS — I10 Essential (primary) hypertension: Secondary | ICD-10-CM | POA: Diagnosis not present

## 2020-10-21 LAB — HEMOCCULT GUIAC POC 1CARD (OFFICE): Fecal Occult Blood, POC: NEGATIVE

## 2020-10-21 IMAGING — MG MM DIGITAL SCREENING BILAT W/ TOMO AND CAD
6 of 10 series · 6 of 30 positions shown · non-contrast
Comparison: Previous exam(s).

CLINICAL DATA: Screening.

EXAM:
DIGITAL SCREENING BILATERAL MAMMOGRAM WITH TOMOSYNTHESIS AND CAD
TECHNIQUE: Bilateral screening digital craniocaudal and mediolateral oblique
mammograms were obtained. Bilateral screening digital breast
tomosynthesis was performed. The images were evaluated with
computer-aided detection.

[L CC synth-2D]
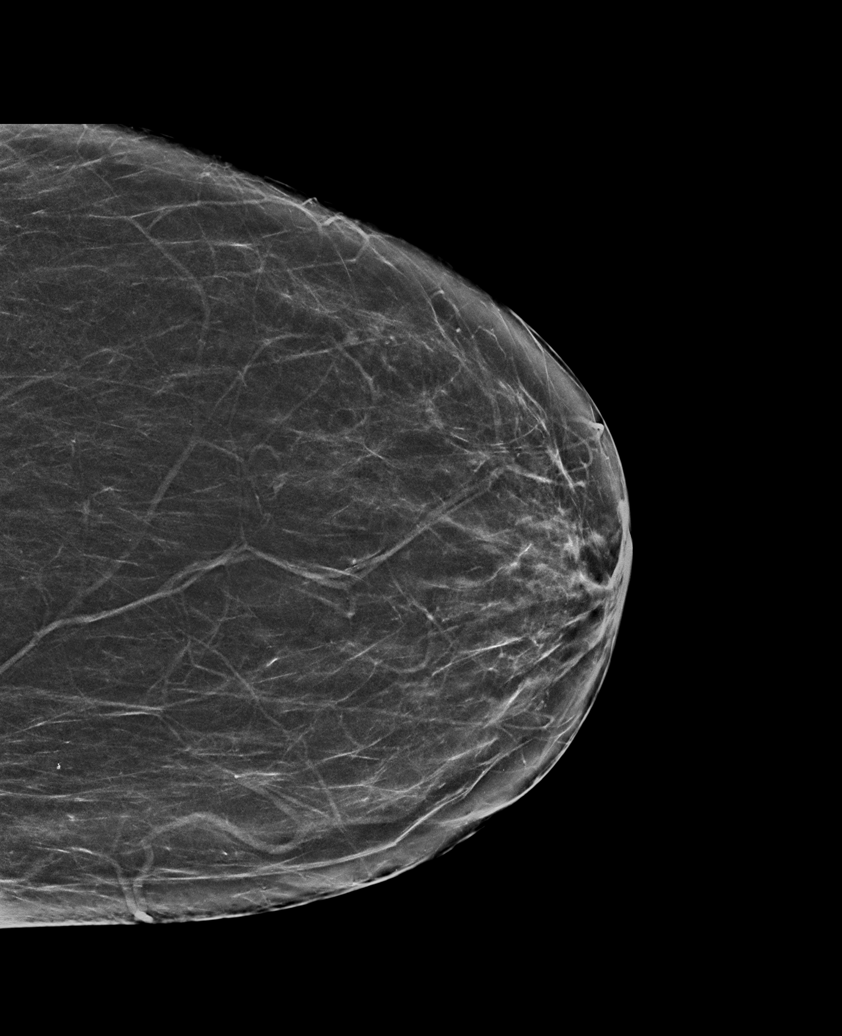

[R MLO synth-2D (1 of 2)]
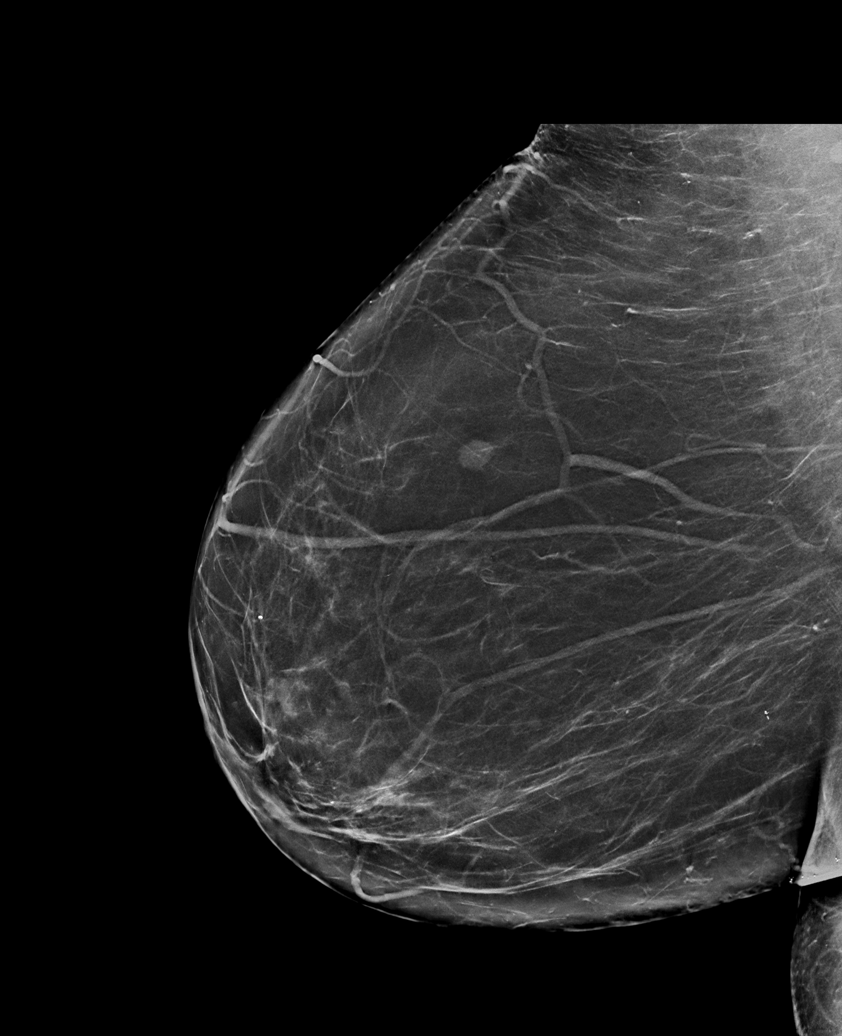

[L MLO synth-2D]
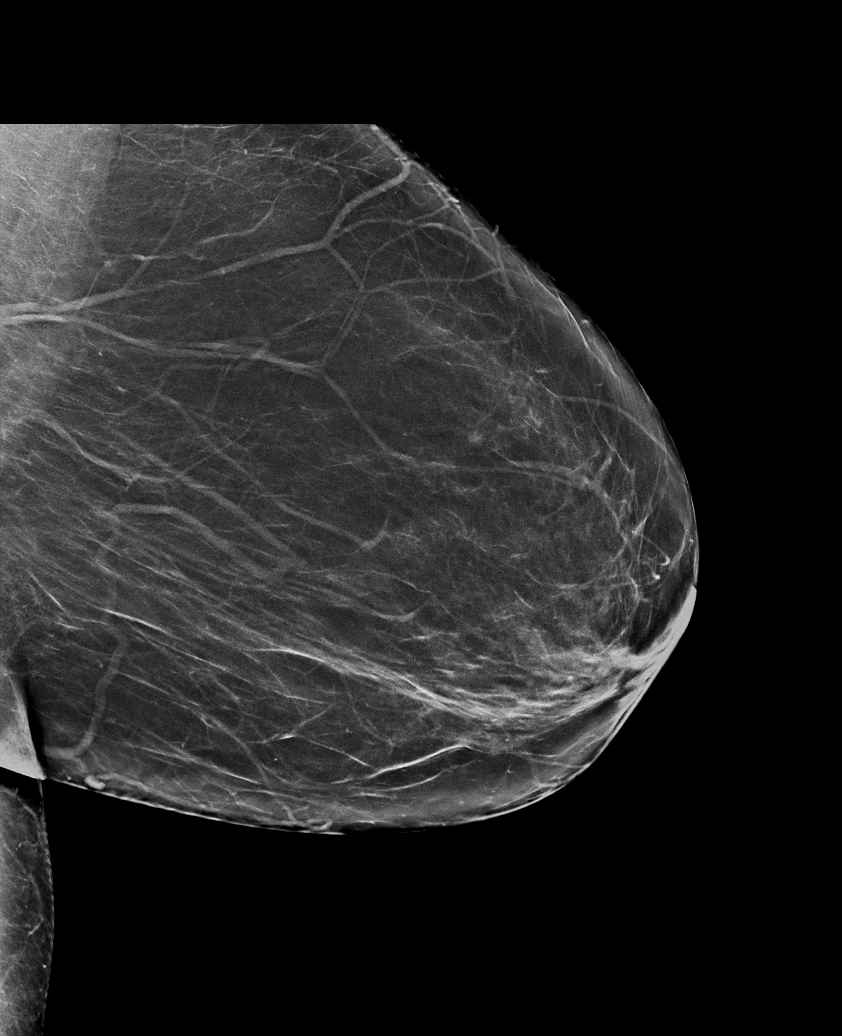

[R MLO synth-2D (2 of 2)]
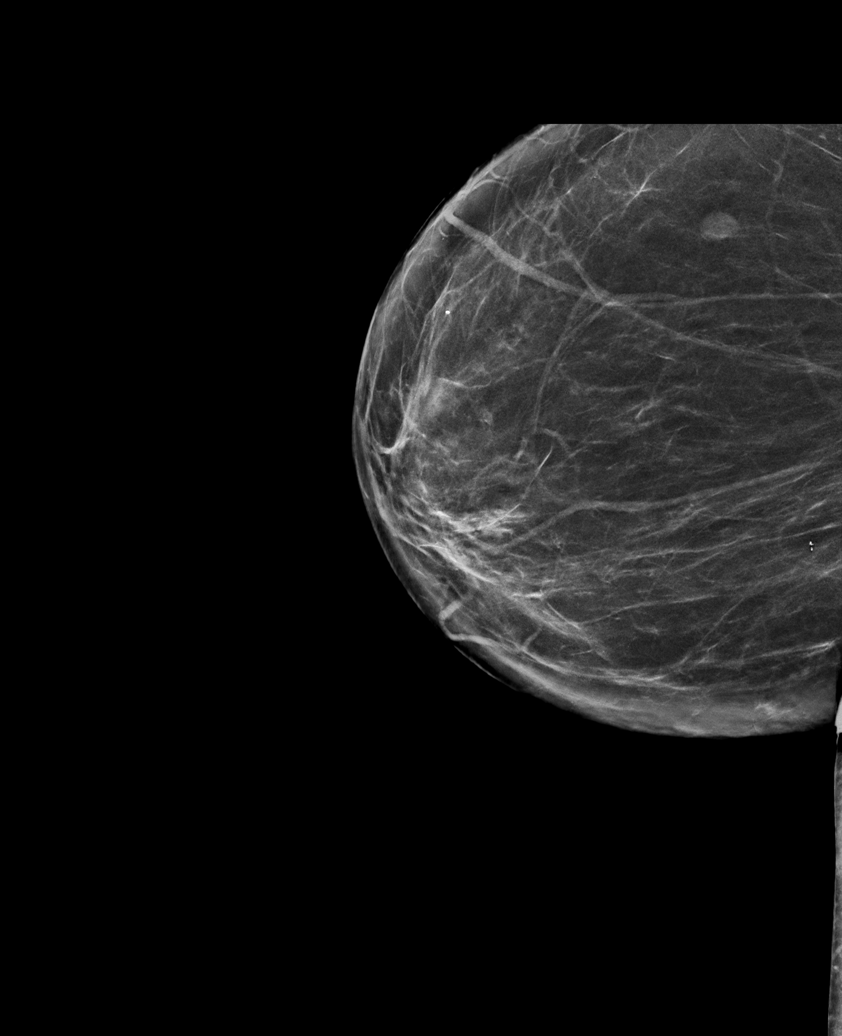

[R CC synth-2D]
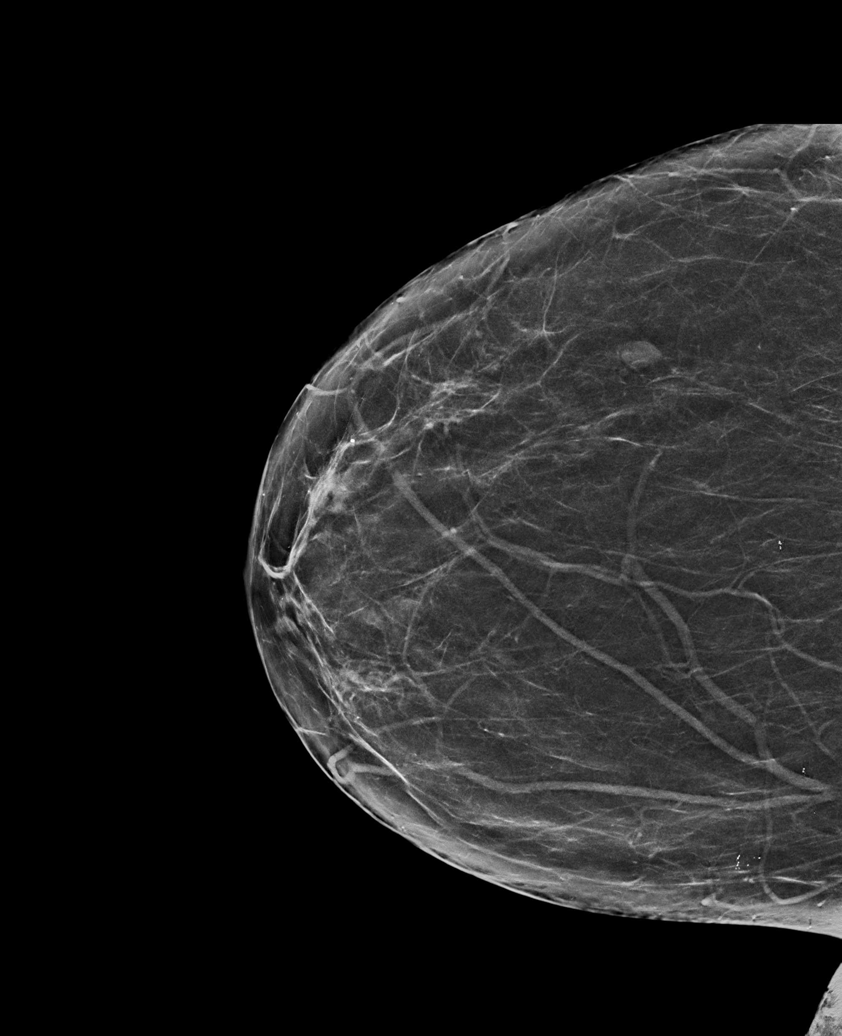

[R MLO tomo · tomo slice 39/76.0]
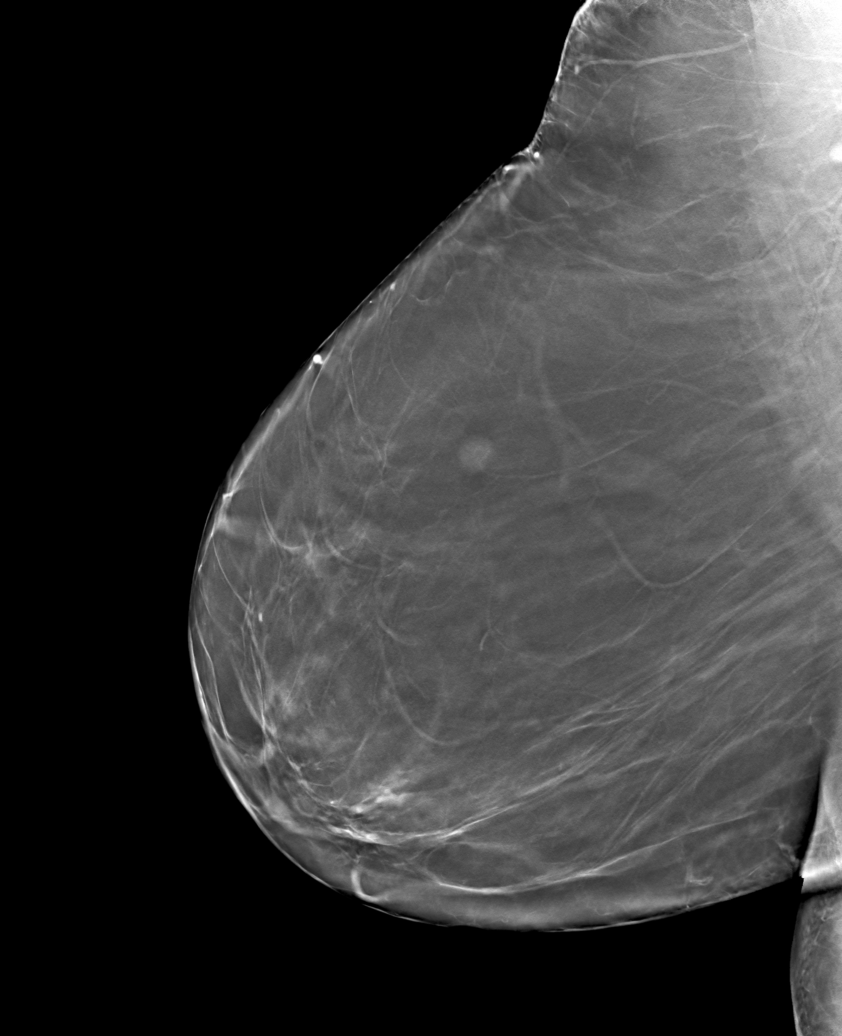

[6 of 30 positions shown; findings below may reference images not displayed]

ACR Breast Density Category b: There are scattered areas of
fibroglandular density.
FINDINGS: There are no findings suspicious for malignancy. The images were
evaluated with computer-aided detection.
IMPRESSION: No mammographic evidence of malignancy. A result letter of this
screening mammogram will be mailed directly to the patient.

RECOMMENDATION:
Screening mammogram in one year. (Code:[OD])

BI-RADS CATEGORY  1: Negative.

## 2020-10-21 NOTE — Progress Notes (Signed)
Patient ID: Jacqueline Leon, female   DOB: 07/17/1956, 65 y.o.   MRN: [id number] History of Present Illness: Jacqueline Leon is a 65 year old black female,married, sp hysterectomy in for well woman gyn exam. She had vaginal pap that was normal and negative for HPV, 08/02/2019. PCP is Dr Phillips Odor.   Current Medications, Allergies, Past Medical History, Past Surgical History, Family History and Social History were reviewed in [name] record.     Review of Systems: Patient denies any headaches, hearing loss, fatigue, blurred vision, shortness of breath, chest pain, abdominal pain, problems with bowel movements, urination, or intercourse. No joint pain or mood swings.    Physical Exam:BP 132/82 (BP Location: Left Arm, Patient Position: Sitting, Cuff Size: Normal)   Pulse 83   Ht 5\' 4"  (1.626 m)   Wt 191 lb (86.6 kg)   BMI 32.79 kg/m  General:  Well developed, well nourished, no acute distress Skin:  Warm and dry Neck:  Midline trachea, normal thyroid, good ROM, no lymphadenopathy,no carotid bruits heard  Lungs; Clear to auscultation bilaterally Breast:  No dominant palpable mass, retraction, or nipple discharge Cardiovascular: Regular rate and rhythm Abdomen:  Soft, non tender, no hepatosplenomegaly Pelvic:  External genitalia is normal in appearance, no lesions.  The vagina is normal in appearance, good vaginal cuff, no lesions. Urethra has no lesions or masses. The cervix and uterus are absent. No adnexal masses or tenderness noted.Bladder is non tender, no masses felt. Rectal: Good sphincter tone, no polyps, or hemorrhoids felt.  Hemoccult negative. Extremities/musculoskeletal:  No swelling or varicosities noted, no clubbing or cyanosis Psych:  No mood changes, alert and cooperative,seems happy AA is 2 Fall risk is low PHQ 9 score is 0 GAD 7 score is 0  [facility] - 10/21/20 1403      Pregnancy Intention Screening   Does the patient want to become pregnant in the  next year? N/A    Does the patient's partner want to become pregnant in the next year? N/A    Would the patient like to discuss contraceptive options today? N/A      Contraception Wrap Up   Current Method No Method - Other Reason   hysterectomy   End Method No Method - Other Reason   hysterectomy   Contraception Counseling Provided No         Examination chaperoned by Tish RN  Impression and Plan: 1. Encounter for well woman exam with routine gynecological exam Physical in 1 year Mammogram today and yearly - CBC - Comprehensive metabolic panel - TSH Colonoscopy advised, awaiting on records to go to GI in [place]   2. Encounter for screening fecal occult blood testing - POCT occult blood stool  3. Essential hypertension Continue Microzide as refills - Comprehensive metabolic panel Follow up with PCP   4. Elevated cholesterol Continue Zocor has refills  - Comprehensive metabolic panel - Lipid panel

## 2020-10-22 LAB — COMPREHENSIVE METABOLIC PANEL
ALT: 26 IU/L (ref 0–32)
AST: 30 IU/L (ref 0–40)
Albumin/Globulin Ratio: 1.7 (ref 1.2–2.2)
Albumin: 4.5 g/dL (ref 3.8–4.8)
Alkaline Phosphatase: 75 IU/L (ref 44–121)
BUN/Creatinine Ratio: 22 (ref 12–28)
BUN: 22 mg/dL (ref 8–27)
Bilirubin Total: 0.4 mg/dL (ref 0.0–1.2)
CO2: 24 mmol/L (ref 20–29)
Calcium: 9.3 mg/dL (ref 8.7–10.3)
Chloride: 102 mmol/L (ref 96–106)
Creatinine, Ser: 0.98 mg/dL (ref 0.57–1.00)
Globulin, Total: 2.7 g/dL (ref 1.5–4.5)
Glucose: 80 mg/dL (ref 65–99)
Potassium: 3.8 mmol/L (ref 3.5–5.2)
Sodium: 141 mmol/L (ref 134–144)
Total Protein: 7.2 g/dL (ref 6.0–8.5)
eGFR: 64 mL/min/{1.73_m2} (ref >59)

## 2020-10-22 LAB — LIPID PANEL
Chol/HDL Ratio: 2.7 ratio (ref 0.0–4.4)
Cholesterol, Total: 182 mg/dL (ref 100–199)
HDL: 68 mg/dL (ref >39)
LDL Chol Calc (NIH): 104 mg/dL — ABNORMAL HIGH (ref 0–99)
Triglycerides: 50 mg/dL (ref 0–149)
VLDL Cholesterol Cal: 10 mg/dL (ref 5–40)

## 2020-10-22 LAB — CBC
Hematocrit: 42.3 % (ref 34.0–46.6)
Hemoglobin: 14 g/dL (ref 11.1–15.9)
MCH: 30 pg (ref 26.6–33.0)
MCHC: 33.1 g/dL (ref 31.5–35.7)
MCV: 91 fL (ref 79–97)
Platelets: 238 10*3/uL (ref 150–450)
RBC: 4.66 x10E6/uL (ref 3.77–5.28)
RDW: 13.5 % (ref 11.7–15.4)
WBC: 7.6 10*3/uL (ref 3.4–10.8)

## 2020-10-22 LAB — TSH: TSH: 4.22 u[IU]/mL (ref 0.450–4.500)

## 2021-05-28 DIAGNOSIS — L409 Psoriasis, unspecified: Secondary | ICD-10-CM | POA: Diagnosis not present

## 2021-05-28 DIAGNOSIS — Z0000 Encounter for general adult medical examination without abnormal findings: Secondary | ICD-10-CM | POA: Diagnosis not present

## 2021-05-28 DIAGNOSIS — E7849 Other hyperlipidemia: Secondary | ICD-10-CM | POA: Diagnosis not present

## 2021-05-28 DIAGNOSIS — E782 Mixed hyperlipidemia: Secondary | ICD-10-CM | POA: Diagnosis not present

## 2021-05-28 DIAGNOSIS — I10 Essential (primary) hypertension: Secondary | ICD-10-CM | POA: Diagnosis not present

## 2021-08-25 DIAGNOSIS — Z1211 Encounter for screening for malignant neoplasm of colon: Secondary | ICD-10-CM | POA: Diagnosis not present

## 2021-08-25 DIAGNOSIS — K644 Residual hemorrhoidal skin tags: Secondary | ICD-10-CM | POA: Diagnosis not present

## 2021-08-25 DIAGNOSIS — K648 Other hemorrhoids: Secondary | ICD-10-CM | POA: Diagnosis not present

## 2022-01-01 DIAGNOSIS — G459 Transient cerebral ischemic attack, unspecified: Secondary | ICD-10-CM

## 2022-01-01 HISTORY — DX: Transient cerebral ischemic attack, unspecified: G45.9

## 2022-01-20 ENCOUNTER — Other Ambulatory Visit: Payer: Self-pay

## 2022-01-20 ENCOUNTER — Encounter (HOSPITAL_COMMUNITY): Payer: Self-pay | Admitting: Emergency Medicine

## 2022-01-20 ENCOUNTER — Emergency Department (HOSPITAL_COMMUNITY): Payer: Medicare Other

## 2022-01-20 ENCOUNTER — Observation Stay (HOSPITAL_COMMUNITY)
Admission: EM | Admit: 2022-01-20 | Discharge: 2022-01-22 | Disposition: A | Discharge status: Home / Self Care | Payer: Medicare Other | Attending: Internal Medicine | Admitting: Internal Medicine

## 2022-01-20 DIAGNOSIS — E669 Obesity, unspecified: Secondary | ICD-10-CM | POA: Diagnosis not present

## 2022-01-20 DIAGNOSIS — Z8541 Personal history of malignant neoplasm of cervix uteri: Secondary | ICD-10-CM | POA: Diagnosis not present

## 2022-01-20 DIAGNOSIS — I639 Cerebral infarction, unspecified: Secondary | ICD-10-CM | POA: Diagnosis not present

## 2022-01-20 DIAGNOSIS — G459 Transient cerebral ischemic attack, unspecified: Principal | ICD-10-CM | POA: Insufficient documentation

## 2022-01-20 DIAGNOSIS — I10 Essential (primary) hypertension: Secondary | ICD-10-CM | POA: Insufficient documentation

## 2022-01-20 DIAGNOSIS — Z20822 Contact with and (suspected) exposure to covid-19: Secondary | ICD-10-CM | POA: Insufficient documentation

## 2022-01-20 DIAGNOSIS — E782 Mixed hyperlipidemia: Secondary | ICD-10-CM | POA: Diagnosis not present

## 2022-01-20 DIAGNOSIS — Z79899 Other long term (current) drug therapy: Secondary | ICD-10-CM | POA: Diagnosis not present

## 2022-01-20 DIAGNOSIS — R9431 Abnormal electrocardiogram [ECG] [EKG]: Secondary | ICD-10-CM | POA: Diagnosis not present

## 2022-01-20 DIAGNOSIS — Z87891 Personal history of nicotine dependence: Secondary | ICD-10-CM | POA: Insufficient documentation

## 2022-01-20 DIAGNOSIS — M25511 Pain in right shoulder: Secondary | ICD-10-CM | POA: Diagnosis not present

## 2022-01-20 LAB — APTT: aPTT: 29 seconds (ref 24–36)

## 2022-01-20 LAB — CBC
HCT: 44.9 % (ref 36.0–46.0)
Hemoglobin: 14.7 g/dL (ref 12.0–15.0)
MCH: 30.4 pg (ref 26.0–34.0)
MCHC: 32.7 g/dL (ref 30.0–36.0)
MCV: 93 fL (ref 80.0–100.0)
Platelets: 232 10*3/uL (ref 150–400)
RBC: 4.83 MIL/uL (ref 3.87–5.11)
RDW: 13.4 % (ref 11.5–15.5)
WBC: 9.7 10*3/uL (ref 4.0–10.5)
nRBC: 0 % (ref 0.0–0.2)

## 2022-01-20 LAB — RESP PANEL BY RT-PCR (FLU A&B, COVID) ARPGX2
Influenza A by PCR: NEGATIVE
Influenza B by PCR: NEGATIVE
SARS Coronavirus 2 by RT PCR: NEGATIVE

## 2022-01-20 LAB — DIFFERENTIAL
Abs Immature Granulocytes: 0.03 10*3/uL (ref 0.00–0.07)
Basophils Absolute: 0 10*3/uL (ref 0.0–0.1)
Basophils Relative: 0 %
Eosinophils Absolute: 0.1 10*3/uL (ref 0.0–0.5)
Eosinophils Relative: 1 %
Immature Granulocytes: 0 %
Lymphocytes Relative: 26 %
Lymphs Abs: 2.5 10*3/uL (ref 0.7–4.0)
Monocytes Absolute: 0.6 10*3/uL (ref 0.1–1.0)
Monocytes Relative: 6 %
Neutro Abs: 6.5 10*3/uL (ref 1.7–7.7)
Neutrophils Relative %: 67 %

## 2022-01-20 LAB — PROTIME-INR
INR: 1 (ref 0.8–1.2)
Prothrombin Time: 13.1 seconds (ref 11.4–15.2)

## 2022-01-20 LAB — COMPREHENSIVE METABOLIC PANEL
ALT: 29 U/L (ref 0–44)
AST: 27 U/L (ref 15–41)
Albumin: 4.4 g/dL (ref 3.5–5.0)
Alkaline Phosphatase: 55 U/L (ref 38–126)
Anion gap: 7 (ref 5–15)
BUN: 18 mg/dL (ref 8–23)
CO2: 26 mmol/L (ref 22–32)
Calcium: 9.5 mg/dL (ref 8.9–10.3)
Chloride: 105 mmol/L (ref 98–111)
Creatinine, Ser: 0.83 mg/dL (ref 0.44–1.00)
GFR, Estimated: >60 mL/min (ref >60)
Glucose, Bld: 94 mg/dL (ref 70–99)
Potassium: 3.7 mmol/L (ref 3.5–5.1)
Sodium: 138 mmol/L (ref 135–145)
Total Bilirubin: 0.8 mg/dL (ref 0.3–1.2)
Total Protein: 8 g/dL (ref 6.5–8.1)

## 2022-01-20 LAB — LIPID PANEL
Cholesterol: 198 mg/dL (ref 0–200)
HDL: 72 mg/dL (ref >40)
LDL Cholesterol: 116 mg/dL — ABNORMAL HIGH (ref 0–99)
Total CHOL/HDL Ratio: 2.8 RATIO
Triglycerides: 49 mg/dL (ref <150)
VLDL: 10 mg/dL (ref 0–40)

## 2022-01-20 LAB — ETHANOL: Alcohol, Ethyl (B): <10 mg/dL (ref <10)

## 2022-01-20 IMAGING — CT CT HEAD W/O CM
4 series · 17 of 47 positions shown, 19 images · non-contrast
Comparison: None Available.

CLINICAL DATA: Transient ischemic attack. Right shoulder pa[REDACTED]reased range of motion unable to lift arm above head without
pain.



[Series 2: head w o · axial · 0.43mm/px · z∈[+1358,+1468]mm · 7 of 30 slices shown, 9 images]
[im 4/30  brain]
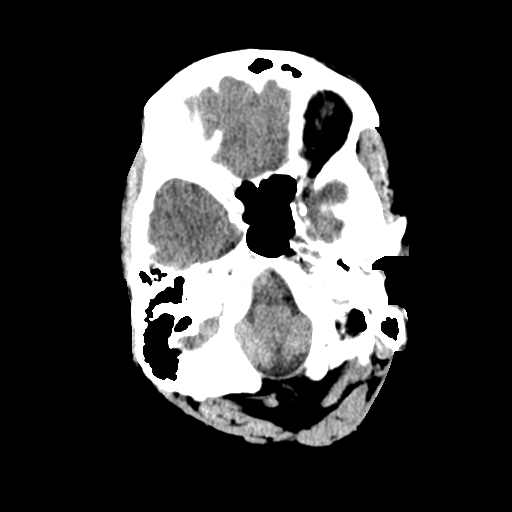
[im 4/30  bone]
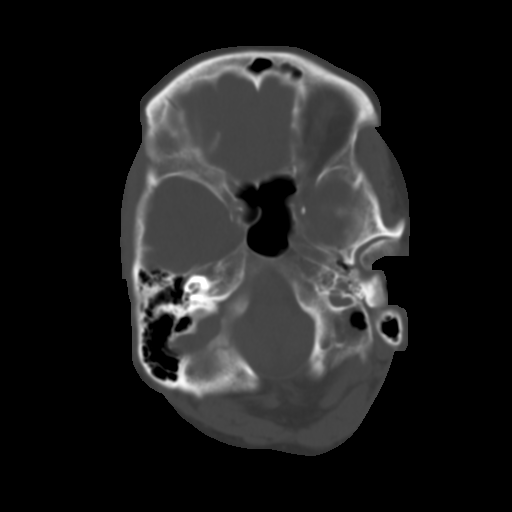
[im 8/30  brain]
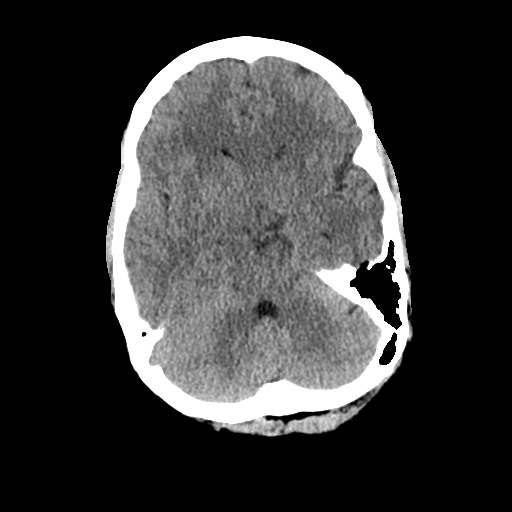
[im 11/30  brain]
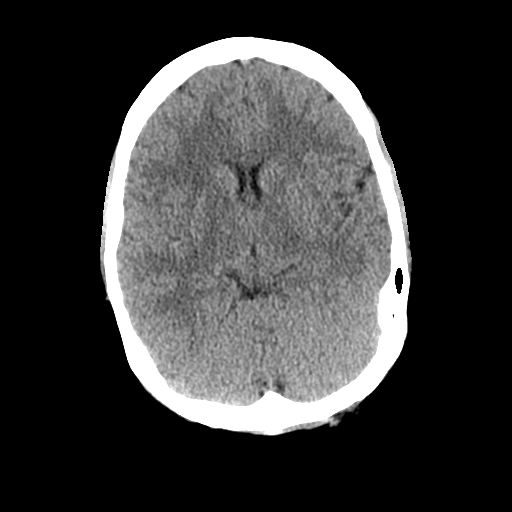
[im 15/30  brain]
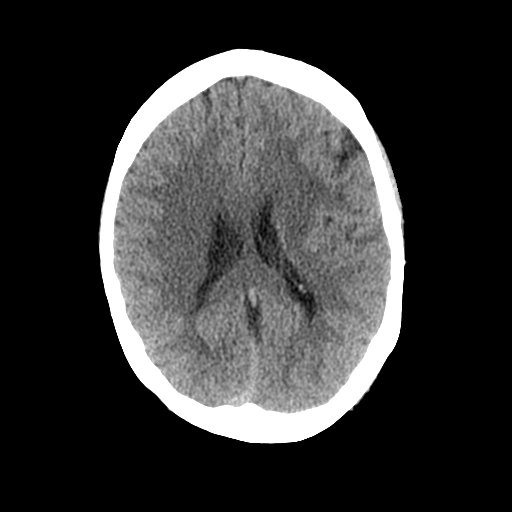
[im 19/30  brain]
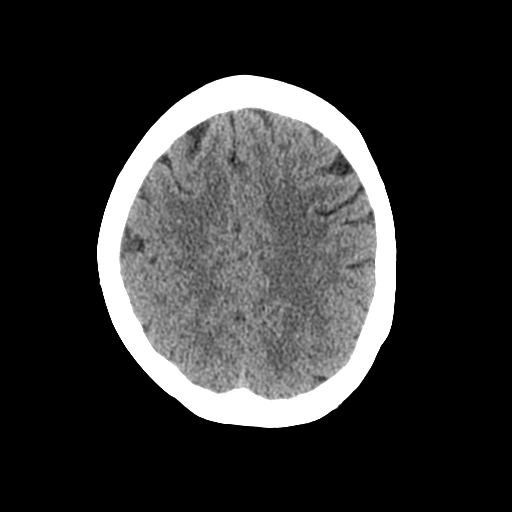
[im 19/30  bone]
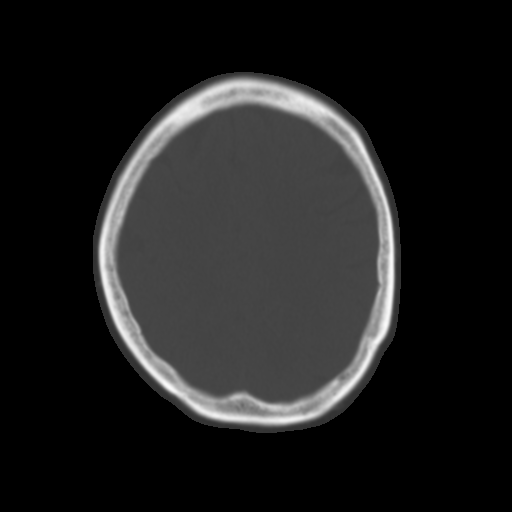
[im 22/30  brain]
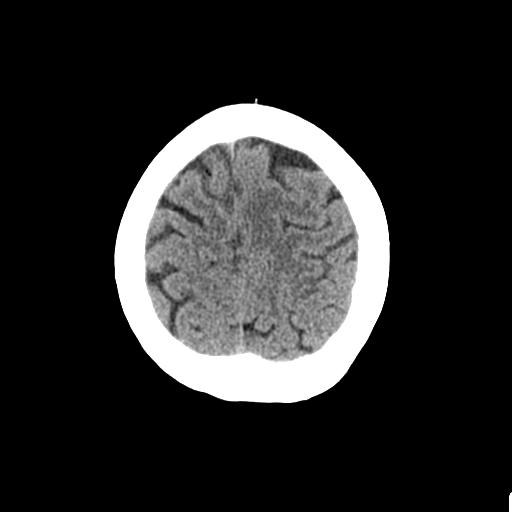
[im 26/30  brain]
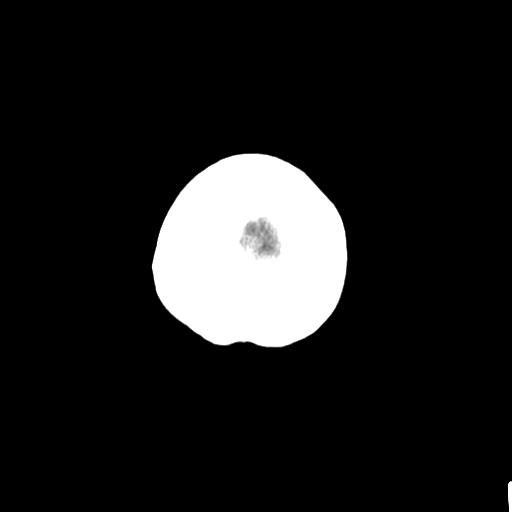

[Series 3: head bone · axial · 0.43mm/px · z∈[+1357,+1407]mm · 4 of 74 slices shown]
[im 8/74  bone]
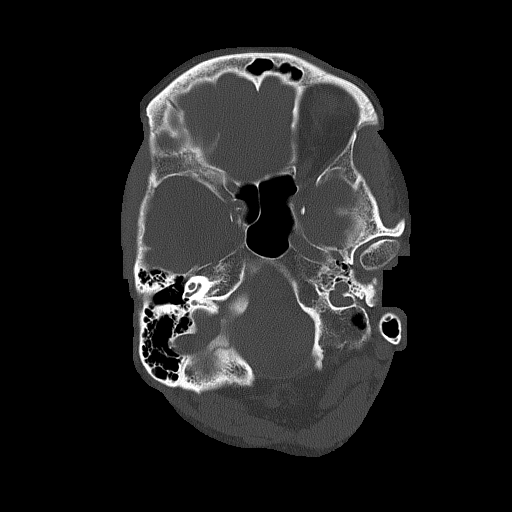
[im 15/74  bone]
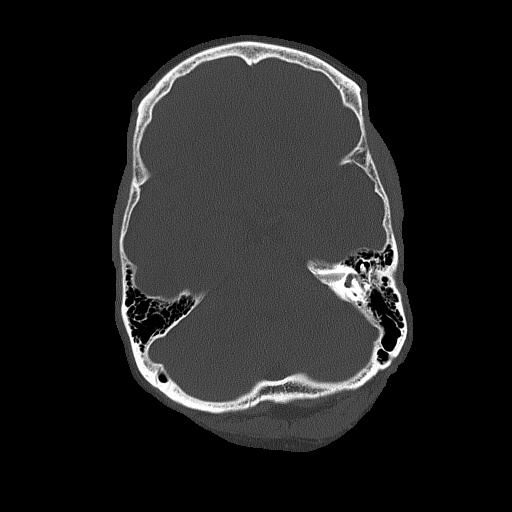
[im 22/74  bone]
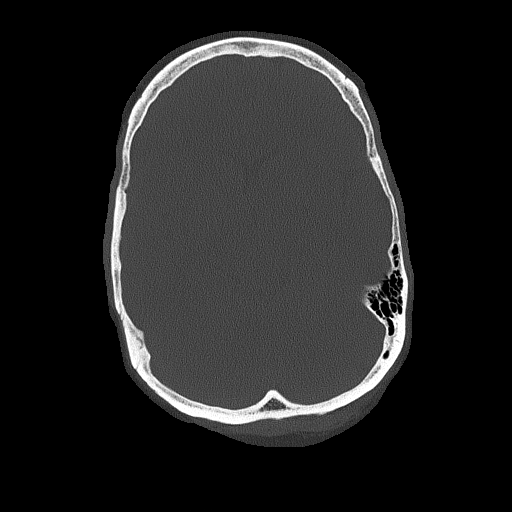
[im 33/74  bone]
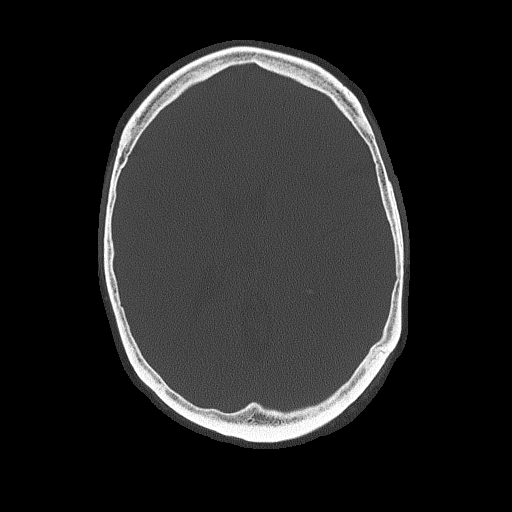

[Series 4: coronal soft · coronal · 0.32mm/px · 3 of 71 slices shown]
[im 24/71  brain]
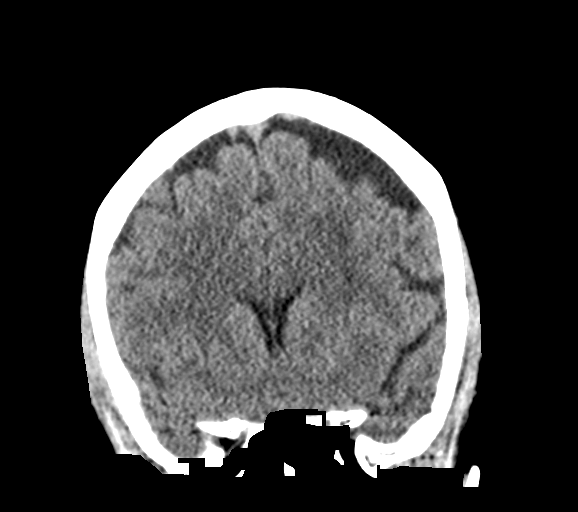
[im 32/71  brain]
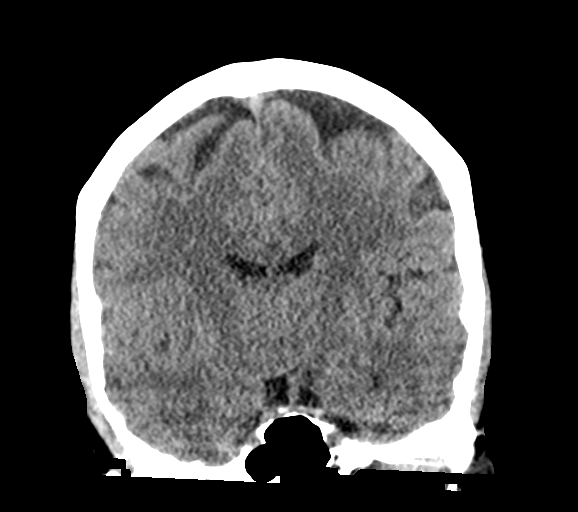
[im 39/71  brain]
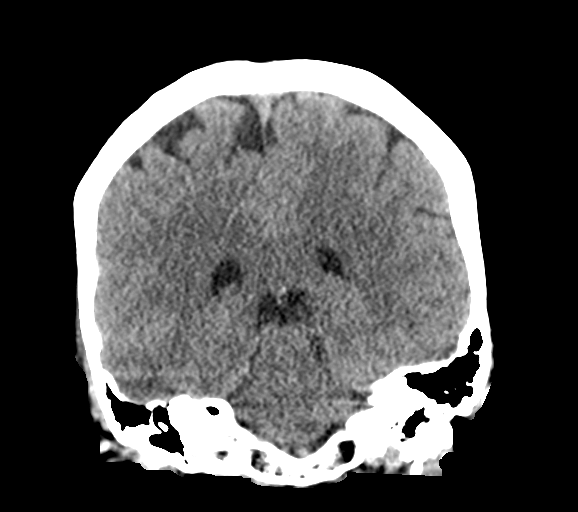

[Series 5: sagittal soft · sagittal · 0.33mm/px · 3 of 57 slices shown]
[im 19/57  brain]
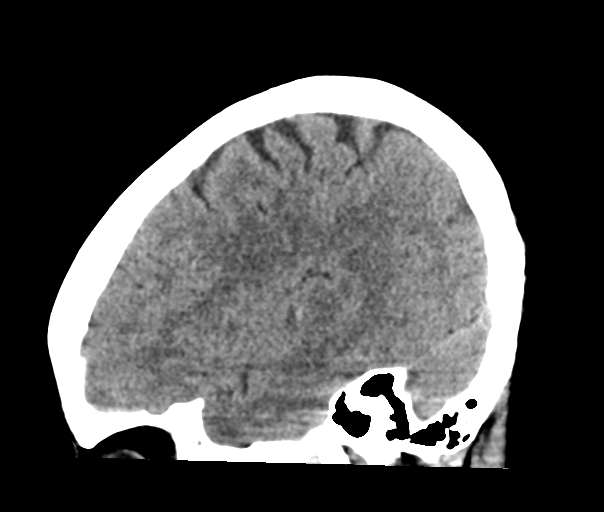
[im 29/57  brain]
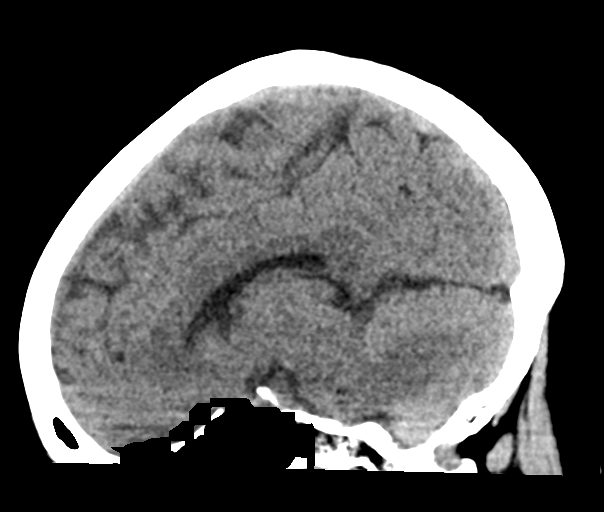
[im 38/57  brain]
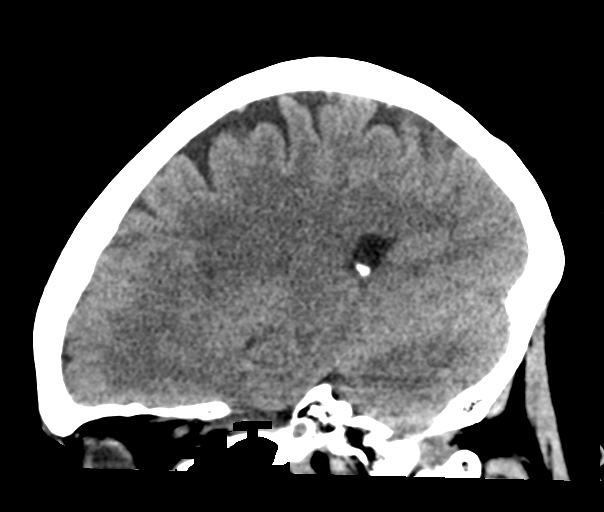

[17 of 47 positions shown; findings below may reference images not displayed]

FINDINGS: Brain: No evidence of acute infarction, hemorrhage, hydrocephalus,
extra-axial collection or mass lesion/mass effect.

Vascular: No hyperdense vessel or unexpected calcification.

Skull: Normal. Negative for fracture or focal lesion.

Sinuses/Orbits: No acute finding.

Other: None.
IMPRESSION: No acute intracranial abnormality.

## 2022-01-20 MED ORDER — ASPIRIN 81 MG PO CHEW
81.0000 mg | CHEWABLE_TABLET | Freq: Every day | ORAL | Status: DC
  Administered 2022-01-21 – 2022-01-22 (×2): 81 mg via ORAL
  Filled 2022-01-20 (×2): qty 1

## 2022-01-20 NOTE — ED Triage Notes (Signed)
Right shoulder pain, started today, decreased range of motion, unable to lift arm above head w/out pain. States she was doing laundry when pain began. Denies intervention @ home.

## 2022-01-20 NOTE — ED Provider Notes (Signed)
[facility] EMERGENCY DEPARTMENT Provider Note   CSN: [id number] Arrival date & time: 01/20/22  5465     History  Chief Complaint  Patient presents with  . Shoulder Pain    Jacqueline Leon is a 66 y.o. female.   Shoulder Pain Associated symptoms: no back pain and no fever     66 year old female presents emergency department with complaints of right shoulder going limp.  She states that she was doing laundry around 4 PM this afternoon when she completely lost the ability to move her right arm.  Symptoms lasted for about 30 minutes before she is able to move her arm again.  She presents emergency department for concern of stroke/TIA.  She has a history of right shoulder pain secondary to known osteoarthritis but no other known injury/trauma to affected arm.  She is currently denying any symptoms.  Denies previous CVA.  Denies fever, chills, night sweats, chest pain, shortness of breath, abdominal pain, N/V/D, urinary/vaginal symptoms, change in bowel habits.    Home Medications Prior to Admission medications   Medication Sig Start Date End Date Taking? Authorizing Provider  cholecalciferol (VITAMIN D) 1000 UNITS tablet Take 1,000 Units by mouth daily.   Yes [provider]  hydrochlorothiazide (MICROZIDE) 12.5 MG capsule Take 1 capsule (12.5 mg total) by mouth daily. 08/02/19  Yes Adline Potter, NP  meclizine (ANTIVERT) 25 MG tablet Take 25 mg by mouth daily as needed for dizziness.   Yes [provider]  Multiple Vitamin (MULTIVITAMIN) tablet Take 1 tablet by mouth daily.   Yes [provider]  naproxen sodium (ALEVE) 220 MG tablet Take 440 mg by mouth daily as needed (pain).   Yes [provider]  simvastatin (ZOCOR) 20 MG tablet Take 1 daily Patient taking differently: Take 20 mg by mouth daily. Take 1 daily 08/02/19  Yes Adline Potter, NP      Allergies    Patient has no known allergies.    Review of Systems   Review of  Systems  Constitutional:  Negative for chills and fever.  HENT:  Negative for ear pain and sore throat.   Eyes:  Negative for pain and visual disturbance.  Respiratory:  Negative for cough and shortness of breath.   Cardiovascular:  Negative for chest pain and palpitations.  Gastrointestinal:  Negative for abdominal pain and vomiting.  Genitourinary:  Negative for dysuria and hematuria.  Musculoskeletal:  Negative for arthralgias and back pain.       Temporary paralysis of right upper extremity.  Skin:  Negative for color change and rash.  Neurological:  Negative for seizures and syncope.  All other systems reviewed and are negative.   Physical Exam Updated Vital Signs BP 105/83   Pulse 66   Temp 98.1 F (36.7 C) (Oral)   Resp 13   Ht 5\' 4"  (1.626 m)   Wt 83.9 kg   SpO2 98%   BMI 31.76 kg/m  Physical Exam Vitals and nursing note reviewed.  Constitutional:      General: She is not in acute distress.    Appearance: She is well-developed.  HENT:     Head: Normocephalic and atraumatic.  Eyes:     Extraocular Movements: Extraocular movements intact.     Conjunctiva/sclera: Conjunctivae normal.     Pupils: Pupils are equal, round, and reactive to light.  Cardiovascular:     Rate and Rhythm: Normal rate and regular rhythm.     Heart sounds: No murmur heard. Pulmonary:  Effort: Pulmonary effort is normal. No respiratory distress.     Breath sounds: Normal breath sounds.  Abdominal:     Palpations: Abdomen is soft.     Tenderness: There is no abdominal tenderness.  Musculoskeletal:        General: No swelling.     Cervical back: Neck supple.  Skin:    General: Skin is warm and dry.     Capillary Refill: Capillary refill takes less than 2 seconds.  Neurological:     Mental Status: She is alert.     Sensory: Sensation is intact.     Motor: Motor function is intact. No seizure activity or pronator drift.     Coordination: Coordination is intact. Romberg sign negative.  Coordination normal. Finger-Nose-Finger Test and Heel to [facility] Test normal.     Deep Tendon Reflexes: Reflexes are normal and symmetric.     Comments: Cranial 3 through 12 grossly intact.  No facial asymmetry or dysarthria noticed.  No sensory deficits along major nerve distributions of upper or lower extremities.  Muscle strength 5/5 upper and lower extremities.  Posterior tibial and radial pulses full and intact bilaterally.  Psychiatric:        Mood and Affect: Mood normal.     ED Results / Procedures / Treatments   Labs (all labs ordered are listed, but only abnormal results are displayed) Labs Reviewed  LIPID PANEL - Abnormal; Notable for the following components:      Result Value   LDL Cholesterol 116 (*)    All other components within normal limits  RESP PANEL BY RT-PCR (FLU A&B, COVID) ARPGX2  ETHANOL  PROTIME-INR  APTT  CBC  DIFFERENTIAL  COMPREHENSIVE METABOLIC PANEL  RAPID URINE DRUG SCREEN, HOSP PERFORMED  URINALYSIS, ROUTINE W REFLEX MICROSCOPIC  HEMOGLOBIN A1C  I-STAT CHEM 8, ED    EKG None  Radiology CT Head Wo Contrast  Result Date: 01/20/2022 CLINICAL DATA:  Transient ischemic attack. Right shoulder pain decreased range of motion unable to lift arm above head without pain. EXAM: CT HEAD WITHOUT CONTRAST TECHNIQUE: Contiguous axial images were obtained from the base of the skull through the vertex without intravenous contrast. RADIATION DOSE REDUCTION: This exam was performed according to the departmental dose-optimization program which includes automated exposure control, adjustment of the mA and/or kV according to patient size and/or use of iterative reconstruction technique. COMPARISON:  None Available. FINDINGS: Brain: No evidence of acute infarction, hemorrhage, hydrocephalus, extra-axial collection or mass lesion/mass effect. Vascular: No hyperdense vessel or unexpected calcification. Skull: Normal. Negative for fracture or focal lesion. Sinuses/Orbits: No  acute finding. Other: None. IMPRESSION: No acute intracranial abnormality. Electronically Signed   By: Larose Hires D.O.   On: 01/20/2022 21:44   DG Shoulder Right  Result Date: 01/20/2022 CLINICAL DATA:  Right shoulder pain starting today. Was doing laundry when pain began. EXAM: RIGHT SHOULDER - 2+ VIEW COMPARISON:  None Available. FINDINGS: Normal alignment. Minimal peripheral acromioclavicular degenerative osteophytosis. No significant degenerative change of the glenohumeral joint. No acute fracture is seen. No dislocation. IMPRESSION: Mild acromioclavicular osteoarthritis. Electronically Signed   By: Neita Garnet M.D.   On: 01/20/2022 17:14    Procedures Procedures    Medications Ordered in ED Medications  aspirin chewable tablet 81 mg (has no administration in time range)    ED Course/ Medical Decision Making/ A&P Clinical Course as of 01/20/22 2354  Tue Jan 20, 2022  2102 Talked to Dr. Monica Becton of Teleneurology and she agreed to see the  patient.  [CR]  2211 Consulted Dr. Monica Becton of neurology, she recommended admission of the patient with MRI in the morning.  She agreed to place appropriate laboratory and imaging studies.  Admission with hospital medicine.  [CR]  2348 Consulted Dr. Myrtha Mantis of hospital medicine, he agreed to admit and assume further treatment/care of patient [CR]    Clinical Course User Index [CR] Peter Garter, PA                           Medical Decision Making Amount and/or Complexity of Data Reviewed Labs: ordered. Radiology: ordered.  Risk Decision regarding hospitalization.   This patient presents to the ED for concern of right arm paralysis, this involves an extensive number of treatment options, and is a complaint that carries with it a high risk of complications and morbidity.  The differential diagnosis includes TIA, stroke, carotid artery stenosis, endocarditis, atrial fibrillation, CVA   Co morbidities that complicate the patient  evaluation  Hyperlipidemia, hypertension    Lab Tests:  I Ordered, and personally interpreted labs.  The pertinent results include: No acute abnormalities   Imaging Studies ordered:  I ordered imaging studies including CT head without contrast I independently visualized and interpreted imaging which showed no acute abnormalities I agree with the radiologist interpretation   Cardiac Monitoring: / EKG:  The patient was maintained on a cardiac monitor.  I personally viewed and interpreted the cardiac monitored which showed an underlying rhythm of: Sinus rhythm   Consultations Obtained:  See ED course  Problem List / ED Course / Critical interventions / Medication management  Temporary paralysis Reevaluation of the patient showed that the patient stayed the same I have reviewed the patients home medicines and have made adjustments as needed   Social Determinants of Health:  Denies tobacco, alcohol, illicit drug use.   Test / Admission - Considered:  Temporary paralysis of right arm. Vitals signs significant for within normal range and stable throughout visit. Laboratory/imaging studies significant for: No acute abnormalities.  Given patient's symptoms and relatively quick resolution, concern is for TIA.  Neurology and hospital medicine consulted regarding the patient and they agreed to see and assume further treatment/care of patient.  MRI is to be conducted in the morning.  Treatment plan was explained at length to patient and husband who is at bedside, they acknowledge understanding and were agreeable to said plan. The patient appears reasonably stabilized for admission considering the current resources, flow, and capabilities available in the ED at this time, and I doubt any other [facility] requiring further screening and/or treatment in the ED prior to admission.          Final Clinical Impression(s) / ED Diagnoses Final diagnoses:  TIA (transient ischemic attack)     Rx / DC Orders ED Discharge Orders     None         Peter Garter, [place] 01/20/22 2354    Vanetta Mulders, MD 01/31/22 2326

## 2022-01-20 NOTE — H&P (Signed)
History and Physical    Patient: Jacqueline Leon YIF:[id number] DOB: [birth year] DOA: 01/20/2022 DOS: the patient was seen and examined on 01/21/2022 PCP: Assunta Found, MD  Patient coming from: Home  Chief Complaint:  Chief Complaint  Patient presents with   Shoulder Pain   HPI: Jacqueline Leon is a 66 y.o. female with medical history significant of hypertension, hyperlipidemia, obesity who presents to the emergency due to sudden sensation of heaviness in right arm which occurred today around 4 PM while doing laundry, she lost the ability to be able to lift up the right arm, symptoms lasted about 30 minutes and resolved.  Due to concern for stroke, she presented to the emergency department for further evaluation and management.  She denies chest pain, shortness of breath, fever, chills, prior CVA, nausea, vomiting, abdominal pain.     ED Course: In the emergency department, she was hemodynamically stable.  Work-up in the ED showed normal CBC and BMP, lipid panel was normal except for LDL at 216, alcohol level was less than 10.  influenza A, B, SARS coronavirus 2 was negative. CTA head without contrast showed no acute intracranial abnormality Right shoulder x-ray showed mild acromioclavicular osteoarthritis Aspirin 81 mg x 1 was given.  Teleneurology was consulted who recommended admitting patient for further stroke work-up.  Hospitalist was asked to admit patient for further evaluation and management.  Review of Systems: Review of systems as noted in the HPI. All other systems reviewed and are negative.  Past Medical History:  Diagnosis Date   Abnormal Pap smear    History of cervical cancer    Hyperlipidemia    Hypertension    Inner ear inflammation    Vaginal Pap smear, abnormal    Past Surgical History:  Procedure Laterality Date   ABDOMINAL HYSTERECTOMY     ORIF ANKLE FRACTURE Left 07/12/2017   Procedure: OPEN REDUCTION INTERNAL FIXATION (ORIF) ANKLE FRACTURE;  Surgeon:  Vickki Hearing, MD;  Location: [facility];  Service: Orthopedics;  Laterality: Left;    Social History:  reports that she quit smoking about 15 years ago. Her smoking use included cigarettes. She has never used smokeless tobacco. She reports current alcohol use. She reports that she does not use drugs.   No Known Allergies  Family History  Problem Relation Age of Onset   Hypertension Mother    Other Mother        passed away from childbirth   Hypertension Father    Hypertension Maternal Grandfather    Other Brother        had a pacemaker   Kidney disease Brother    Other Daughter        on dialysis     Prior to Admission medications   Medication Sig Start Date End Date Taking? Authorizing Provider  cholecalciferol (VITAMIN D) 1000 UNITS tablet Take 1,000 Units by mouth daily.   Yes [provider]  hydrochlorothiazide (MICROZIDE) 12.5 MG capsule Take 1 capsule (12.5 mg total) by mouth daily. 08/02/19  Yes Adline Potter, NP  meclizine (ANTIVERT) 25 MG tablet Take 25 mg by mouth daily as needed for dizziness.   Yes [provider]  Multiple Vitamin (MULTIVITAMIN) tablet Take 1 tablet by mouth daily.   Yes [provider]  naproxen sodium (ALEVE) 220 MG tablet Take 440 mg by mouth daily as needed (pain).   Yes [provider]  simvastatin (ZOCOR) 20 MG tablet Take 1 daily Patient taking differently: Take 20 mg by  mouth daily. Take 1 daily 08/02/19  Yes Estill Dooms, NP    Physical Exam: BP 129/80 (BP Location: Left Arm)   Pulse 66   Temp 97.8 F (36.6 C) (Tympanic)   Resp 14   Ht 5\' 4"  (1.626 m)   Wt 83.9 kg   SpO2 98%   BMI 31.76 kg/m   General: 66 y.o. year-old female well developed well nourished in no acute distress.  Alert and oriented x3. HEENT: NCAT, EOMI Neck: Supple, trachea medial Cardiovascular: Regular rate and rhythm with no rubs or gallops.  No thyromegaly or JVD noted.  No lower extremity edema. 2/4 pulses  in all 4 extremities. Respiratory: Clear to auscultation with no wheezes or rales. Good inspiratory effort. Abdomen: Soft, nontender nondistended with normal bowel sounds x4 quadrants. Muskuloskeletal: No cyanosis, clubbing or edema noted bilaterally Neuro: CN II-XII intact, strength 5/5 x 4, sensation, reflexes intact Skin: No ulcerative lesions noted or rashes Psychiatry: Judgement and insight appear normal. Mood is appropriate for condition and setting          Labs on Admission:  Basic Metabolic Panel: Recent Labs  Lab 01/20/22 2006  NA 138  K 3.7  CL 105  CO2 26  GLUCOSE 94  BUN 18  CREATININE 0.83  CALCIUM 9.5   Liver Function Tests: Recent Labs  Lab 01/20/22 2006  AST 27  ALT 29  ALKPHOS 55  BILITOT 0.8  PROT 8.0  ALBUMIN 4.4   No results for input(s): "LIPASE", "AMYLASE" in the last 168 hours. No results for input(s): "AMMONIA" in the last 168 hours. CBC: Recent Labs  Lab 01/20/22 2006  WBC 9.7  NEUTROABS 6.5  HGB 14.7  HCT 44.9  MCV 93.0  PLT 232   Cardiac Enzymes: No results for input(s): "CKTOTAL", "CKMB", "CKMBINDEX", "TROPONINI" in the last 168 hours.  BNP (last 3 results) No results for input(s): "BNP" in the last 8760 hours.  ProBNP (last 3 results) No results for input(s): "PROBNP" in the last 8760 hours.  CBG: No results for input(s): "GLUCAP" in the last 168 hours.  Radiological Exams on Admission: CT Head Wo Contrast  Result Date: 01/20/2022 CLINICAL DATA:  Transient ischemic attack. Right shoulder pain decreased range of motion unable to lift arm above head without pain. EXAM: CT HEAD WITHOUT CONTRAST TECHNIQUE: Contiguous axial images were obtained from the base of the skull through the vertex without intravenous contrast. RADIATION DOSE REDUCTION: This exam was performed according to the departmental dose-optimization program which includes automated exposure control, adjustment of the mA and/or kV according to patient size and/or  use of iterative reconstruction technique. COMPARISON:  None Available. FINDINGS: Brain: No evidence of acute infarction, hemorrhage, hydrocephalus, extra-axial collection or mass lesion/mass effect. Vascular: No hyperdense vessel or unexpected calcification. Skull: Normal. Negative for fracture or focal lesion. Sinuses/Orbits: No acute finding. Other: None. IMPRESSION: No acute intracranial abnormality. Electronically Signed   By: Keane Police D.O.   On: 01/20/2022 21:44   DG Shoulder Right  Result Date: 01/20/2022 CLINICAL DATA:  Right shoulder pain starting today. Was doing laundry when pain began. EXAM: RIGHT SHOULDER - 2+ VIEW COMPARISON:  None Available. FINDINGS: Normal alignment. Minimal peripheral acromioclavicular degenerative osteophytosis. No significant degenerative change of the glenohumeral joint. No acute fracture is seen. No dislocation. IMPRESSION: Mild acromioclavicular osteoarthritis. Electronically Signed   By: Yvonne Kendall M.D.   On: 01/20/2022 17:14    EKG: I independently viewed the EKG done and my findings are as followed: Normal  sinus rhythm at a rate of 77 bpm  Assessment/Plan Present on Admission:  Transient ischemic attack (TIA)  Essential hypertension  Principal Problem:   Transient ischemic attack (TIA) Active Problems:   Essential hypertension   Mixed hyperlipidemia  Transient ischemic attack Patient will be admitted to telemetry unit  MR angio head and neck with and without contrast in the morning Echocardiogram in the morning MRI of brain without contrast in the morning Continue aspirin and statin Continue fall precautions and neuro checks Lipid panel and hemoglobin A1c will be checked Continue PT/SLP/OT eval and treat Bedside swallow eval by nursing prior to diet Telemetry neurology will be consulted and we shall await further recommendations.   Essential hypertension  Antihypertensives PRN if Blood pressure is greater than 220/120 or there is a  concern for End organ damage/contraindications for permissive HTN. If blood pressure is greater than 220/120 give labetalol PO or IV or Vasotec IV with a goal of 15% reduction in BP during the first 24 hours.   Mixed hyperlipidemia Continue Zocor  Obesity (BMI 31.76 kg/m) Diet and lifestyle modification   DVT prophylaxis: SCDs  Code Status: Full code  Consults: Teleneurology  Family Communication: Husband at bedside (all questions answered to satisfaction)  Severity of Illness: The appropriate patient status for this patient is OBSERVATION. Observation status is judged to be reasonable and necessary in order to provide the required intensity of service to ensure the patient's safety. The patient's presenting symptoms, physical exam findings, and initial radiographic and laboratory data in the context of their medical condition is felt to place them at decreased risk for further clinical deterioration. Furthermore, it is anticipated that the patient will be medically stable for discharge from the hospital within 2 midnights of admission.   Author: Frankey Shown, DO 01/21/2022 1:03 AM  For on call review [email or web address].

## 2022-01-20 NOTE — Consult Note (Addendum)
[facility] Tele[facility]urology Consult Services  Stat Consult  Patient Name:   Lyrical, Sowle Date of Birth:   06-30-1956 Identification Number:   MRN - [id number] Date of Service:   01/20/2022 20:54:35  Diagnosis:       G45.9 - Transient cerebral ischemic attack, unspecified  Impression 66 year old female who presents to the hospital after a 30 minute episode of right arm weakness. Presentation is concerning for possible TIA.   Recommendations: Our recommendations are outlined below.  Diagnostic Studies : MRI Head without contrast MRA head without contrast MRA neck with contrast TTE w/ shunt study  Laboratory Studies : Recommend Lipid panel Hemoglobin A1c  Antithrombotic Medications : Aspirin 81 mg PO dailyStatins for LDL goal less than 70  Nursing Recommendations : Telemetry, IV Fluids, avoid dextrose containing fluids, Maintain euglycemia Neuro checks q4 hrs x 24 hrs and then per shiftHead of bed 30 degrees  Consultations : Recommend Speech therapy if failed dysphagia screen Physical therapy/Occupational therapy  DVT Prophylaxis : Choice of Primary Team  Disposition : Neurology will follow  ----------------------------------------------------------------------------------------------------    Metrics: [facility] Notification Time: 01/20/2022 20:52:04 Stamp Time: 01/20/2022 20:54:35 Callback Response Time: 01/20/2022 20:59:26   CT HEAD: As Per Radiologist CT Head Showed No Acute Hemorrhage or Acute Core Infarct    ----------------------------------------------------------------------------------------------------  Chief Complaint: transient right arm weakness.  History of Present Illness: Patient is a 66 year old Female. 66 year old female who presents to the hospital because of right arm weakness. The right arm weakness lasted approximately 30 minutes and then improved. She was able to shower and do her hair without any  problems. She denied any numbness, tingling, speech problems, or any other weakness. On exam patient was at her baseline.   Past Medical History:      Hypertension      Hyperlipidemia  Medications:  No Anticoagulant use  No Antiplatelet use Reviewed EMR for current medications  Allergies:  Reviewed  Social History: Smoking: No  Family History:  There is no family history of premature cerebrovascular disease pertinent to this consultation  ROS : 14 Points Review of Systems was performed and was negative except mentioned in HPI.  Past Surgical History: There Is No Surgical History Contributory To Today's Visit   Examination: BP(142/78), Pulse(68), Blood Glucose(94) 1A: Level of Consciousness - Alert; keenly responsive + 0 1B: Ask Month and Age - Both Questions Right + 0 1C: Blink Eyes & Squeeze Hands - Performs Both Tasks + 0 2: Test Horizontal Extraocular Movements - Normal + 0 3: Test Visual Fields - No Visual Loss + 0 4: Test Facial Palsy (Use Grimace if Obtunded) - Normal symmetry + 0 5A: Test Left Arm Motor Drift - No Drift for 10 Seconds + 0 5B: Test Right Arm Motor Drift - No Drift for 10 Seconds + 0 6A: Test Left Leg Motor Drift - No Drift for 5 Seconds + 0 6B: Test Right Leg Motor Drift - No Drift for 5 Seconds + 0 7: Test Limb Ataxia (FNF/Heel-Shin) - No Ataxia + 0 8: Test Sensation - Normal; No sensory loss + 0 9: Test Language/Aphasia - Normal; No aphasia + 0 10: Test Dysarthria - Normal + 0 11: Test Extinction/Inattention - No abnormality + 0  NIHSS Score: 0     Patient / Family was informed the Neurology Consult would occur via TeleHealth consult by way of interactive audio and video telecommunications and consented to receiving care in this manner.  Patient is being  evaluated for possible acute neurologic impairment and high probability of imminent or life - threatening deterioration.I spent total of 35 minutes providing care to this patient,  including time for face to face visit via telemedicine, review of medical records, imaging studies and discussion of findings with providers, the patient and / or family.   Dr Joice Lofts   [facility] [phone]  Case [id number]

## 2022-01-21 ENCOUNTER — Observation Stay (HOSPITAL_COMMUNITY): Payer: Medicare Other

## 2022-01-21 ENCOUNTER — Observation Stay (HOSPITAL_BASED_OUTPATIENT_CLINIC_OR_DEPARTMENT_OTHER): Payer: Medicare Other

## 2022-01-21 ENCOUNTER — Encounter (HOSPITAL_COMMUNITY): Payer: Self-pay | Admitting: Internal Medicine

## 2022-01-21 DIAGNOSIS — E782 Mixed hyperlipidemia: Secondary | ICD-10-CM | POA: Diagnosis present

## 2022-01-21 DIAGNOSIS — I6389 Other cerebral infarction: Secondary | ICD-10-CM

## 2022-01-21 DIAGNOSIS — I10 Essential (primary) hypertension: Secondary | ICD-10-CM | POA: Diagnosis not present

## 2022-01-21 DIAGNOSIS — G459 Transient cerebral ischemic attack, unspecified: Secondary | ICD-10-CM | POA: Diagnosis not present

## 2022-01-21 LAB — RAPID URINE DRUG SCREEN, HOSP PERFORMED
Amphetamines: NOT DETECTED
Barbiturates: NOT DETECTED
Benzodiazepines: NOT DETECTED
Cocaine: NOT DETECTED
Opiates: NOT DETECTED
Tetrahydrocannabinol: NOT DETECTED

## 2022-01-21 LAB — ECHOCARDIOGRAM COMPLETE BUBBLE STUDY
AR max vel: 2.69 cm2
AV Area VTI: 2.85 cm2
AV Area mean vel: 2.52 cm2
AV Mean grad: 4 mmHg
AV Peak grad: 6.3 mmHg
Ao pk vel: 1.25 m/s
Area-P 1/2: 3.31 cm2
Calc EF: 64.9 %
MV VTI: 3.5 cm2
S' Lateral: 2.9 cm
Single Plane A2C EF: 69.4 %
Single Plane A4C EF: 58.8 %

## 2022-01-21 LAB — COMPREHENSIVE METABOLIC PANEL
ALT: 25 U/L (ref 0–44)
AST: 24 U/L (ref 15–41)
Albumin: 3.7 g/dL (ref 3.5–5.0)
Alkaline Phosphatase: 48 U/L (ref 38–126)
Anion gap: 6 (ref 5–15)
BUN: 15 mg/dL (ref 8–23)
CO2: 26 mmol/L (ref 22–32)
Calcium: 9.1 mg/dL (ref 8.9–10.3)
Chloride: 107 mmol/L (ref 98–111)
Creatinine, Ser: 0.77 mg/dL (ref 0.44–1.00)
GFR, Estimated: >60 mL/min (ref >60)
Glucose, Bld: 86 mg/dL (ref 70–99)
Potassium: 3.8 mmol/L (ref 3.5–5.1)
Sodium: 139 mmol/L (ref 135–145)
Total Bilirubin: 0.6 mg/dL (ref 0.3–1.2)
Total Protein: 7 g/dL (ref 6.5–8.1)

## 2022-01-21 LAB — CBC
HCT: 41 % (ref 36.0–46.0)
Hemoglobin: 13.3 g/dL (ref 12.0–15.0)
MCH: 30.4 pg (ref 26.0–34.0)
MCHC: 32.4 g/dL (ref 30.0–36.0)
MCV: 93.6 fL (ref 80.0–100.0)
Platelets: 207 10*3/uL (ref 150–400)
RBC: 4.38 MIL/uL (ref 3.87–5.11)
RDW: 13.4 % (ref 11.5–15.5)
WBC: 7.3 10*3/uL (ref 4.0–10.5)
nRBC: 0 % (ref 0.0–0.2)

## 2022-01-21 LAB — URINALYSIS, ROUTINE W REFLEX MICROSCOPIC
Bacteria, UA: NONE SEEN
Bilirubin Urine: NEGATIVE
Glucose, UA: NEGATIVE mg/dL
Hgb urine dipstick: NEGATIVE
Ketones, ur: NEGATIVE mg/dL
Nitrite: NEGATIVE
Protein, ur: NEGATIVE mg/dL
Specific Gravity, Urine: 1.014 (ref 1.005–1.030)
pH: 5 (ref 5.0–8.0)

## 2022-01-21 LAB — HEMOGLOBIN A1C
Hgb A1c MFr Bld: 5.2 % (ref 4.8–5.6)
Mean Plasma Glucose: 102.54 mg/dL

## 2022-01-21 LAB — PHOSPHORUS: Phosphorus: 3.6 mg/dL (ref 2.5–4.6)

## 2022-01-21 LAB — HIV ANTIBODY (ROUTINE TESTING W REFLEX): HIV Screen 4th Generation wRfx: NONREACTIVE

## 2022-01-21 LAB — MAGNESIUM: Magnesium: 2 mg/dL (ref 1.7–2.4)

## 2022-01-21 IMAGING — MR MR HEAD W/O CM
12 of 13 series · 41 of 48 positions shown · IV contrast (gadavist)
Comparison: Head CT [DATE]

CLINICAL DATA: TIA.  Right arm weakness.

EXAM:
MRI HEAD WITHOUT CONTRAST
MRA HEAD WITHOUT CONTRAST
MRA OF THE NECK WITHOUT AND WITH CONTRAST
TECHNIQUE: Multiplanar, multi-echo pulse sequences of the brain and surrounding
structures were acquired without intravenous contrast. Angiographic
images of the Circle of Willis were acquired using MRA technique
without intravenous contrast. Angiographic images of the neck were
acquired using MRA technique without and with intravenous contrast.
Carotid stenosis measurements (when applicable) are obtained
utilizing NASCET criteria, using the distal internal carotid
diameter as the denominator.
CONTRAST:  8mL GADAVIST GADOBUTROL 1 MMOL/ML IV SOLN

[Series 5: DWI · axial · 4.0mm · 0.88mm/px · z∈[-96,+37]mm · 3 of 36 slices shown (1 of 6)]
[im 1/36]
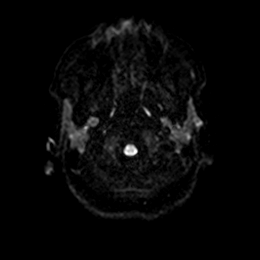
[im 18/36]
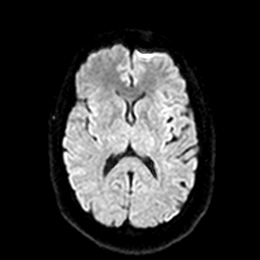
[im 36/36]
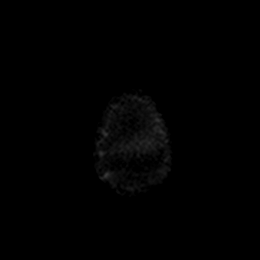

[Series 5: DWI · axial · 4.0mm · 0.88mm/px · z∈[-96,+37]mm · 4 of 36 slices shown (2 of 6)]
[im 1/36]
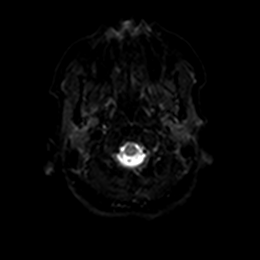
[im 12/36]
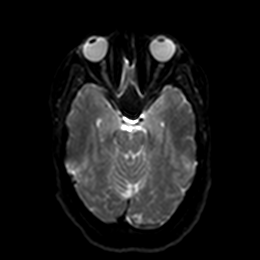
[im 24/36]
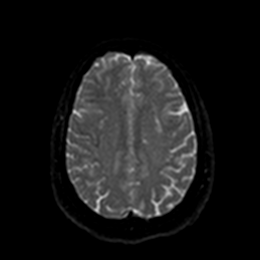
[im 36/36]
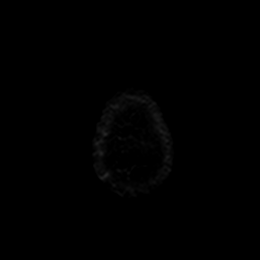

[Series 6: DWI · axial · 4.0mm · 0.88mm/px · z∈[-96,+37]mm · 4 of 36 slices shown (3 of 6)]
[im 1/36]
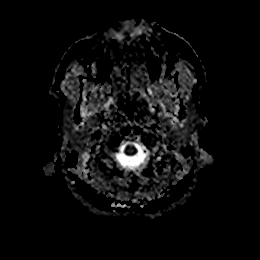
[im 12/36]
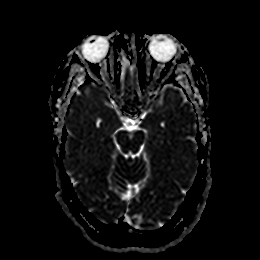
[im 24/36]
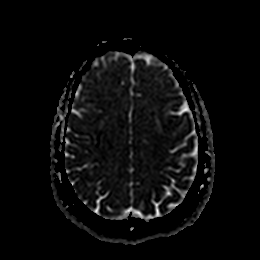
[im 36/36]
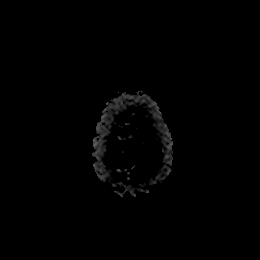

[Series 7: DWI · coronal · 4.0mm · 0.88mm/px · 4 of 32 slices shown (4 of 6)]
[im 1/32]
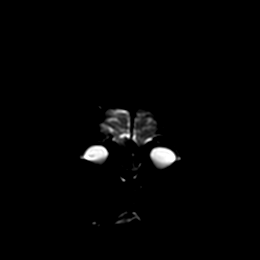
[im 11/32]
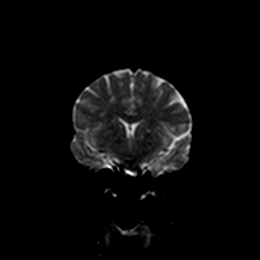
[im 21/32]
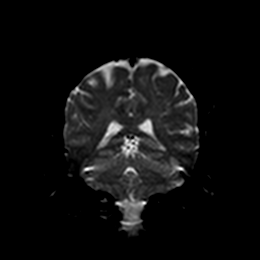
[im 32/32]
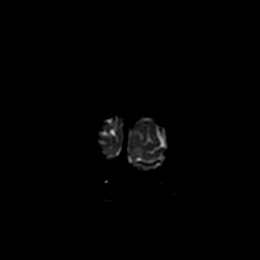

[Series 7: DWI · coronal · 4.0mm · 0.88mm/px · 4 of 32 slices shown (5 of 6)]
[im 1/32]
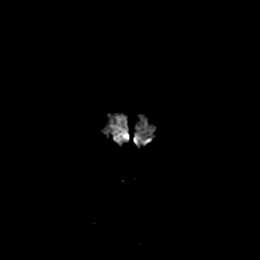
[im 11/32]
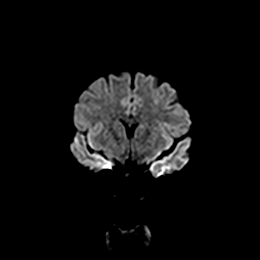
[im 21/32]
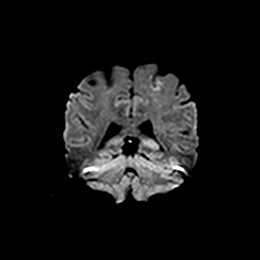
[im 32/32]
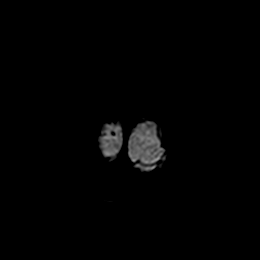

[Series 8: DWI · coronal · 4.0mm · 0.88mm/px · 4 of 32 slices shown (6 of 6)]
[im 1/32]
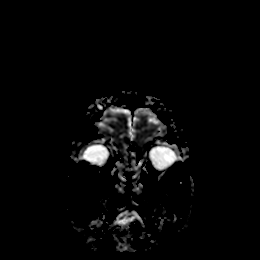
[im 11/32]
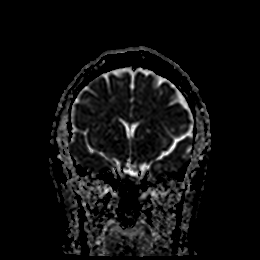
[im 21/32]
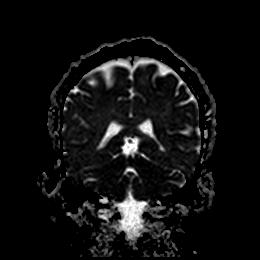
[im 32/32]
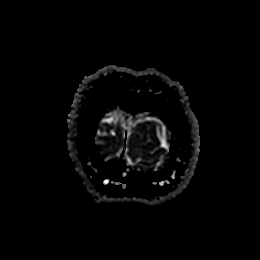

[Series 9: T1 · sagittal · 5.0mm · 0.80mm/px · 3 of 23 slices shown]
[im 1/23]
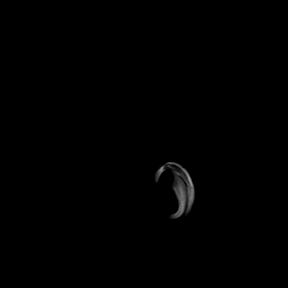
[im 12/23]
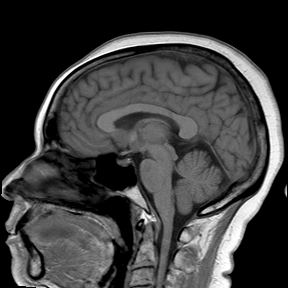
[im 23/23]
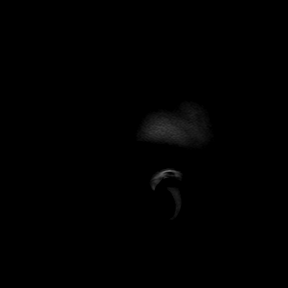

[Series 10: T2 · axial · 5.0mm · 0.72mm/px · z∈[-103,+44]mm · 3 of 23 slices shown (1 of 2)]
[im 1/23]
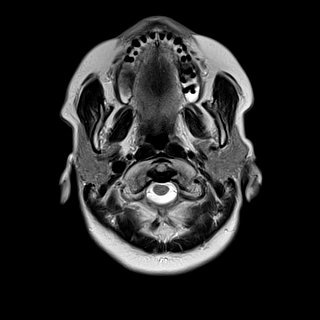
[im 12/23]
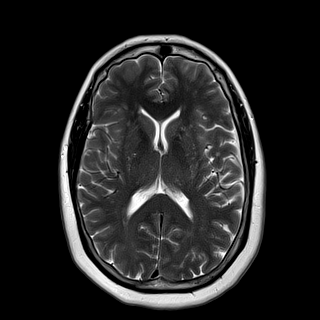
[im 23/23]
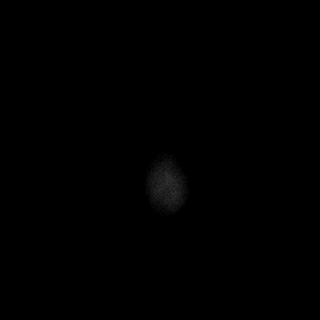

[Series 11: ax hemo · axial · 5.0mm · 0.86mm/px · z∈[-95,+42]mm · 3 of 25 slices shown]
[im 1/25]
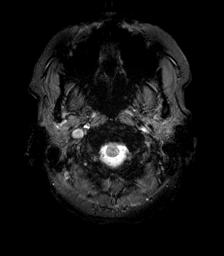
[im 13/25]
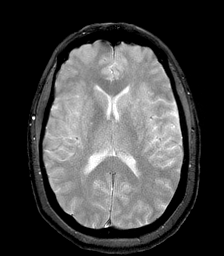
[im 25/25]
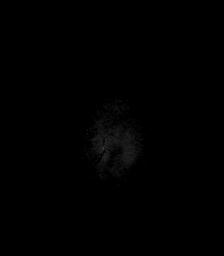

[Series 12: FLAIR · axial · 4.0mm · 0.43mm/px · z∈[-97,+44]mm · 4 of 38 slices shown (1 of 2)]
[im 1/38]
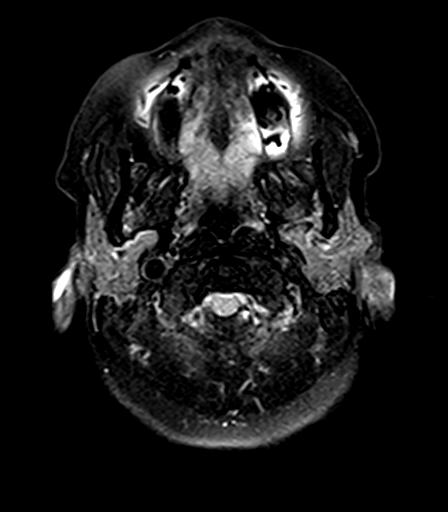
[im 13/38]
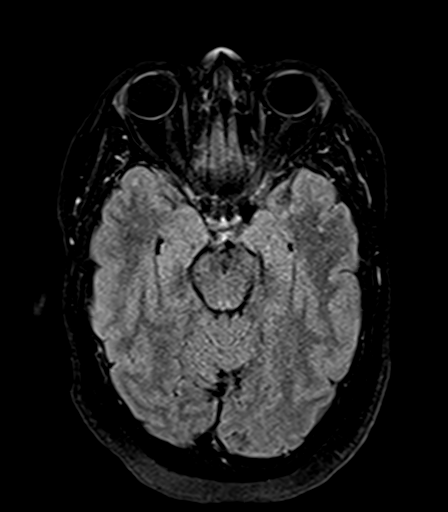
[im 25/38]
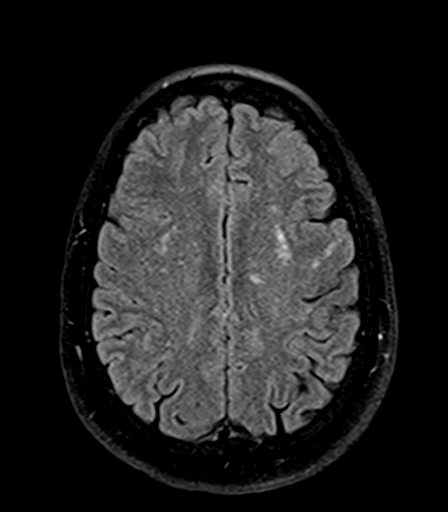
[im 38/38]
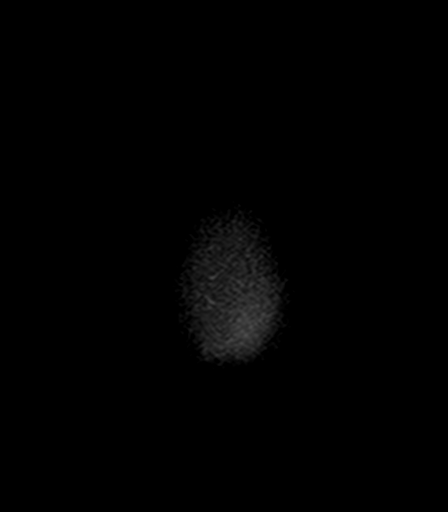

[Series 14: T2 · coronal · 5.0mm · 0.72mm/px · 3 of 28 slices shown (2 of 2)]
[im 1/28]
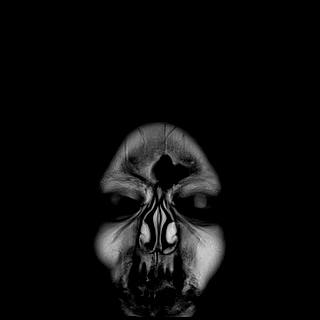
[im 14/28]
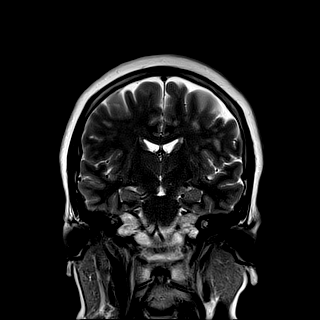
[im 28/28]
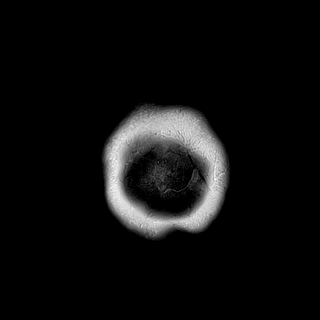

[Series 15: FLAIR · sagittal · 5.0mm · 0.94mm/px · 2 of 21 slices shown (2 of 2)]
[im 1/21]
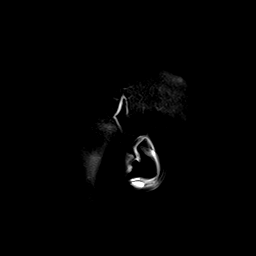
[im 21/21]
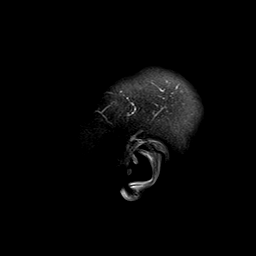

[41 of 48 positions shown; findings below may reference images not displayed]

FINDINGS: MR HEAD FINDINGS

Brain: There is a small acute infarct posteriorly in the left
frontal lobe involving cortex and subcortical white matter. Patchy
T2 hyperintensities elsewhere in the cerebral white matter
bilaterally are nonspecific but compatible with moderate chronic
small vessel ischemic disease. No intracranial hemorrhage, mass,
midline shift, or extra-axial fluid collection is identified. The
ventricles and sulci are normal.

Vascular: Major intracranial vascular flow voids are preserved.

Skull and upper cervical spine: Unremarkable bone marrow signal.

Sinuses/Orbits: Unremarkable orbits. Paranasal sinuses and mastoid
air cells are clear.

Other: None.

MRA HEAD FINDINGS

There is mild artifact from motion as well as bone/air interface at
the skull base.

Anterior circulation: The internal carotid arteries are patent from
skull base to carotid termini without evidence of significant
stenosis allowing for artifacts in the right paraclinoid region.
ACAs and MCAs are patent without evidence of a proximal branch
occlusion or significant proximal stenosis. No aneurysm is
identified.

Posterior circulation: The intracranial vertebral arteries are
patent to the basilar. Patent PICA and SCA origins are seen
bilaterally. The basilar artery is patent without evidence a
significant stenosis within limitations of artifact. There is a
small right posterior communicating artery. Both PCAs are patent
without evidence of a significant proximal stenosis. No aneurysm is
identified.

Anatomic variants: Absent right A1 segment, with the right ACA
arising from the proximal left A1 segment.

MRA NECK FINDINGS

Aortic arch: Normal variant aortic arch branching pattern with
aberrant right subclavian artery and common origin of the common
carotid arteries. Incomplete imaging of the proximal aspects of both
subclavian arteries with the included portions appearing widely
patent.

Right carotid system: Patent without evidence of stenosis or
dissection.

Left carotid system: Patent without evidence of stenosis or
dissection.

Vertebral arteries: Patent with antegrade flow bilaterally. Mildly
dominant left vertebral artery. No evidence stenosis or dissection.
IMPRESSION: 1. Small acute infarct in the posterior left frontal lobe.
2. Moderate chronic small vessel ischemic disease.
3. Negative head and neck MRA within above described mild study
limitations.

## 2022-01-21 IMAGING — MR MR MRA HEAD W/O CM
1 series · 48 of 48 positions shown · IV contrast (gadavist)
Comparison: Head CT [DATE]

CLINICAL DATA: TIA.  Right arm weakness.

EXAM:
MRI HEAD WITHOUT CONTRAST
MRA HEAD WITHOUT CONTRAST
MRA OF THE NECK WITHOUT AND WITH CONTRAST
TECHNIQUE: Multiplanar, multi-echo pulse sequences of the brain and surrounding
structures were acquired without intravenous contrast. Angiographic
images of the Circle of Willis were acquired using MRA technique
without intravenous contrast. Angiographic images of the neck were
acquired using MRA technique without and with intravenous contrast.
Carotid stenosis measurements (when applicable) are obtained
utilizing NASCET criteria, using the distal internal carotid
diameter as the denominator.
CONTRAST:  8mL GADAVIST GADOBUTROL 1 MMOL/ML IV SOLN

[Series 1: TOF · axial · 0.5mm · 0.76mm/px · z∈[-99,-24]mm · 48 of 168 slices shown]
[im 1/168]
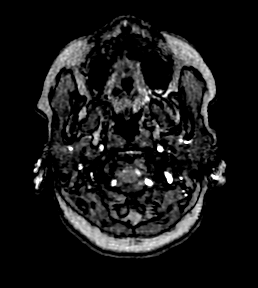
[im 4/168]
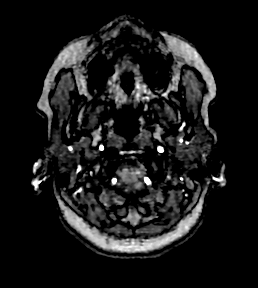
[im 8/168]
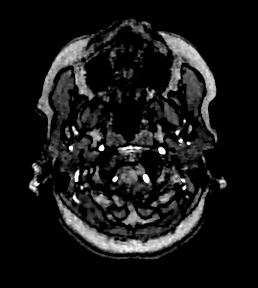
[im 11/168]
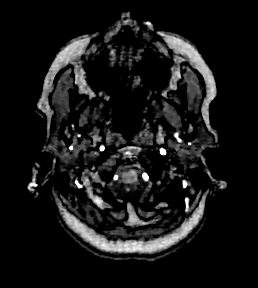
[im 15/168]
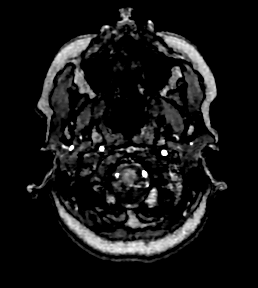
[im 18/168]
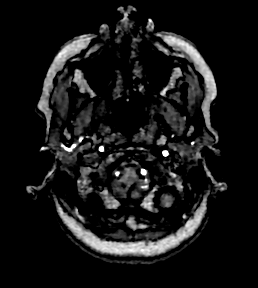
[im 22/168]
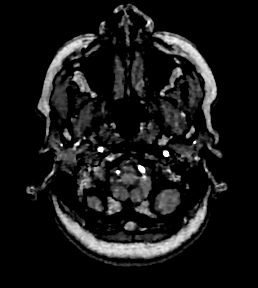
[im 25/168]
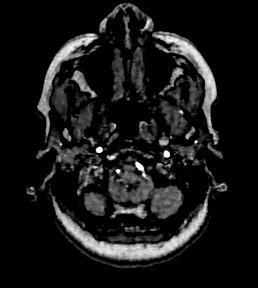
[im 29/168]
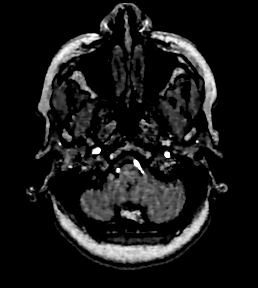
[im 32/168]
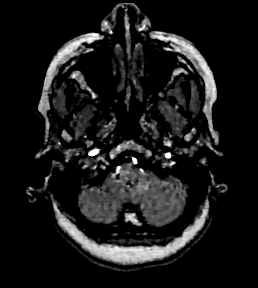
[im 36/168]
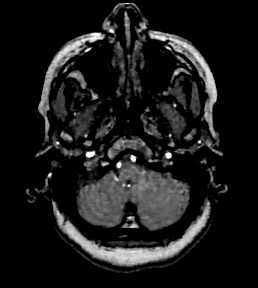
[im 40/168]
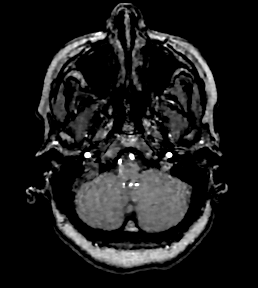
[im 43/168]
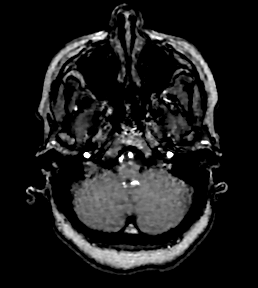
[im 47/168]
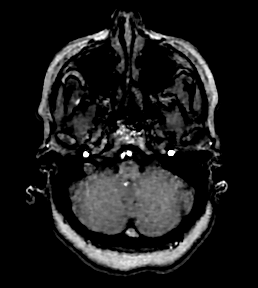
[im 50/168]
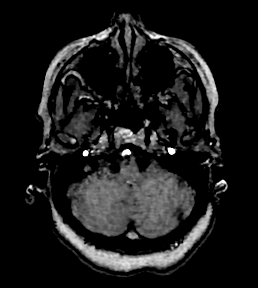
[im 54/168]
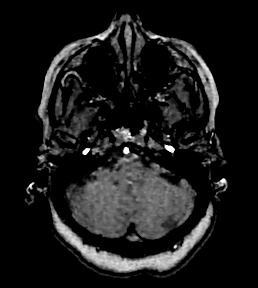
[im 57/168]
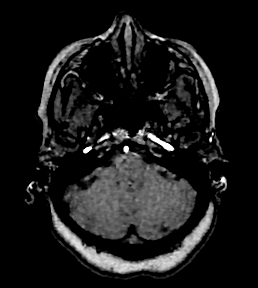
[im 61/168]
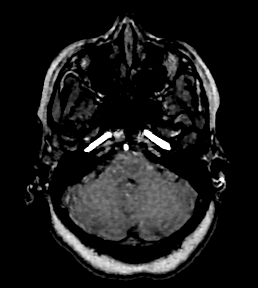
[im 64/168]
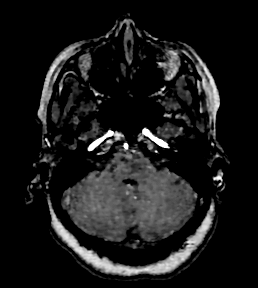
[im 68/168]
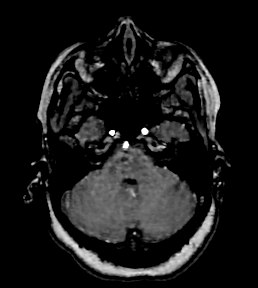
[im 72/168]
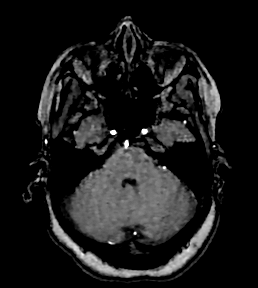
[im 75/168]
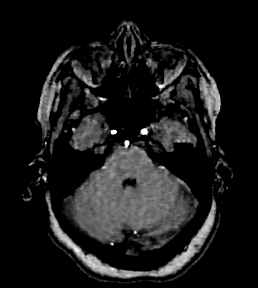
[im 79/168]
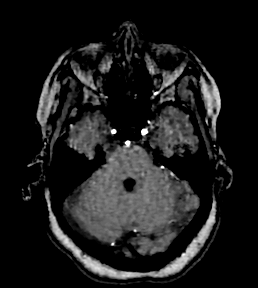
[im 82/168]
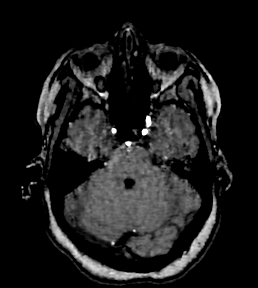
[im 86/168]
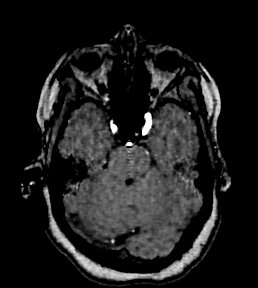
[im 89/168]
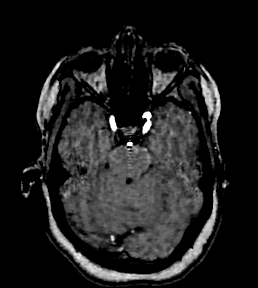
[im 93/168]
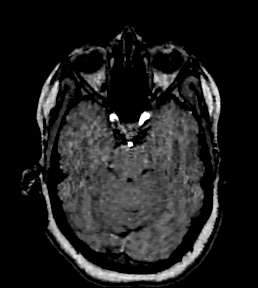
[im 96/168]
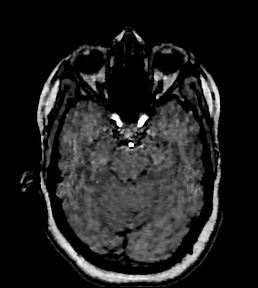
[im 100/168]
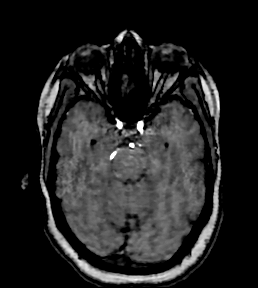
[im 104/168]
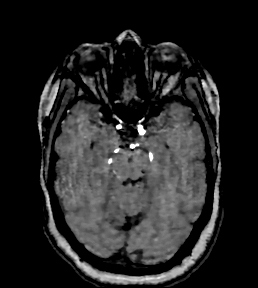
[im 107/168]
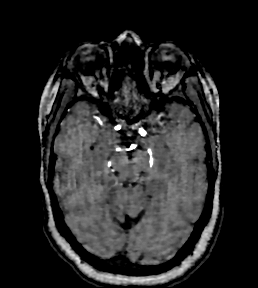
[im 111/168]
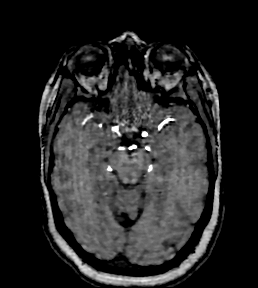
[im 114/168]
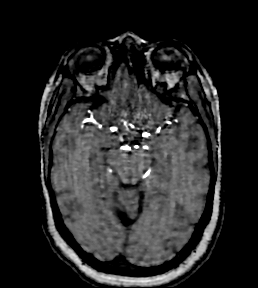
[im 118/168]
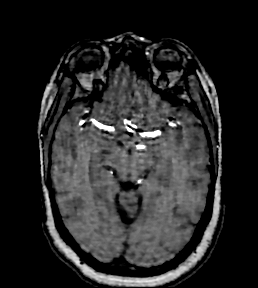
[im 121/168]
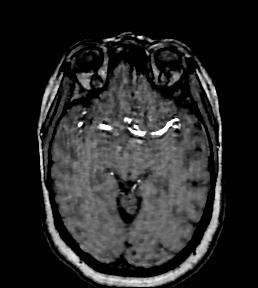
[im 125/168]
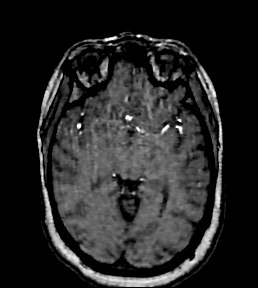
[im 128/168]
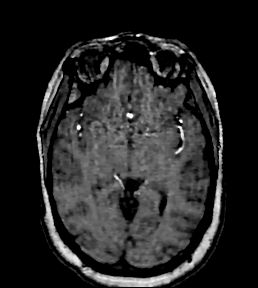
[im 132/168]
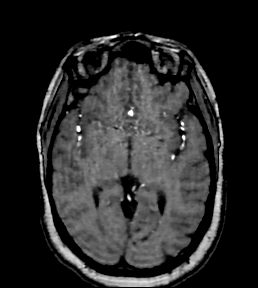
[im 136/168]
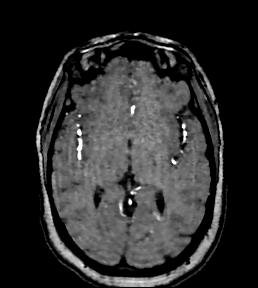
[im 139/168]
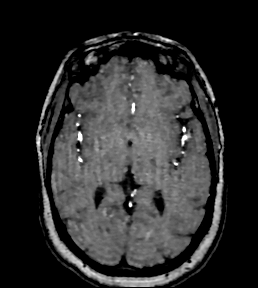
[im 143/168]
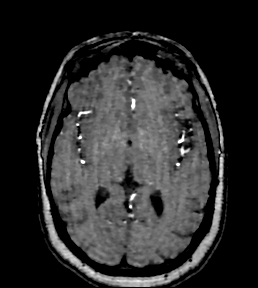
[im 146/168]
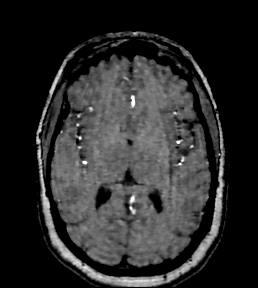
[im 150/168]
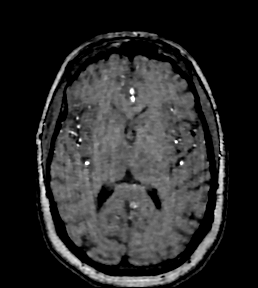
[im 153/168]
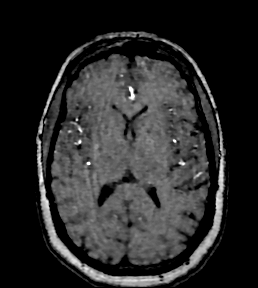
[im 157/168]
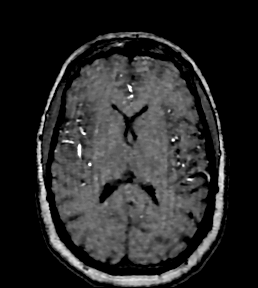
[im 160/168]
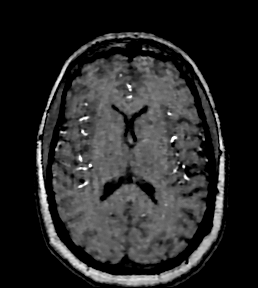
[im 164/168]
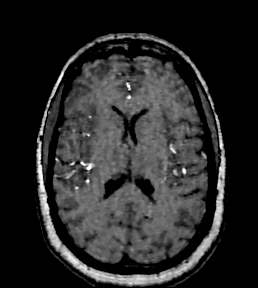
[im 168/168]
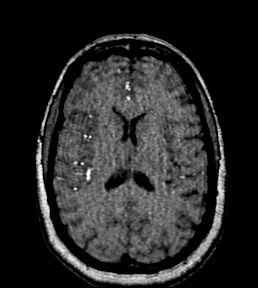

[48 of 48 positions shown; findings below may reference images not displayed]

FINDINGS: MR HEAD FINDINGS

Brain: There is a small acute infarct posteriorly in the left
frontal lobe involving cortex and subcortical white matter. Patchy
T2 hyperintensities elsewhere in the cerebral white matter
bilaterally are nonspecific but compatible with moderate chronic
small vessel ischemic disease. No intracranial hemorrhage, mass,
midline shift, or extra-axial fluid collection is identified. The
ventricles and sulci are normal.

Vascular: Major intracranial vascular flow voids are preserved.

Skull and upper cervical spine: Unremarkable bone marrow signal.

Sinuses/Orbits: Unremarkable orbits. Paranasal sinuses and mastoid
air cells are clear.

Other: None.

MRA HEAD FINDINGS

There is mild artifact from motion as well as bone/air interface at
the skull base.

Anterior circulation: The internal carotid arteries are patent from
skull base to carotid termini without evidence of significant
stenosis allowing for artifacts in the right paraclinoid region.
ACAs and MCAs are patent without evidence of a proximal branch
occlusion or significant proximal stenosis. No aneurysm is
identified.

Posterior circulation: The intracranial vertebral arteries are
patent to the basilar. Patent PICA and SCA origins are seen
bilaterally. The basilar artery is patent without evidence a
significant stenosis within limitations of artifact. There is a
small right posterior communicating artery. Both PCAs are patent
without evidence of a significant proximal stenosis. No aneurysm is
identified.

Anatomic variants: Absent right A1 segment, with the right ACA
arising from the proximal left A1 segment.

MRA NECK FINDINGS

Aortic arch: Normal variant aortic arch branching pattern with
aberrant right subclavian artery and common origin of the common
carotid arteries. Incomplete imaging of the proximal aspects of both
subclavian arteries with the included portions appearing widely
patent.

Right carotid system: Patent without evidence of stenosis or
dissection.

Left carotid system: Patent without evidence of stenosis or
dissection.

Vertebral arteries: Patent with antegrade flow bilaterally. Mildly
dominant left vertebral artery. No evidence stenosis or dissection.
IMPRESSION: 1. Small acute infarct in the posterior left frontal lobe.
2. Moderate chronic small vessel ischemic disease.
3. Negative head and neck MRA within above described mild study
limitations.

## 2022-01-21 IMAGING — MR MR MRA NECK WO/W CM
3 series · 39 of 48 positions shown · IV contrast (8 ml Gadavist)
Comparison: Head CT [DATE]

CLINICAL DATA: TIA.  Right arm weakness.

EXAM:
MRI HEAD WITHOUT CONTRAST
MRA HEAD WITHOUT CONTRAST
MRA OF THE NECK WITHOUT AND WITH CONTRAST
TECHNIQUE: Multiplanar, multi-echo pulse sequences of the brain and surrounding
structures were acquired without intravenous contrast. Angiographic
images of the Circle of Willis were acquired using MRA technique
without intravenous contrast. Angiographic images of the neck were
acquired using MRA technique without and with intravenous contrast.
Carotid stenosis measurements (when applicable) are obtained
utilizing NASCET criteria, using the distal internal carotid
diameter as the denominator.
CONTRAST:  8mL GADAVIST GADOBUTROL 1 MMOL/ML IV SOLN

[Series 11: tof_fl3d_tra_iso · axial · 0.6mm · 0.52mm/px · z∈[-158,-81]mm · 20 of 133 slices shown]
[im 1/133]
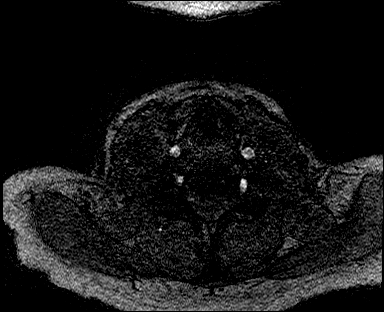
[im 7/133]
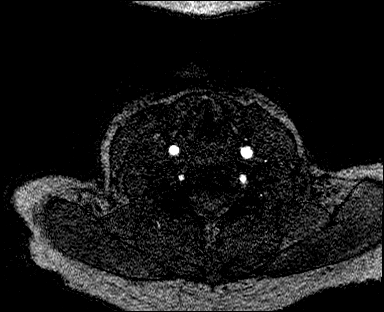
[im 14/133]
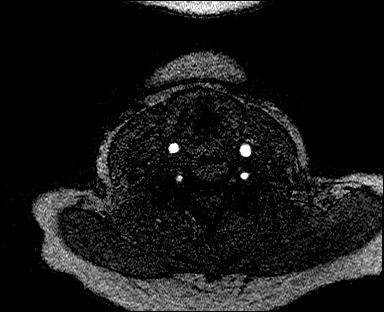
[im 21/133]
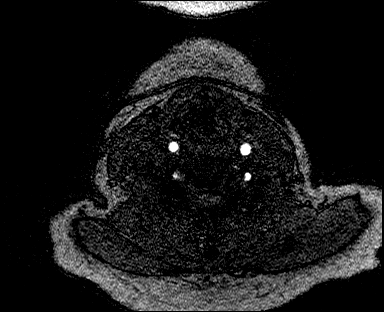
[im 28/133]
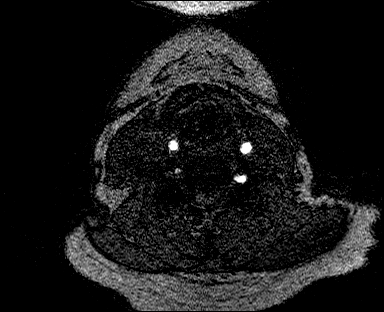
[im 35/133]
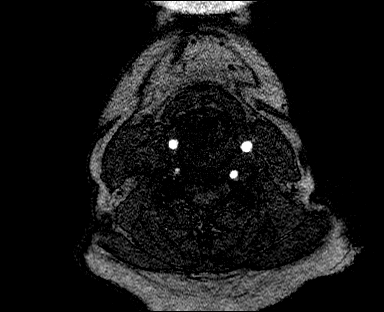
[im 42/133]
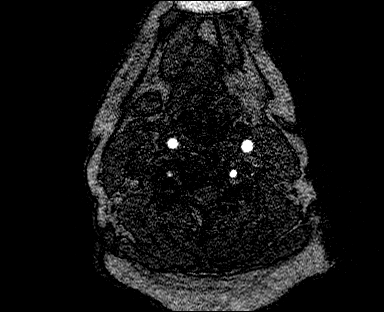
[im 49/133]
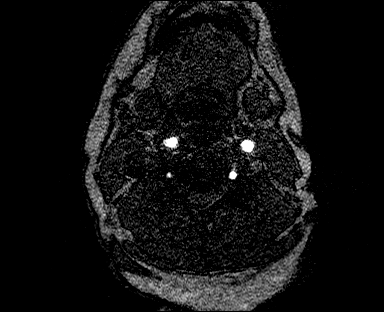
[im 56/133]
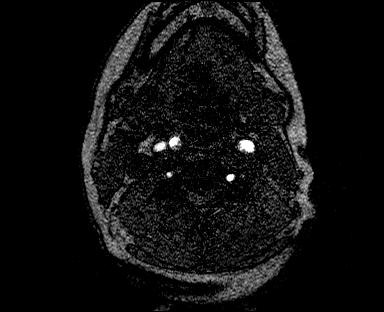
[im 63/133]
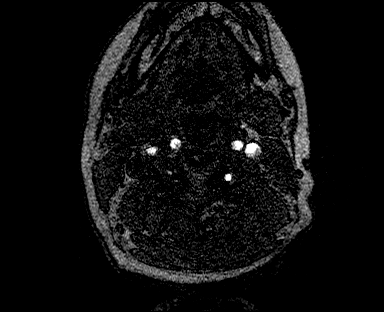
[im 70/133]
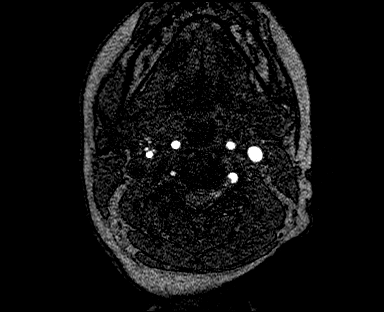
[im 77/133]
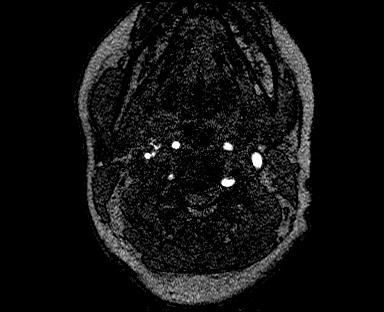
[im 84/133]
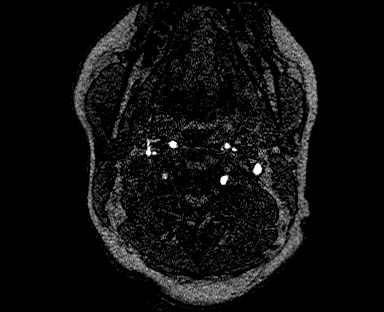
[im 91/133]
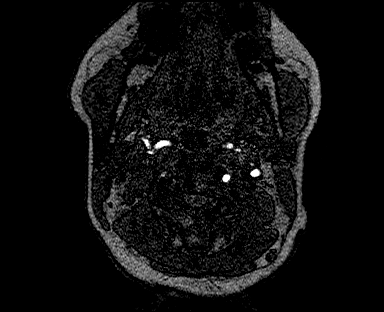
[im 98/133]
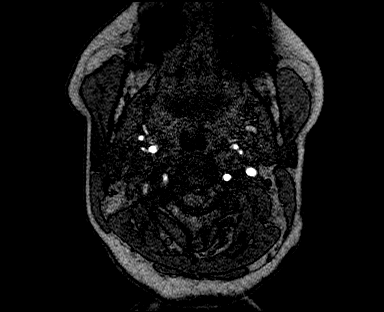
[im 105/133]
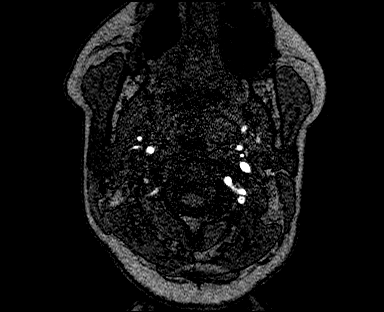
[im 112/133]
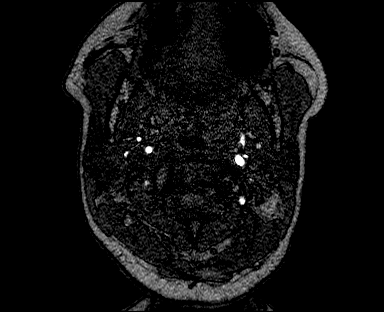
[im 119/133]
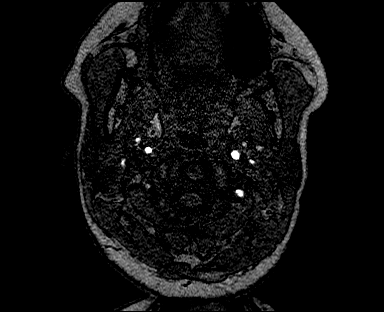
[im 126/133]
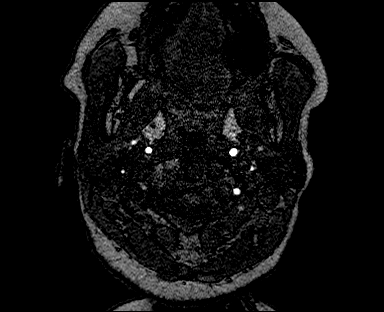
[im 133/133]
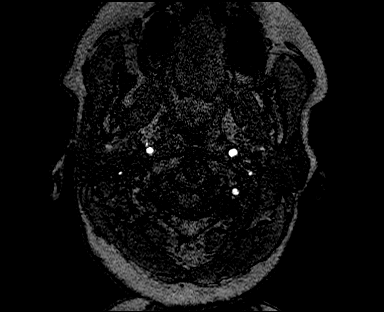

[Series 16: angio_fl3d_cor_post_ttc=3.0s · coronal · 0.9mm · 0.85mm/px · 11 of 88 slices shown]
[im 1/88]
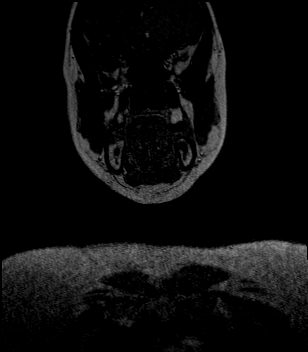
[im 7/88]
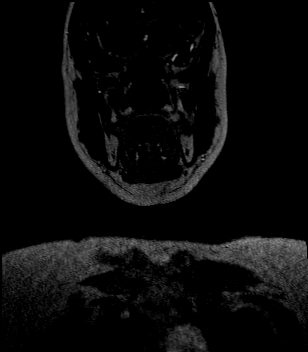
[im 14/88]
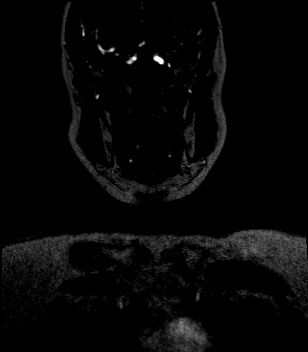
[im 21/88]
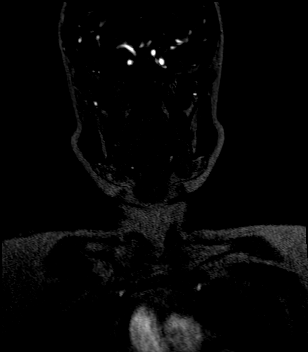
[im 27/88]
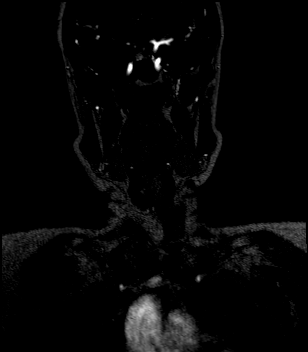
[im 34/88]
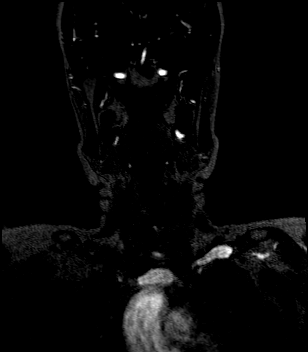
[im 41/88]
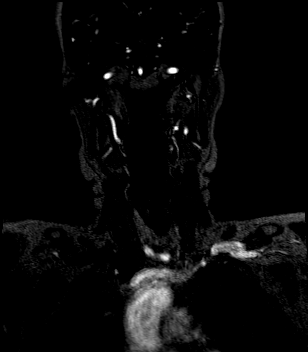
[im 47/88]
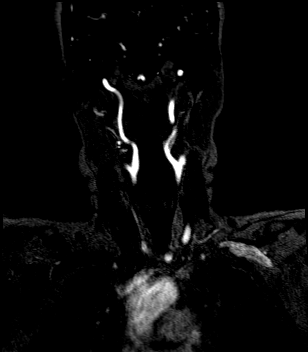
[im 61/88]
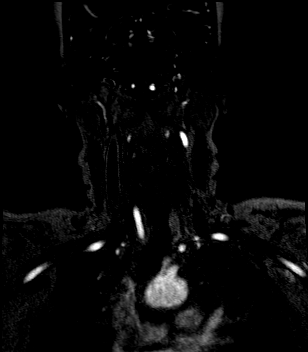
[im 74/88]
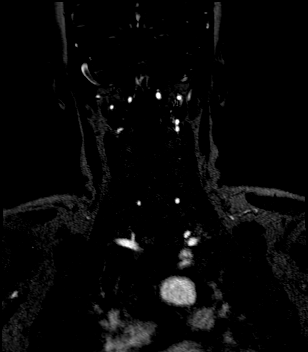
[im 88/88]
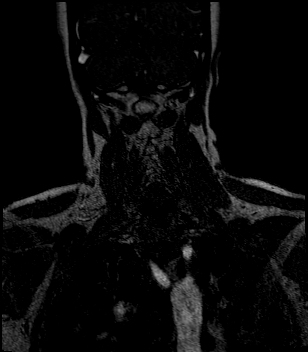

[Series 18: angio_fl3d_cor_post_ttc=3.0s_moco-adv_sub · coronal · 0.9mm · 0.85mm/px · 8 of 88 slices shown]
[im 1/88]
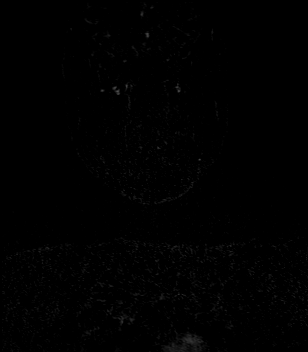
[im 14/88]
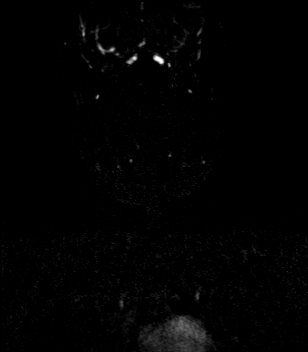
[im 27/88]
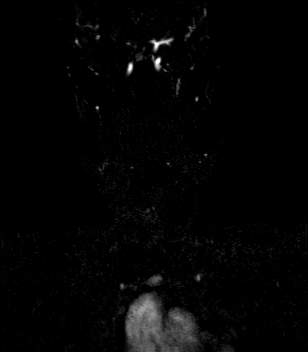
[im 41/88]
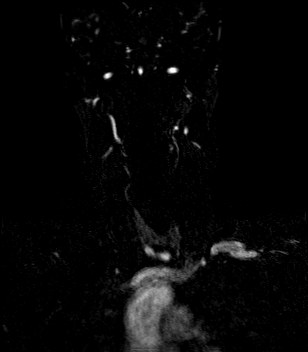
[im 47/88]
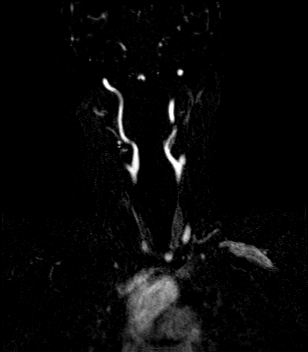
[im 61/88]
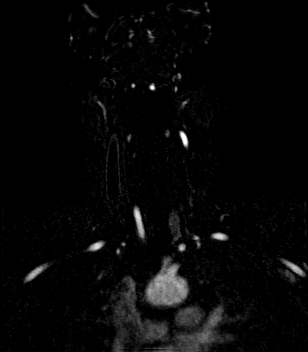
[im 74/88]
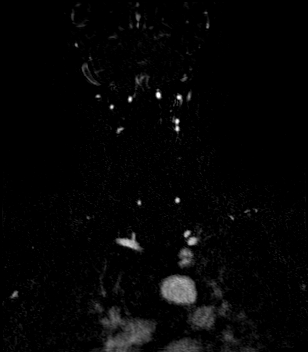
[im 88/88]
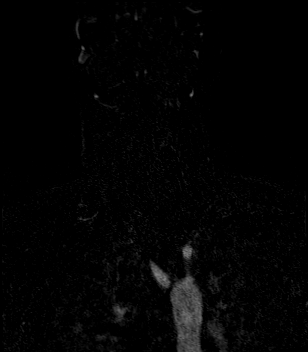

[39 of 48 positions shown; findings below may reference images not displayed]

FINDINGS: MR HEAD FINDINGS

Brain: There is a small acute infarct posteriorly in the left
frontal lobe involving cortex and subcortical white matter. Patchy
T2 hyperintensities elsewhere in the cerebral white matter
bilaterally are nonspecific but compatible with moderate chronic
small vessel ischemic disease. No intracranial hemorrhage, mass,
midline shift, or extra-axial fluid collection is identified. The
ventricles and sulci are normal.

Vascular: Major intracranial vascular flow voids are preserved.

Skull and upper cervical spine: Unremarkable bone marrow signal.

Sinuses/Orbits: Unremarkable orbits. Paranasal sinuses and mastoid
air cells are clear.

Other: None.

MRA HEAD FINDINGS

There is mild artifact from motion as well as bone/air interface at
the skull base.

Anterior circulation: The internal carotid arteries are patent from
skull base to carotid termini without evidence of significant
stenosis allowing for artifacts in the right paraclinoid region.
ACAs and MCAs are patent without evidence of a proximal branch
occlusion or significant proximal stenosis. No aneurysm is
identified.

Posterior circulation: The intracranial vertebral arteries are
patent to the basilar. Patent PICA and SCA origins are seen
bilaterally. The basilar artery is patent without evidence a
significant stenosis within limitations of artifact. There is a
small right posterior communicating artery. Both PCAs are patent
without evidence of a significant proximal stenosis. No aneurysm is
identified.

Anatomic variants: Absent right A1 segment, with the right ACA
arising from the proximal left A1 segment.

MRA NECK FINDINGS

Aortic arch: Normal variant aortic arch branching pattern with
aberrant right subclavian artery and common origin of the common
carotid arteries. Incomplete imaging of the proximal aspects of both
subclavian arteries with the included portions appearing widely
patent.

Right carotid system: Patent without evidence of stenosis or
dissection.

Left carotid system: Patent without evidence of stenosis or
dissection.

Vertebral arteries: Patent with antegrade flow bilaterally. Mildly
dominant left vertebral artery. No evidence stenosis or dissection.
IMPRESSION: 1. Small acute infarct in the posterior left frontal lobe.
2. Moderate chronic small vessel ischemic disease.
3. Negative head and neck MRA within above described mild study
limitations.

## 2022-01-21 MED ORDER — STROKE: EARLY STAGES OF RECOVERY BOOK: Freq: Once | Status: DC

## 2022-01-21 MED ORDER — SIMVASTATIN 20 MG PO TABS
20.0000 mg | ORAL_TABLET | Freq: Every day | ORAL | Status: DC
  Administered 2022-01-21: 20 mg via ORAL
  Filled 2022-01-21: qty 1

## 2022-01-21 MED ORDER — GADOBUTROL 1 MMOL/ML IV SOLN
8.0000 mL | Freq: Once | INTRAVENOUS | Status: AC | PRN
  Administered 2022-01-21: 8 mL via INTRAVENOUS

## 2022-01-21 MED ORDER — SIMVASTATIN 20 MG PO TABS
40.0000 mg | ORAL_TABLET | Freq: Every day | ORAL | Status: DC
  Administered 2022-01-22: 40 mg via ORAL
  Filled 2022-01-21: qty 2

## 2022-01-21 NOTE — Assessment & Plan Note (Addendum)
Her blood pressure remained stable during he hospitalization.  At the time of her discharge she will resume HCTZ. Follow up as outpatient.

## 2022-01-21 NOTE — Evaluation (Signed)
Occupational Therapy Evaluation Patient Details Name: Jacqueline Leon MRN: [id number] DOB: 07-29-1956 Today's Date: 01/21/2022   History of Present Illness Jacqueline Leon is a 66 y.o. female with medical history significant of hypertension, hyperlipidemia, obesity who presents to the emergency due to sudden sensation of heaviness in right arm which occurred today around 4 PM while doing laundry, she lost the ability to be able to lift up the right arm, symptoms lasted about 30 minutes and resolved.  Due to concern for stroke, she presented to the emergency department for further evaluation and management.  She denies chest pain, shortness of breath, fever, chills, prior CVA, nausea, vomiting, abdominal pain.   Clinical Impression   Pt agreeable to OT and PT co-evaluation. Pt reports that symptoms have resolved. Pt appears to be at or near baseline levels with [facility] UE coordination and strength. Pt able to ambulate in room and hall independently. Pt is not recommended for further acute OT services and will be discharged to care of nursing staff for remaining length of stay.      Recommendations for follow up therapy are one component of a multi-disciplinary discharge planning process, led by the attending physician.  Recommendations may be updated based on patient status, additional functional criteria and insurance authorization.   Follow Up Recommendations  No OT follow up    Assistance Recommended at Discharge None  Patient can return home with the following      Functional Status Assessment  Patient has not had a recent decline in their functional status  Equipment Recommendations  None recommended by OT    Recommendations for Other Services       Precautions / Restrictions Precautions Precautions: None Restrictions Weight Bearing Restrictions: No      Mobility Bed Mobility Overal bed mobility: Independent                  Transfers Overall transfer level:  Independent                 General transfer comment: Able to go from bed to ambulation in hall and back to bed with independence.      Balance Overall balance assessment: Independent                                         ADL either performed or assessed with clinical judgement   ADL Overall ADL's : Independent                                       General ADL Comments: able to don shoes at EOB independently     Vision Baseline Vision/History: 1 Wears glasses Ability to See in Adequate Light: 1 Impaired Patient Visual Report: No change from baseline Vision Assessment?: No apparent visual deficits     Perception     Praxis      Pertinent Vitals/Pain Pain Assessment Pain Assessment: 0-10 Pain Score: 7  Pain Location: R shoulder Pain Descriptors / Indicators: Sore Pain Intervention(s): Monitored during session, Repositioned     Hand Dominance Right   Extremity/Trunk Assessment Upper Extremity Assessment Upper Extremity Assessment: Overall WFL for tasks assessed   Lower Extremity Assessment Lower Extremity Assessment: Defer to PT evaluation   Cervical / Trunk Assessment Cervical / Trunk Assessment: Normal   Communication  Communication Communication: No difficulties   Cognition Arousal/Alertness: Awake/alert Behavior During Therapy: WFL for tasks assessed/performed Overall Cognitive Status: Within Functional Limits for tasks assessed                                                        Home Living Family/patient expects to be discharged to:: Private residence Living Arrangements: Spouse/significant other Available Help at Discharge: Family;Available 24 hours/day Type of Home: House Home Access: Stairs to enter [employer] of Steps: 3 Entrance Stairs-Rails: Can reach both Home Layout: Two level Alternate Level Stairs-Number of Steps: 13 Alternate Level Stairs-Rails: Right  (going up) Bathroom Shower/Tub: Chief Strategy Officer: Handicapped height Bathroom Accessibility: Yes How Accessible: Accessible via walker Home Equipment: Rolling Walker (2 wheels);Cane - single point;BSC/3in1          Prior Functioning/Environment Prior Level of Function : Independent/Modified Independent             Mobility Comments: community ambulator without AD ADLs Comments: Independent                      OT Goals(Current goals can be found in the care plan section) Acute Rehab OT Goals Patient Stated Goal: return home  OT Frequency:      Co-evaluation PT/OT/SLP Co-Evaluation/Treatment: Yes Reason for Co-Treatment: To address functional/ADL transfers   OT goals addressed during session: ADL's and self-care      AM-PAC OT "6 Clicks" Daily Activity     Outcome Measure Help from another person eating meals?: None Help from another person taking care of personal grooming?: None Help from another person toileting, which includes using toliet, bedpan, or urinal?: None Help from another person bathing (including washing, rinsing, drying)?: None Help from another person to put on and taking off regular upper body clothing?: None Help from another person to put on and taking off regular lower body clothing?: None 6 Click Score: 24   End of Session    Activity Tolerance: Patient tolerated treatment well Patient left: in bed;with call bell/phone within reach;with family/visitor present  OT Visit Diagnosis: Other symptoms and signs involving the nervous system (R29.898);Muscle weakness (generalized) (M62.81)                Time: 1914-7829 OT Time Calculation (min): 10 min Charges:  OT General Charges $OT Visit: 1 Visit OT Evaluation $OT Eval Low Complexity: 1 Low  Javonnie Illescas OT, MOT  Danie Chandler 01/21/2022, 9:30 AM

## 2022-01-21 NOTE — Progress Notes (Signed)
*  PRELIMINARY RESULTS* Echocardiogram 2D Echocardiogram has been performed.  Jacqueline Leon 01/21/2022, 11:30 AM

## 2022-01-21 NOTE — Evaluation (Signed)
Physical Therapy Evaluation Patient Details Name: Jacqueline Leon Pralle MRN: [id number] DOB: Sep 14, 1955 Today's Date: 01/21/2022  History of Present Illness  Samaira Holzworth Bedonie is a 66 y.o. female with medical history significant of hypertension, hyperlipidemia, obesity who presents to the emergency due to sudden sensation of heaviness in right arm which occurred today around 4 PM while doing laundry, she lost the ability to be able to lift up the right arm, symptoms lasted about 30 minutes and resolved.  Due to concern for stroke, she presented to the emergency department for further evaluation and management.  She denies chest pain, shortness of breath, fever, chills, prior CVA, nausea, vomiting, abdominal pain.   Clinical Impression  Patient presents sitting at bedside with spouse present and consents to PT/OT co-evaluation. Patient is independent with bed mobility demonstrating appropriate strength and trunk control needed to complete task. Patient independent with transfers with good balance and coordination observed. Patient able to ambulate 100 feet independently with minor gait deviations but gait pattern close to WNL. Slight unsteadiness with decline surfaces but able to maintain gait pattern indicating good return for home. Patient functioning near/at baseline for functional mobility and gait. PLAN:  Patient to be discharged home today and discharged from acute physical therapy to care of nursing for ambulation as tolerated for length of stay with recommendations stated below    Recommendations for follow up therapy are one component of a multi-disciplinary discharge planning process, led by the attending physician.  Recommendations may be updated based on patient status, additional functional criteria and insurance authorization.  Follow Up Recommendations No PT follow up      Assistance Recommended at Discharge PRN  Patient can return home with the following  A little help with walking and/or  transfers;Help with stairs or ramp for entrance    Equipment Recommendations None recommended by PT  Recommendations for Other Services       Functional Status Assessment Patient has not had a recent decline in their functional status     Precautions / Restrictions Precautions Precautions: None Restrictions Weight Bearing Restrictions: No      Mobility  Bed Mobility Overal bed mobility: Independent             General bed mobility comments: Independent with bed mobility with appropriate strength and trunk control demonstrated.    Transfers Overall transfer level: Independent                 General transfer comment: Independent with STS with good balance and coordination    Ambulation/Gait Ambulation/Gait assistance: Independent Gait Distance (Feet): 100 Feet Assistive device: None Gait Pattern/deviations: Step-through pattern, Decreased step length - right, Decreased step length - left, Decreased stride length, WFL(Within Functional Limits) Gait velocity: normal     General Gait Details: Ambulates 100 feet independently with minor gait deviations but overall WNL. Slight unsteadiness with decline surfaces but able to maintain gait pattern indicating good return for home  Stairs            Wheelchair Mobility    Modified Rankin (Stroke Patients Only)       Balance Overall balance assessment: Independent                                           Pertinent Vitals/Pain Pain Assessment Pain Assessment: 0-10 Pain Score: 7  Pain Location: R shoulder Pain Descriptors / Indicators: Sore  Pain Intervention(s): Monitored during session, Repositioned    Home Living Family/patient expects to be discharged to:: Private residence Living Arrangements: Spouse/significant other Available Help at Discharge: Family;Available 24 hours/day Type of Home: House Home Access: Stairs to enter Entrance Stairs-Rails: Can reach both Entrance  Stairs-Number of Steps: 3 Alternate Level Stairs-Number of Steps: 13 Home Layout: Two level Home Equipment: Agricultural consultant (2 wheels);Cane - single point;BSC/3in1      Prior Function Prior Level of Function : Independent/Modified Independent             Mobility Comments: community ambulator without AD ADLs Comments: Independent     Hand Dominance   Dominant Hand: Right    Extremity/Trunk Assessment   Upper Extremity Assessment Upper Extremity Assessment: Overall WFL for tasks assessed    Lower Extremity Assessment Lower Extremity Assessment: Overall WFL for tasks assessed    Cervical / Trunk Assessment Cervical / Trunk Assessment: Normal  Communication   Communication: No difficulties  Cognition Arousal/Alertness: Awake/alert Behavior During Therapy: WFL for tasks assessed/performed Overall Cognitive Status: Within Functional Limits for tasks assessed                                          General Comments      Exercises     Assessment/Plan    PT Assessment Patient does not need any further PT services  PT Problem List         PT Treatment Interventions      PT Goals (Current goals can be found in the Care Plan section)  Acute Rehab PT Goals Patient Stated Goal: return home PT Goal Formulation: With patient/family Time For Goal Achievement: 01/21/22 Potential to Achieve Goals: Good    Frequency       Co-evaluation   Reason for Co-Treatment: To address functional/ADL transfers           AM-PAC PT "6 Clicks" Mobility  Outcome Measure Help needed turning from your back to your side while in a flat bed without using bedrails?: None Help needed moving from lying on your back to sitting on the side of a flat bed without using bedrails?: None Help needed moving to and from a bed to a chair (including a wheelchair)?: None Help needed standing up from a chair using your arms (e.g., wheelchair or bedside chair)?: None Help  needed to walk in hospital room?: None Help needed climbing 3-5 steps with a railing? : A Little 6 Click Score: 23    End of Session   Activity Tolerance: Patient tolerated treatment well Patient left: in bed;with call bell/phone within reach;with family/visitor present Nurse Communication: Mobility status PT Visit Diagnosis: Unsteadiness on feet (R26.81);Other abnormalities of gait and mobility (R26.89);Muscle weakness (generalized) (M62.81)    Time: 9381-8299 PT Time Calculation (min) (ACUTE ONLY): 10 min   Charges:   PT Evaluation $PT Eval Low Complexity: 1 Low PT Treatments $Therapeutic Activity: 8-22 mins        1:53 PM, 01/21/22 Arther Abbott, S/PT

## 2022-01-21 NOTE — Progress Notes (Signed)
  Progress Note   Patient: Jacqueline Leon TIW:[id number] DOB: 1955-08-23 DOA: 01/20/2022     0 DOS: the patient was seen and examined on 01/21/2022   Brief hospital course: Mrs. Yasin was admitted to the hospital with the working diagnosis of TIA.   66 yo female with the past medical history of hypertension, dyslipidemia and obesity who presented with right arm paresthesias. She reported sudden onset of heaviness sensation in the right arm, unable to lift up upper extremity lasting for 30 minutes, prompting her to come to the hospital.  On her initial physical examination her blood pressure was 129/80, HR 66, RR 14 02 saturation 98%,lungs clear to auscultation, heart with S1 and S2 present and rhythmic, abdomen not distended and no lower extremity edema.   Head CT negative for acute changes.   EKG 77 bpm, left axis deviation, normal intervals, sinus rhythm with no significant ST segment or T wave changes.   MRI brain with small acute infarct in the posterior left frontal lobe. Negative head and neck MRA.     Assessment and Plan: * Transient ischemic attack (TIA) MRI positive for CVA.   Plan to continue blood pressure control.  Continue with aspirin and will increase statin therapy.  Follow with PT, OT and final neurology recommendations.   Essential hypertension Blood pressure has been 101 to 139 mmHg,  During her hospitalization will target blood pressure less than 160 mmHg,  As outpatient likely will need less 130 mmHg.    Mixed hyperlipidemia Continue statin therapy, increase dose to 40 mg daily.  Lipid profile with LDL 116 and HDL 72         Subjective: Patient with resolution of her right arm paresthesias.   Physical Exam: Vitals:   01/21/22 0400 01/21/22 0430 01/21/22 0600 01/21/22 1404  BP: 138/75 138/75 101/88 139/72  Pulse: 63 63 70 72  Resp: 16 16 16 20   Temp: 98.1 F (36.7 C) 98.1 F (36.7 C) (!) 97.5 F (36.4 C) 98.6 F (37 C)  TempSrc: Tympanic Tympanic  Tympanic Oral  SpO2: 99% 99% 99% 92%  Weight:      Height:       Neurology awake and alert ENT with no pallor Cardiovascular with S1 and S2 present and rhythmic Respiratory with no rales Abdomen soft and not distended No lower extremity edema  Data Reviewed:    Family Communication: I spoke with patient's husband at the bedside, we talked in detail about patient's condition, plan of care and prognosis and all questions were addressed.   Disposition: Status is: Observation The patient remains OBS appropriate and will d/c before 2 midnights.  Planned Discharge Destination: Home     Author: , MD 01/21/2022 4:02 PM  For on call review [email or web address].

## 2022-01-21 NOTE — Care Management Obs Status (Signed)
MEDICARE OBSERVATION STATUS NOTIFICATION   Patient Details  Name: Jacqueline Leon MRN: [id number] Date of Birth: 1956-04-08   Medicare Observation Status Notification Given:  Yes    Corey Harold 01/21/2022, 4:18 PM

## 2022-01-21 NOTE — Hospital Course (Signed)
Jacqueline Leon was admitted to the hospital with the working diagnosis of TIA.   66 yo female with the past medical history of hypertension, dyslipidemia and obesity who presented with right arm paresthesias. She reported sudden onset of heaviness sensation in the right arm, unable to lift up upper extremity lasting for 30 minutes, prompting her to come to the hospital.  On her initial physical examination her blood pressure was 129/80, HR 66, RR 14 02 saturation 98%,lungs clear to auscultation, heart with S1 and S2 present and rhythmic, abdomen not distended and no lower extremity edema.   Head CT negative for acute changes.   EKG 77 bpm, left axis deviation, normal intervals, sinus rhythm with no significant ST segment or T wave changes.   MRI brain with small acute infarct in the posterior left frontal lobe. Negative head and neck MRA.

## 2022-01-21 NOTE — TOC Progression Note (Signed)
  Transition of Care [facility]) Screening Note   Patient Details  Name: Eloyce Bultman Dancy Date of Birth: 08/13/1955   Transition of Care [facility]) CM/SW Contact:    Elliot Gault, LCSW Phone Number: 01/21/2022, 8:58 AM    Transition of Care Department [facility]) has reviewed patient and no TOC needs have been identified at this time. We will continue to monitor patient advancement through interdisciplinary progression rounds. If new patient transition needs arise, please place a TOC consult.

## 2022-01-21 NOTE — Assessment & Plan Note (Addendum)
Lipid profile with LDL 116 and HDL 72  Continue statin therapy with atorvastatin.

## 2022-01-21 NOTE — Assessment & Plan Note (Addendum)
MRI positive acute ischemic stroke left frontal lobe.   Patient was admitted to the telemetry [facility] and had frequent neuro checks.  Her neuro deficit improved during her hospitalization.   LDL elevated.  Her blood pressure remained stable. Echocardiogram with preserved LV systolic function and no signs of embolic phenomena. No valvular disease. Negative for intracardiac shunt.   Patient was consulted with Neurology with recommendations to continue with dual antiplatelet therapy with aspirin and clopidogrel for 3 weeks and then continue with aspirin alone.  Change statin to atorvastatin and arrange for outpatient cardiac event monitor to screen for paroxysmal atrial fibrillation.

## 2022-01-21 NOTE — Progress Notes (Signed)
SLP Cancellation Note  Patient Details Name: Jacqueline Leon MRN: [id number] DOB: Dec 07, 1955   Cancelled treatment:       Reason Eval/Treat Not Completed: SLP screened, no needs identified, will sign off; SLP screened Pt in room. Pt/family denies any changes in swallowing, speech, language, or cognition. SLE will be deferred at this time. Reconsult if indicated. SLP will sign off.   Thank you,  Havery Moros, CCC-SLP [phone]  [name] 01/21/2022, 1:39 PM

## 2022-01-22 ENCOUNTER — Telehealth: Payer: Self-pay | Admitting: *Deleted

## 2022-01-22 DIAGNOSIS — I639 Cerebral infarction, unspecified: Secondary | ICD-10-CM

## 2022-01-22 DIAGNOSIS — I10 Essential (primary) hypertension: Secondary | ICD-10-CM | POA: Diagnosis not present

## 2022-01-22 DIAGNOSIS — E782 Mixed hyperlipidemia: Secondary | ICD-10-CM | POA: Diagnosis not present

## 2022-01-22 MED ORDER — ATORVASTATIN CALCIUM 40 MG PO TABS: 40.0000 mg | ORAL_TABLET | Freq: Every day | ORAL | 0 refills | Status: DC

## 2022-01-22 MED ORDER — ASPIRIN 81 MG PO CHEW
81.0000 mg | CHEWABLE_TABLET | Freq: Every day | ORAL | 0 refills | Status: AC
Start: 2022-01-23 — End: 2022-02-22

## 2022-01-22 MED ORDER — ATORVASTATIN CALCIUM 40 MG PO TABS
40.0000 mg | ORAL_TABLET | Freq: Every day | ORAL | Status: DC
  Filled 2022-01-22: qty 1

## 2022-01-22 MED ORDER — CLOPIDOGREL BISULFATE 75 MG PO TABS: 75.0000 mg | ORAL_TABLET | Freq: Every day | ORAL | 0 refills | Status: AC

## 2022-01-22 MED ORDER — CLOPIDOGREL BISULFATE 75 MG PO TABS
75.0000 mg | ORAL_TABLET | Freq: Every day | ORAL | Status: DC
  Administered 2022-01-22: 75 mg via ORAL
  Filled 2022-01-22: qty 1

## 2022-01-22 NOTE — Telephone Encounter (Signed)
Pt enrolled in preventice for 30 day event monitor per staff msg.

## 2022-01-22 NOTE — Consult Note (Signed)
I connected with  Jacqueline Leon on 01/22/22 by a video enabled telemedicine application and verified that I am speaking with the correct person using two identifiers.   I discussed the limitations of evaluation and management by telemedicine. The patient expressed understanding and agreed to proceed.  Location of patient: [facility] Location of physician: [facility]    Neurology Consultation Reason for Consult: Acute stroke Referring Physician: Dr Westley Foots  CC: Right-sided weakness  History is obtained from: Patient, chart review  HPI: Jacqueline Leon is a 66 y.o. female with past medical history of hypertension, hyperlipidemia, arthritis who presented with sudden onset of right arm "heaviness".  States she was doing laundry at around 3 PM yesterday and suddenly her right arm went heavy like "dead weight" symptoms lasted for about an hour hour and a half and then completely resolved.  Has not recurred since.  Denies any similar symptoms in the past.  Event happened at home Last known normal 1500 01/20/2022 No tPA as NIHSS 0 No thrombectomy as no large vessel occlusion mRS 0  ROS: All other systems reviewed and negative except as noted in the HPI.   Past Medical History:  Diagnosis Date   Abnormal Pap smear    History of cervical cancer    Hyperlipidemia    Hypertension    Inner ear inflammation    Vaginal Pap smear, abnormal     Family History  Problem Relation Age of Onset   Hypertension Mother    Other Mother        passed away from childbirth   Hypertension Father    Hypertension Maternal Grandfather    Other Brother        had a pacemaker   Kidney disease Brother    Other Daughter        on dialysis   Social History:  reports that she quit smoking about 15 years ago. Her smoking use included cigarettes. She has never used smokeless tobacco. She reports current alcohol use. She reports that she does not use drugs.   Medications Prior to  Admission  Medication Sig Dispense Refill Last Dose   cholecalciferol (VITAMIN D) 1000 UNITS tablet Take 1,000 Units by mouth daily.   01/19/2022   hydrochlorothiazide (MICROZIDE) 12.5 MG capsule Take 1 capsule (12.5 mg total) by mouth daily. 90 capsule 4 01/19/2022   meclizine (ANTIVERT) 25 MG tablet Take 25 mg by mouth daily as needed for dizziness.   Past Month   Multiple Vitamin (MULTIVITAMIN) tablet Take 1 tablet by mouth daily.   01/19/2022   naproxen sodium (ALEVE) 220 MG tablet Take 440 mg by mouth daily as needed (pain).   unknown   simvastatin (ZOCOR) 20 MG tablet Take 1 daily (Patient taking differently: Take 20 mg by mouth daily. Take 1 daily) 90 tablet 4 01/19/2022      Exam: Current vital signs: BP 138/67 (BP Location: Right Arm)   Pulse 74   Temp 97.8 F (36.6 C)   Resp 19   Ht 5\' 4"  (1.626 m)   Wt 83.9 kg   SpO2 99%   BMI 31.76 kg/m  Vital signs in last 24 hours: Temp:  [97.8 F (36.6 C)-98.6 F (37 C)] 97.8 F (36.6 C) (06/22 0500) Pulse Rate:  [72-75] 74 (06/22 0500) Resp:  [19-20] 19 (06/22 0500) BP: (122-139)/(67-72) 138/67 (06/22 0500) SpO2:  [92 %-99 %] 99 % (06/22 0500)   Physical Exam  Constitutional: Appears well-developed and well-nourished.  Psych: Affect appropriate to situation Eyes: No scleral injection Neuro: AOx3, cranial nerves II to XII grossly intact, antigravity strength in all 4 extremities, FTN intact bilaterally  NIHSS 0  I have reviewed labs in epic and the results pertinent to this consultation are: CBC:  Recent Labs  Lab 01/20/22 2006 01/21/22 0427  WBC 9.7 7.3  NEUTROABS 6.5  --   HGB 14.7 13.3  HCT 44.9 41.0  MCV 93.0 93.6  PLT 232 207    Basic Metabolic Panel:  Lab Results  Component Value Date   NA 139 01/21/2022   K 3.8 01/21/2022   CO2 26 01/21/2022   GLUCOSE 86 01/21/2022   BUN 15 01/21/2022   CREATININE 0.77 01/21/2022   CALCIUM 9.1 01/21/2022   GFRNONAA >60 01/21/2022   GFRAA 73 08/02/2019   Lipid  Panel:  Lab Results  Component Value Date   LDLCALC 116 (H) 01/20/2022   HgbA1c:  Lab Results  Component Value Date   HGBA1C 5.2 01/20/2022   Urine Drug Screen:     Component Value Date/Time   LABOPIA NONE DETECTED 01/21/2022 0644   COCAINSCRNUR NONE DETECTED 01/21/2022 0644   LABBENZ NONE DETECTED 01/21/2022 0644   AMPHETMU NONE DETECTED 01/21/2022 0644   THCU NONE DETECTED 01/21/2022 0644   LABBARB NONE DETECTED 01/21/2022 0644    Alcohol Level     Component Value Date/Time   ETH <10 01/20/2022 2006     I have reviewed the images obtained:  CT head without contrast 01/20/2022: No acute intracranial abnormality MRI brain without contrast 01/21/2022: Small acute infarct in the posterior left frontal lobe. Moderate chronic small vessel ischemic disease. MR angio head and neck without contrast 01/21/2022: No evidence of blood vessel occlusion, stenosis TTE 01/21/2022: LVEF 60 to 65%, no regional wall motion abnormalities, no thrombus, no PFO    ASSESSMENT/PLAN: 66 year old female with hypertension, hyperlipidemia who presented with transient right-sided paresthesias and was noted to have acute ischemic stroke in left frontal lobe.  Acute ischemic stroke, left frontal lobe Hypertension Hyperlipidemia -Etiology: Suspected embolic  -LDL 116, A1c 5.2  Recommendations: -Recommend aspirin 81 mg daily and Plavix 75 mg daily for 3 weeks followed by aspirin 81 mg daily -Recommend switching simvastatin to atorvastatin 40 mg daily -Recommend 30-day event monitor at the time of discharge to look for paroxysmal A-fib -Blood pressure: Normotension - PT/OT -Modification of stroke risk factors -Stroke education including BEFAST  Thank you for allowing [place] to participate in the care of this patient. If you have any further questions, please contact  me or neurohospitalist.   Lindie Spruce Epilepsy [facility] neuro[facility]ist

## 2022-01-26 ENCOUNTER — Other Ambulatory Visit (HOSPITAL_COMMUNITY): Payer: Self-pay | Admitting: Family Medicine

## 2022-01-26 DIAGNOSIS — Z1231 Encounter for screening mammogram for malignant neoplasm of breast: Secondary | ICD-10-CM

## 2022-01-29 DIAGNOSIS — I639 Cerebral infarction, unspecified: Secondary | ICD-10-CM | POA: Diagnosis not present

## 2022-01-29 DIAGNOSIS — I4729 Other ventricular tachycardia: Secondary | ICD-10-CM | POA: Diagnosis not present

## 2022-01-30 ENCOUNTER — Ambulatory Visit (INDEPENDENT_AMBULATORY_CARE_PROVIDER_SITE_OTHER): Payer: Medicare Other

## 2022-01-30 DIAGNOSIS — G464 Cerebellar stroke syndrome: Secondary | ICD-10-CM | POA: Diagnosis not present

## 2022-02-02 DIAGNOSIS — G459 Transient cerebral ischemic attack, unspecified: Secondary | ICD-10-CM | POA: Diagnosis not present

## 2022-02-02 DIAGNOSIS — I639 Cerebral infarction, unspecified: Secondary | ICD-10-CM | POA: Diagnosis not present

## 2022-03-03 ENCOUNTER — Encounter: Payer: Self-pay | Admitting: Adult Health

## 2022-03-03 ENCOUNTER — Other Ambulatory Visit (HOSPITAL_COMMUNITY)
Admission: RE | Admit: 2022-03-03 | Discharge: 2022-03-03 | Disposition: A | Discharge status: Home / Self Care | Payer: Medicare Other | Source: Ambulatory Visit | Attending: Adult Health | Admitting: Adult Health

## 2022-03-03 ENCOUNTER — Ambulatory Visit (INDEPENDENT_AMBULATORY_CARE_PROVIDER_SITE_OTHER): Payer: Medicare Other | Admitting: Adult Health

## 2022-03-03 VITALS — BP 132/74 | HR 68 | Ht 63.0 in | Wt 191.0 lb

## 2022-03-03 DIAGNOSIS — Z01419 Encounter for gynecological examination (general) (routine) without abnormal findings: Secondary | ICD-10-CM | POA: Insufficient documentation

## 2022-03-03 DIAGNOSIS — I10 Essential (primary) hypertension: Secondary | ICD-10-CM | POA: Diagnosis not present

## 2022-03-03 DIAGNOSIS — Z1272 Encounter for screening for malignant neoplasm of vagina: Secondary | ICD-10-CM | POA: Insufficient documentation

## 2022-03-03 DIAGNOSIS — Z1151 Encounter for screening for human papillomavirus (HPV): Secondary | ICD-10-CM | POA: Insufficient documentation

## 2022-03-03 DIAGNOSIS — Z90710 Acquired absence of both cervix and uterus: Secondary | ICD-10-CM | POA: Diagnosis not present

## 2022-03-03 DIAGNOSIS — Z1211 Encounter for screening for malignant neoplasm of colon: Secondary | ICD-10-CM

## 2022-03-03 DIAGNOSIS — Z8673 Personal history of transient ischemic attack (TIA), and cerebral infarction without residual deficits: Secondary | ICD-10-CM

## 2022-03-03 DIAGNOSIS — Z8541 Personal history of malignant neoplasm of cervix uteri: Secondary | ICD-10-CM

## 2022-03-03 LAB — HEMOCCULT GUIAC POC 1CARD (OFFICE): Fecal Occult Blood, POC: NEGATIVE

## 2022-03-03 NOTE — Addendum Note (Signed)
Addended by: Colen Darling on: 03/03/2022 12:34 PM   Modules accepted: Orders

## 2022-03-03 NOTE — Progress Notes (Signed)
Patient ID: Jacqueline Leon, female   DOB: [birth year], 66 y.o.   MRN: [id number] History of Present Illness: Jacqueline Leon is a 66 year old black female,married sp hysterectomy for cervical cancer in for a well woman gyn exam and pap. She had a stroke in June, was going laundry and right arm felt like dead weight, but recovered quickly, but she went to ER ans was admitted, she has worn heart monitor till 02/27/22. Doing great now. PCP is Dr Phillips Odor   Current Medications, Allergies, Past Medical History, Past Surgical History, Family History and Social History were reviewed in [employer] electronic medical record.     Review of Systems: Patient denies any headaches, hearing loss, fatigue, blurred vision, shortness of breath, chest pain, abdominal pain, problems with bowel movements, urination, or intercourse. No joint pain or mood swings.     Physical Exam:BP 132/74 (BP Location: Left Arm, Patient Position: Sitting, Cuff Size: Normal)   Pulse 68   Ht 5\' 3"  (1.6 m)   Wt 191 lb (86.6 kg)   BMI 33.83 kg/m   General:  Well developed, well nourished, no acute distress Skin:  Warm and dry Neck:  Midline trachea, normal thyroid, good ROM, no lymphadenopathy,no carotid bruits heard Lungs; Clear to auscultation bilaterally Breast:  No dominant palpable mass, retraction, or nipple discharge Cardiovascular: Regular rate and rhythm Abdomen:  Soft, non tender, no hepatosplenomegaly Pelvic:  External genitalia is normal in appearance, no lesions.  The vagina is pale pink. Vaginal pap obtained, with HR HPV genotyping.Urethra has no lesions or masses. The cervix and uterus are absent. No adnexal masses or tenderness noted.Bladder is non tender, no masses felt. Rectal: Good sphincter tone, no polyps, or hemorrhoids felt.  Hemoccult negative. Extremities/musculoskeletal:  No swelling or varicosities noted, no clubbing or cyanosis Psych:  No mood changes, alert and cooperative,seems happy AA is 3 Fall risk  is low    03/03/2022   11:44 AM 10/21/2020    2:10 PM 08/02/2019    9:43 AM  Depression screen PHQ 2/9  Decreased Interest 0 0 0  Down, Depressed, Hopeless 0 0 0  PHQ - 2 Score 0 0 0  Altered sleeping 0 0   Tired, decreased energy 0 0   Change in appetite 0 0   Feeling bad or failure about yourself  0 0   Trouble concentrating 0 0   Moving slowly or fidgety/restless 0 0   Suicidal thoughts 0 0   PHQ-9 Score 0 0        03/03/2022   11:44 AM 10/21/2020    2:17 PM  GAD 7 : Generalized Anxiety Score  Nervous, Anxious, on Edge 0 0  Control/stop worrying 0 0  Worry too much - different things 0 0  Trouble relaxing 0 0  Restless 0 0  Easily annoyed or irritable 0 0  Afraid - awful might happen 0 0  Total GAD 7 Score 0 0      Upstream - 03/03/22 1139       Pregnancy Intention Screening   Does the patient want to become pregnant in the next year? N/A    Does the patient's partner want to become pregnant in the next year? N/A    Would the patient like to discuss contraceptive options today? N/A      Contraception Wrap Up   Current Method Female Sterilization   hyst   End Method Female Sterilization   hyst   Contraception Counseling Provided No  Examination chaperoned by Malachy Mood LPN  Impression and Plan: 1. Encounter for well woman exam with routine gynecological exam Physical in  1 year Labs with PCP  Mammogram tomorrow Pap sent  2. Encounter for screening fecal occult blood testing Hemoccult was negative   3. Essential hypertension On Microzide 12.5 mg daily, PCP refilled  4. Smear, vaginal, as part of routine gynecological examination Pap sent   5. S/P hysterectomy  6. History of CVA (cerebrovascular accident) Doing well no residual  7. History of cervical cancer

## 2022-03-04 ENCOUNTER — Ambulatory Visit (HOSPITAL_COMMUNITY)
Admission: RE | Admit: 2022-03-04 | Discharge: 2022-03-04 | Disposition: A | Discharge status: Home / Self Care | Payer: Medicare Other | Source: Ambulatory Visit | Attending: Family Medicine | Admitting: Family Medicine

## 2022-03-04 DIAGNOSIS — Z1231 Encounter for screening mammogram for malignant neoplasm of breast: Secondary | ICD-10-CM | POA: Insufficient documentation

## 2022-03-05 LAB — CYTOLOGY - PAP
Comment: NEGATIVE
Diagnosis: NEGATIVE
High risk HPV: NEGATIVE

## 2022-04-17 DIAGNOSIS — I10 Essential (primary) hypertension: Secondary | ICD-10-CM | POA: Diagnosis not present

## 2022-04-17 DIAGNOSIS — E7849 Other hyperlipidemia: Secondary | ICD-10-CM | POA: Diagnosis not present

## 2022-04-17 DIAGNOSIS — E782 Mixed hyperlipidemia: Secondary | ICD-10-CM | POA: Diagnosis not present

## 2022-04-17 DIAGNOSIS — I639 Cerebral infarction, unspecified: Secondary | ICD-10-CM | POA: Diagnosis not present

## 2022-06-09 DIAGNOSIS — E782 Mixed hyperlipidemia: Secondary | ICD-10-CM | POA: Diagnosis not present

## 2022-06-09 DIAGNOSIS — I639 Cerebral infarction, unspecified: Secondary | ICD-10-CM | POA: Diagnosis not present

## 2022-06-09 DIAGNOSIS — M25511 Pain in right shoulder: Secondary | ICD-10-CM | POA: Diagnosis not present

## 2022-06-09 DIAGNOSIS — Z0001 Encounter for general adult medical examination with abnormal findings: Secondary | ICD-10-CM | POA: Diagnosis not present

## 2022-07-06 ENCOUNTER — Encounter: Payer: Self-pay | Admitting: Orthopedic Surgery

## 2022-07-06 ENCOUNTER — Ambulatory Visit (INDEPENDENT_AMBULATORY_CARE_PROVIDER_SITE_OTHER): Payer: Medicare Other | Admitting: Orthopedic Surgery

## 2022-07-06 VITALS — BP 144/82 | HR 107 | Ht 64.0 in | Wt 186.6 lb

## 2022-07-06 DIAGNOSIS — M25811 Other specified joint disorders, right shoulder: Secondary | ICD-10-CM

## 2022-07-06 MED ORDER — MELOXICAM 7.5 MG PO TABS: 7.5000 mg | ORAL_TABLET | Freq: Every day | ORAL | 1 refills | Status: DC

## 2022-07-06 NOTE — Progress Notes (Signed)
Chief Complaint  Patient presents with   New Patient (Initial Visit)   Shoulder Pain    RT/ referred by Dr.John Phillips Odor Painful off and on ~6 mths Xray at [facility] in June   New patient evaluation  This is a 66 year old female with a 7-month history of intermittent pain in her right shoulder.  She denies any trauma.  She complains of a dull aching pain over the right shoulder with occasional periods of loss of motion and painful forward elevation  Review of systems there is no evidence of neck pain numbness or tingling in the arm  Past Medical History:  Diagnosis Date   Abnormal Pap smear    History of cervical cancer    Hyperlipidemia    Hypertension    Inner ear inflammation    Mini stroke 01/2022   Vaginal Pap smear, abnormal     No Known Allergies  BP (!) 144/82   Pulse (!) 107   Ht 5\' 4"  (1.626 m)   Wt 186 lb 9.6 oz (84.6 kg)   BMI 32.03 kg/m   Physical Exam Musculoskeletal:     Right shoulder: No swelling, deformity, effusion, laceration, tenderness, bony tenderness or crepitus. Decreased range of motion. Normal strength. Normal pulse.     Left shoulder: Normal. No tenderness or crepitus. Normal range of motion. Normal strength. Normal pulse.     Comments: Empty can sign was normal.  There was impingement at 120 degrees  There was no weakness in the rotator cuff    Outside imaging included 3 views of the left shoulder is no fracture dislocation humeral head or glenoid abnormalities  Encounter Diagnosis  Name Primary?   Shoulder impingement, right Yes    We discussed the possibility of injection versus medication the patient opted for medication  So we will start an anti-inflammatory she has no allergies  Meds ordered this encounter  Medications   meloxicam (MOBIC) 7.5 MG tablet    Sig: Take 1 tablet (7.5 mg total) by mouth daily.    Dispense:  90 tablet    Refill:  1    If she does not improve after 6 weeks we will try the injection  We also recommend  a gentle range of motion exercise program at home  (Acute uncomplicated, outside imaging reviewed, prescription management)

## 2022-07-06 NOTE — Patient Instructions (Signed)
Do exercises and take the medication for 6 weeks

## 2022-08-20 ENCOUNTER — Ambulatory Visit: Payer: Medicare Other | Admitting: Orthopedic Surgery

## 2022-08-20 ENCOUNTER — Encounter: Payer: Self-pay | Admitting: Orthopedic Surgery

## 2022-08-20 DIAGNOSIS — M25811 Other specified joint disorders, right shoulder: Secondary | ICD-10-CM | POA: Diagnosis not present

## 2022-08-20 NOTE — Progress Notes (Signed)
FOLLOW UP   Encounter Diagnosis  Name Primary?   Shoulder impingement, right Yes    Chief Complaint  Patient presents with   Shoulder Problem    Right shld doing well, con't HEP, NSAID.    PRIOR TREATMENT: NSAIDS MELOXICAM AND HEP   SHE HAS IMPROVED SIGNIFICANTLY   SHE HAS REGAINED FROM IN THE RIGHT SHOULDER   I RELEASED HER BUT TOLD HER TO CONTINUE THE MELOXICAM

## 2022-09-13 ENCOUNTER — Other Ambulatory Visit: Payer: Self-pay | Admitting: Orthopedic Surgery

## 2022-10-01 ENCOUNTER — Encounter: Payer: Self-pay | Admitting: Radiology

## 2022-10-14 ENCOUNTER — Emergency Department (HOSPITAL_COMMUNITY): Payer: Medicare Other

## 2022-10-14 ENCOUNTER — Other Ambulatory Visit: Payer: Self-pay

## 2022-10-14 ENCOUNTER — Inpatient Hospital Stay (HOSPITAL_COMMUNITY)
Admission: EM | Admit: 2022-10-14 | Discharge: 2022-10-16 | DRG: 065 | Disposition: A | Discharge status: Home Health | Payer: Medicare Other | Attending: Family Medicine | Admitting: Family Medicine

## 2022-10-14 ENCOUNTER — Encounter (HOSPITAL_COMMUNITY): Payer: Self-pay | Admitting: Radiology

## 2022-10-14 DIAGNOSIS — Z90710 Acquired absence of both cervix and uterus: Secondary | ICD-10-CM | POA: Diagnosis not present

## 2022-10-14 DIAGNOSIS — I6523 Occlusion and stenosis of bilateral carotid arteries: Secondary | ICD-10-CM | POA: Diagnosis not present

## 2022-10-14 DIAGNOSIS — Z841 Family history of disorders of kidney and ureter: Secondary | ICD-10-CM | POA: Diagnosis not present

## 2022-10-14 DIAGNOSIS — S9304XA Dislocation of right ankle joint, initial encounter: Secondary | ICD-10-CM

## 2022-10-14 DIAGNOSIS — R2990 Unspecified symptoms and signs involving the nervous system: Secondary | ICD-10-CM

## 2022-10-14 DIAGNOSIS — E785 Hyperlipidemia, unspecified: Secondary | ICD-10-CM | POA: Diagnosis present

## 2022-10-14 DIAGNOSIS — S82891A Other fracture of right lower leg, initial encounter for closed fracture: Secondary | ICD-10-CM

## 2022-10-14 DIAGNOSIS — R29702 NIHSS score 2: Secondary | ICD-10-CM | POA: Diagnosis present

## 2022-10-14 DIAGNOSIS — Y92009 Unspecified place in unspecified non-institutional (private) residence as the place of occurrence of the external cause: Secondary | ICD-10-CM

## 2022-10-14 DIAGNOSIS — Y92008 Other place in unspecified non-institutional (private) residence as the place of occurrence of the external cause: Secondary | ICD-10-CM | POA: Diagnosis not present

## 2022-10-14 DIAGNOSIS — R29810 Facial weakness: Secondary | ICD-10-CM | POA: Diagnosis present

## 2022-10-14 DIAGNOSIS — W1830XA Fall on same level, unspecified, initial encounter: Secondary | ICD-10-CM | POA: Diagnosis present

## 2022-10-14 DIAGNOSIS — E876 Hypokalemia: Secondary | ICD-10-CM | POA: Diagnosis present

## 2022-10-14 DIAGNOSIS — Z8673 Personal history of transient ischemic attack (TIA), and cerebral infarction without residual deficits: Secondary | ICD-10-CM

## 2022-10-14 DIAGNOSIS — W19XXXA Unspecified fall, initial encounter: Secondary | ICD-10-CM

## 2022-10-14 DIAGNOSIS — S82851A Displaced trimalleolar fracture of right lower leg, initial encounter for closed fracture: Secondary | ICD-10-CM | POA: Diagnosis not present

## 2022-10-14 DIAGNOSIS — Z743 Need for continuous supervision: Secondary | ICD-10-CM | POA: Diagnosis not present

## 2022-10-14 DIAGNOSIS — G8191 Hemiplegia, unspecified affecting right dominant side: Secondary | ICD-10-CM | POA: Diagnosis present

## 2022-10-14 DIAGNOSIS — I639 Cerebral infarction, unspecified: Secondary | ICD-10-CM | POA: Diagnosis not present

## 2022-10-14 DIAGNOSIS — I10 Essential (primary) hypertension: Secondary | ICD-10-CM | POA: Diagnosis present

## 2022-10-14 DIAGNOSIS — Z87891 Personal history of nicotine dependence: Secondary | ICD-10-CM | POA: Diagnosis not present

## 2022-10-14 DIAGNOSIS — S82831A Other fracture of upper and lower end of right fibula, initial encounter for closed fracture: Secondary | ICD-10-CM | POA: Diagnosis not present

## 2022-10-14 DIAGNOSIS — Z79899 Other long term (current) drug therapy: Secondary | ICD-10-CM

## 2022-10-14 DIAGNOSIS — Z7982 Long term (current) use of aspirin: Secondary | ICD-10-CM | POA: Diagnosis not present

## 2022-10-14 DIAGNOSIS — J9811 Atelectasis: Secondary | ICD-10-CM | POA: Diagnosis not present

## 2022-10-14 DIAGNOSIS — R531 Weakness: Secondary | ICD-10-CM | POA: Diagnosis present

## 2022-10-14 DIAGNOSIS — I493 Ventricular premature depolarization: Secondary | ICD-10-CM | POA: Diagnosis present

## 2022-10-14 DIAGNOSIS — S82451A Displaced comminuted fracture of shaft of right fibula, initial encounter for closed fracture: Secondary | ICD-10-CM | POA: Diagnosis present

## 2022-10-14 DIAGNOSIS — I63522 Cerebral infarction due to unspecified occlusion or stenosis of left anterior cerebral artery: Principal | ICD-10-CM | POA: Diagnosis present

## 2022-10-14 DIAGNOSIS — Z8249 Family history of ischemic heart disease and other diseases of the circulatory system: Secondary | ICD-10-CM

## 2022-10-14 DIAGNOSIS — R6889 Other general symptoms and signs: Secondary | ICD-10-CM | POA: Diagnosis not present

## 2022-10-14 DIAGNOSIS — R4701 Aphasia: Secondary | ICD-10-CM | POA: Diagnosis present

## 2022-10-14 DIAGNOSIS — R29818 Other symptoms and signs involving the nervous system: Secondary | ICD-10-CM | POA: Diagnosis not present

## 2022-10-14 DIAGNOSIS — I6389 Other cerebral infarction: Secondary | ICD-10-CM | POA: Diagnosis not present

## 2022-10-14 DIAGNOSIS — G459 Transient cerebral ischemic attack, unspecified: Secondary | ICD-10-CM | POA: Diagnosis not present

## 2022-10-14 DIAGNOSIS — Z8541 Personal history of malignant neoplasm of cervix uteri: Secondary | ICD-10-CM

## 2022-10-14 LAB — DIFFERENTIAL
Abs Immature Granulocytes: 0.02 10*3/uL (ref 0.00–0.07)
Basophils Absolute: 0.1 10*3/uL (ref 0.0–0.1)
Basophils Relative: 1 %
Eosinophils Absolute: 0.2 10*3/uL (ref 0.0–0.5)
Eosinophils Relative: 2 %
Immature Granulocytes: 0 %
Lymphocytes Relative: 37 %
Lymphs Abs: 3.1 10*3/uL (ref 0.7–4.0)
Monocytes Absolute: 0.8 10*3/uL (ref 0.1–1.0)
Monocytes Relative: 9 %
Neutro Abs: 4.3 10*3/uL (ref 1.7–7.7)
Neutrophils Relative %: 51 %

## 2022-10-14 LAB — TROPONIN I (HIGH SENSITIVITY)
Troponin I (High Sensitivity): 4 ng/L (ref <18)
Troponin I (High Sensitivity): 7 ng/L (ref <18)

## 2022-10-14 LAB — COMPREHENSIVE METABOLIC PANEL
ALT: 32 U/L (ref 0–44)
AST: 44 U/L — ABNORMAL HIGH (ref 15–41)
Albumin: 3.8 g/dL (ref 3.5–5.0)
Alkaline Phosphatase: 64 U/L (ref 38–126)
Anion gap: 12 (ref 5–15)
BUN: 20 mg/dL (ref 8–23)
CO2: 23 mmol/L (ref 22–32)
Calcium: 8.7 mg/dL — ABNORMAL LOW (ref 8.9–10.3)
Chloride: 104 mmol/L (ref 98–111)
Creatinine, Ser: 0.95 mg/dL (ref 0.44–1.00)
GFR, Estimated: >60 mL/min (ref >60)
Glucose, Bld: 104 mg/dL — ABNORMAL HIGH (ref 70–99)
Potassium: 3.3 mmol/L — ABNORMAL LOW (ref 3.5–5.1)
Sodium: 139 mmol/L (ref 135–145)
Total Bilirubin: 1 mg/dL (ref 0.3–1.2)
Total Protein: 7.8 g/dL (ref 6.5–8.1)

## 2022-10-14 LAB — I-STAT CHEM 8, ED
BUN: 22 mg/dL (ref 8–23)
Calcium, Ion: 1.04 mmol/L — ABNORMAL LOW (ref 1.15–1.40)
Chloride: 104 mmol/L (ref 98–111)
Creatinine, Ser: 0.8 mg/dL (ref 0.44–1.00)
Glucose, Bld: 103 mg/dL — ABNORMAL HIGH (ref 70–99)
HCT: 45 % (ref 36.0–46.0)
Hemoglobin: 15.3 g/dL — ABNORMAL HIGH (ref 12.0–15.0)
Potassium: 3.3 mmol/L — ABNORMAL LOW (ref 3.5–5.1)
Sodium: 142 mmol/L (ref 135–145)
TCO2: 25 mmol/L (ref 22–32)

## 2022-10-14 LAB — CBC
HCT: 43.8 % (ref 36.0–46.0)
Hemoglobin: 14.3 g/dL (ref 12.0–15.0)
MCH: 30.3 pg (ref 26.0–34.0)
MCHC: 32.6 g/dL (ref 30.0–36.0)
MCV: 92.8 fL (ref 80.0–100.0)
Platelets: 284 10*3/uL (ref 150–400)
RBC: 4.72 MIL/uL (ref 3.87–5.11)
RDW: 13.5 % (ref 11.5–15.5)
WBC: 8.4 10*3/uL (ref 4.0–10.5)
nRBC: 0 % (ref 0.0–0.2)

## 2022-10-14 LAB — MAGNESIUM: Magnesium: 2 mg/dL (ref 1.7–2.4)

## 2022-10-14 LAB — PROTIME-INR
INR: 1 (ref 0.8–1.2)
Prothrombin Time: 12.7 seconds (ref 11.4–15.2)

## 2022-10-14 LAB — CBG MONITORING, ED: Glucose-Capillary: 116 mg/dL — ABNORMAL HIGH (ref 70–99)

## 2022-10-14 LAB — APTT: aPTT: 25 seconds (ref 24–36)

## 2022-10-14 LAB — ETHANOL: Alcohol, Ethyl (B): <10 mg/dL (ref <10)

## 2022-10-14 MED ORDER — ASPIRIN 300 MG RE SUPP
300.0000 mg | Freq: Every day | RECTAL | Status: DC
  Filled 2022-10-14: qty 1

## 2022-10-14 MED ORDER — ACETAMINOPHEN 160 MG/5ML PO SOLN: 650.0000 mg | ORAL | Status: DC | PRN

## 2022-10-14 MED ORDER — IOHEXOL 350 MG/ML SOLN
75.0000 mL | Freq: Once | INTRAVENOUS | Status: AC | PRN
  Administered 2022-10-14: 75 mL via INTRAVENOUS

## 2022-10-14 MED ORDER — PROPOFOL 10 MG/ML IV BOLUS
INTRAVENOUS | Status: AC
  Filled 2022-10-14: qty 20

## 2022-10-14 MED ORDER — ETOMIDATE 2 MG/ML IV SOLN
10.0000 mg | Freq: Once | INTRAVENOUS | Status: DC
  Filled 2022-10-14: qty 10

## 2022-10-14 MED ORDER — ETOMIDATE 2 MG/ML IV SOLN
INTRAVENOUS | Status: AC | PRN
  Administered 2022-10-14: 10 mg via INTRAVENOUS

## 2022-10-14 MED ORDER — ATORVASTATIN CALCIUM 40 MG PO TABS
40.0000 mg | ORAL_TABLET | Freq: Every day | ORAL | Status: DC
  Administered 2022-10-14 – 2022-10-16 (×3): 40 mg via ORAL
  Filled 2022-10-14 (×3): qty 1

## 2022-10-14 MED ORDER — ACETAMINOPHEN 650 MG RE SUPP: 650.0000 mg | RECTAL | Status: DC | PRN

## 2022-10-14 MED ORDER — ACETAMINOPHEN 325 MG PO TABS: 650.0000 mg | ORAL_TABLET | ORAL | Status: DC | PRN

## 2022-10-14 MED ORDER — MIDAZOLAM HCL 5 MG/5ML IJ SOLN
INTRAMUSCULAR | Status: AC | PRN
  Administered 2022-10-14 (×2): .5 mg via INTRAVENOUS

## 2022-10-14 MED ORDER — OXYCODONE-ACETAMINOPHEN 5-325 MG PO TABS
1.0000 | ORAL_TABLET | ORAL | Status: DC | PRN
  Administered 2022-10-14 – 2022-10-16 (×6): 1 via ORAL
  Filled 2022-10-14 (×6): qty 1

## 2022-10-14 MED ORDER — ASPIRIN 325 MG PO TABS
325.0000 mg | ORAL_TABLET | Freq: Every day | ORAL | Status: DC
  Administered 2022-10-14 – 2022-10-15 (×2): 325 mg via ORAL
  Filled 2022-10-14 (×2): qty 1

## 2022-10-14 MED ORDER — MECLIZINE HCL 12.5 MG PO TABS: 25.0000 mg | ORAL_TABLET | Freq: Every day | ORAL | Status: DC | PRN

## 2022-10-14 MED ORDER — SENNOSIDES-DOCUSATE SODIUM 8.6-50 MG PO TABS: 1.0000 | ORAL_TABLET | Freq: Every evening | ORAL | Status: DC | PRN

## 2022-10-14 MED ORDER — ASPIRIN 81 MG PO TBEC: 81.0000 mg | DELAYED_RELEASE_TABLET | Freq: Every day | ORAL | Status: DC

## 2022-10-14 MED ORDER — STROKE: EARLY STAGES OF RECOVERY BOOK
Freq: Once | Status: AC
  Filled 2022-10-14: qty 1

## 2022-10-14 MED ORDER — SODIUM CHLORIDE 0.9 % IV SOLN: INTRAVENOUS | Status: DC

## 2022-10-14 MED ORDER — PROPOFOL 10 MG/ML IV BOLUS
INTRAVENOUS | Status: AC | PRN
  Administered 2022-10-14: 20 ug via INTRAVENOUS
  Administered 2022-10-14: 40 ug via INTRAVENOUS

## 2022-10-14 MED ORDER — MIDAZOLAM HCL 5 MG/5ML IJ SOLN
1.0000 mg | Freq: Once | INTRAMUSCULAR | Status: DC
  Filled 2022-10-14: qty 5

## 2022-10-14 MED ORDER — POTASSIUM CHLORIDE 10 MEQ/100ML IV SOLN
10.0000 meq | INTRAVENOUS | Status: AC
  Administered 2022-10-14 (×4): 10 meq via INTRAVENOUS
  Filled 2022-10-14 (×4): qty 100

## 2022-10-14 MED ORDER — ENOXAPARIN SODIUM 40 MG/0.4ML IJ SOSY
40.0000 mg | PREFILLED_SYRINGE | INTRAMUSCULAR | Status: DC
  Administered 2022-10-15: 40 mg via SUBCUTANEOUS
  Filled 2022-10-14: qty 0.4

## 2022-10-14 MED ORDER — CHLORHEXIDINE GLUCONATE CLOTH 2 % EX PADS
6.0000 | MEDICATED_PAD | Freq: Every day | CUTANEOUS | Status: DC
  Administered 2022-10-14 – 2022-10-16 (×3): 6 via TOPICAL

## 2022-10-14 NOTE — H&P (Signed)
History and Physical  Patient: Jacqueline Leon [id number] DOB: 1956-01-12 DOA: 10/14/2022 DOS: the patient was seen and examined on 10/14/2022 Patient coming from: Home  Chief Complaint: Unwitnessed fall and inability to speak HPI: Jacqueline Leon is a 67 y.o. female with PMH significant of HTN, HLD, recurrent dizziness reported secondary to inner ear issues, HLD.  Patient presented to the hospital after an unwitnessed event at home. Patient and her husband were talking in the kitchen.  Patient went upstairs.  Husband heard a thud and a few minutes later he heard a second thud.  He called for his wife and she did not respond so he went upstairs and found that she was on the ground sitting upright with inability to speak.  She appeared to have injured her right foot. 911 was called. Last known well was 1:15 PM. Presented as a code stroke in the ER.  Had some right-sided weakness as well. At the time of my evaluation patient does not have any headache.  Patient is able to speak and answer questions but appears to have difficulty finding her words. No tingling or numbness reported.  No chest pain.  No abdominal pain.  No dizziness or lightheadedness.  No nausea no vomiting.  No diarrhea.  No burning no fever no chills reported. No recent change in the medications reported. Patient has a history of recurrent dizziness spells which her PCP has diagnosed as inner ear issues and prescribed her meclizine for that. Last use was somewhere last month. No alcohol abuse no drug abuse.  No history of smoking. Neurology was consulted in the ED.  TNK was not administered as the symptoms were mild and rapidly improving.  Review of Systems: As mentioned in the history of present illness. All other systems reviewed and are negative.  Prior to Admission medications   Medication Sig Start Date End Date Taking? Authorizing Provider  aspirin EC 81 MG tablet Take 81 mg by mouth daily. Swallow whole.   Yes [provider]  atorvastatin (LIPITOR) 40 MG tablet Take 40 mg by mouth daily.   Yes [provider]  cholecalciferol (VITAMIN D) 1000 UNITS tablet Take 1,000 Units by mouth daily.   Yes [provider]  hydrochlorothiazide (MICROZIDE) 12.5 MG capsule Take 1 capsule (12.5 mg total) by mouth daily. 08/02/19  Yes Estill Dooms, NP  meclizine (ANTIVERT) 25 MG tablet Take 25 mg by mouth daily as needed for dizziness.   Yes [provider]  meloxicam (MOBIC) 7.5 MG tablet TAKE 1 TABLET BY MOUTH DAILY 09/14/22  Yes Carole Civil, MD  Multiple Vitamin (MULTIVITAMIN) tablet Take 1 tablet by mouth daily.   Yes [provider]    Past Medical History:  Diagnosis Date   Abnormal Pap smear    History of cervical cancer    Hyperlipidemia    Hypertension    Inner ear inflammation    Mini stroke 01/2022   Vaginal Pap smear, abnormal    Past Surgical History:  Procedure Laterality Date   ABDOMINAL HYSTERECTOMY     ORIF ANKLE FRACTURE Left 07/12/2017   Procedure: OPEN REDUCTION INTERNAL FIXATION (ORIF) ANKLE FRACTURE;  Surgeon: Carole Civil, MD;  Location: [facility];  Service: Orthopedics;  Laterality: Left;   Social History:  reports that she quit smoking about 16 years ago. Her smoking use included cigarettes. She has never used smokeless tobacco. She reports current alcohol use. She reports that she does not use drugs. No Known Allergies  Family History  Problem Relation Age of Onset   Hypertension Mother    Other Mother        passed away from childbirth   Hypertension Father    Hypertension Maternal Grandfather    Other Brother        had a pacemaker   Kidney disease Brother    Other Daughter        on dialysis   Physical Exam: Vitals:   10/14/22 1600 10/14/22 1600 10/14/22 1630 10/14/22 1700  BP: 130/70 130/70 139/71 131/80  Pulse: 66 70 66 85  Resp: '12 12 14 15  '$ SpO2: 100% 100% 100% 100%   General: Appear in mild distress; no  visible Abnormal Neck Mass Or lumps, Conjunctiva normal Cardiovascular: S1 and S2 Present, no Murmur, Respiratory: good respiratory effort, Bilateral Air entry present and CTA, no Crackles, no wheezes Abdomen: Bowel Sound present, Non tender  Extremities: no Pedal edema Neurology: alert and oriented to time, place, and person.  Speech clear but patient appears to have some expressive aphasia. Tongue midline.  Pupils are equal and reactive to light.  Extraocular muscle movement present. Bilateral upper extremity and lower extremity motor strength equal although patient is preferring to perform finger-nose-finger on her right side with minimal effort.  No dysmetria. Right ankle wrapped.  Reflexes are difficult to elicit. Gait not checked due to patient safety concerns   Data Reviewed: I have reviewed ED notes, Vitals, Lab results and outpatient records. Since last encounter, pertinent lab results CBC and BMP   . I have ordered test including CBC and BMP  . I have discussed pt's care plan and test results with ED provider  . I have ordered imaging echocardiogram  .  Reviewed EKG.  Frequent PVCs and trigeminy is noted.  Assessment and Plan Unwitnessed unresponsive event. Concern for acute CVA. Presented as a code stroke with expressive aphasia as well as right-sided weakness. CT of the head unremarkable. Last known well time 1:15 PM. TNK was not given as the patient was improving rapidly. At the time of my evaluation examination is continues to improve. MRI brain ordered. CTA negative for any large vessel occlusion. On aspirin 81 mg at home. Would be on aspirin and Plavix for 21 days followed by Plavix only pending findings of MRI. Passed swallowing evaluation. PT OT SLP consulted. Will check lipid profile as well as hemoglobin A1c.  History of dizziness. Will get PT evaluation for vestibular rehab. Her presentation could also be one of the spells but a more severe 1. MRI should  shed some more light if there is any intracranial abnormality causing recurrent vertigo.  Frequent PVCs/trigeminy's. Will get echocardiogram. Patient will most likely benefit from a longer-term cardiac monitoring given her presentation which appears more cardiovascular rather than neurological event.  Right ankle fracture. Currently reduced in the ER. EDP discussed with orthopedics Dr. Carlis Abbott.  No intervention recommended for now.  HLD. On Lipitor 40 at home.  For now we will continue. LDL pending.  Hypokalemia. Replacing. Monitor.  History of HTN. HCTZ. Currently on hold Giving IV fluids for now.  Advance Care Planning:   Code Status: Full Code  Consults: Neurology was consulted by ED Family Communication: Husband at bedside  Author: Berle Mull, MD 10/14/2022 6:08 PM For on call review [email or web address].

## 2022-10-14 NOTE — Consult Note (Addendum)
1423-Code stroke alerted-FANGD neg 1428-Dr Quinn Axe paged 1433-Dr Quinn Axe to camera-asked to assess pt in CT in case she needed a CTA 1444-No TNK administered d/t rapid improvement, getting CTA before returning to room

## 2022-10-14 NOTE — ED Triage Notes (Signed)
Pt brought in by [facility] for c/o code stroke and fall  Pt's spouse reported to ems pt was lkw at 1315 and pt fell  When ems arrived pt was found to have right side droop and unable to follow commands  Pt has obvious deformity to right ankle from fall

## 2022-10-14 NOTE — ED Notes (Signed)
Pt is back to normal after conscious sedation. Will start every 30 min checks now.

## 2022-10-14 NOTE — Consult Note (Signed)
NEUROLOGY TELECONSULTATION NOTE   Date of service: October 14, 2022 Patient Name: Jacqueline Leon MRN:  [id number] DOB:  07/12/1956 Reason for consult: telestroke  Requesting Provider: Dr. Isla Pence Consult Participants: myself, patient, bedside RN, telestroke RN Location of the provider: [facility] Location of the patient: [facility]  This consult was provided via telemedicine with 2-way video and audio communication. The patient/family was informed that care would be provided in this way and agreed to receive care in this manner.   _ _ _   _ __   _ __ _ _  __ __   _ __   __ _  History of Present Illness   This is a 67 year old woman with past medical history significant for hypertension and hyperlipidemia who was brought in by EMS after a fall and subsequent difficulty speaking.  Last known well was 1:15 PM after which time she was found to have a fall in the other room from a family member.  She was noted to have mild right-sided weakness at that time.  She was also aphasic and able to follow commands but was not able to speak.  Her symptoms rapidly improved and by the time of my telemetry examination she had a very mild expressive aphasia as well as minimal drift in her right leg.  She had no other focal deficits.  She was able to name 100% of objects tested, was able to read, and was able to repeat.  She follows multistep commands.  NIH stroke scale was 2.  CT head showed no acute process on personal review.  TNK was not administered due to mild and rapidly improving symptoms.  CTA was not performed as part of the stroke code due to exam not being consistent with LVO.   ROS   Per HPI; all other systems reviewed and are negative  Past History   The following was personally reviewed:  Past Medical History:  Diagnosis Date   Abnormal Pap smear    History of cervical cancer    Hyperlipidemia    Hypertension    Inner ear inflammation    Mini stroke 01/2022   Vaginal Pap smear, abnormal     Past Surgical History:  Procedure Laterality Date   ABDOMINAL HYSTERECTOMY     ORIF ANKLE FRACTURE Left 07/12/2017   Procedure: OPEN REDUCTION INTERNAL FIXATION (ORIF) ANKLE FRACTURE;  Surgeon: Carole Civil, MD;  Location: [facility];  Service: Orthopedics;  Laterality: Left;   Family History  Problem Relation Age of Onset   Hypertension Mother    Other Mother        passed away from childbirth   Hypertension Father    Hypertension Maternal Grandfather    Other Brother        had a pacemaker   Kidney disease Brother    Other Daughter        on dialysis   Social History   Socioeconomic History   Marital status: Married    Spouse name: Not on file   Number of children: Not on file   Years of education: Not on file   Highest education level: Not on file  Occupational History   Not on file  Tobacco Use   Smoking status: Former    Types: Cigarettes    Quit date: 08/03/2006    Years since quitting: 16.2   Smokeless tobacco: Never  Vaping Use   Vaping Use: Never used  Substance and Sexual Activity   Alcohol use: Yes  Comment: occassional   Drug use: No   Sexual activity: Yes    Birth control/protection: Surgical    Comment: hyst  Other Topics Concern   Not on file  Social History Narrative   Not on file   Social Determinants of Health   Financial Resource Strain: Low Risk  (03/03/2022)   Overall Financial Resource Strain (CARDIA)    Difficulty of Paying Living Expenses: Not hard at all  Food Insecurity: No Food Insecurity (03/03/2022)   Hunger Vital Sign    Worried About Running Out of Food in the Last Year: Never true    Ran Out of Food in the Last Year: Never true  Transportation Needs: No Transportation Needs (03/03/2022)   PRAPARE - Hydrologist (Medical): No    Lack of Transportation (Non-Medical): No  Physical Activity: Insufficiently Active (03/03/2022)   Exercise Vital Sign    Days of Exercise per Week: 2 days    Minutes of  Exercise per Session: 30 min  Stress: No Stress Concern Present (03/03/2022)   [place]    Feeling of Stress : Not at all  Social Connections: Moderately Integrated (03/03/2022)   Social Connection and Isolation Panel [NHANES]    Frequency of Communication with Friends and Family: More than three times a week    Frequency of Social Gatherings with Friends and Family: Three times a week    Attends Religious Services: More than 4 times per year    Active Member of Clubs or Organizations: No    Attends Archivist Meetings: Never    Marital Status: Married   No Known Allergies  Medications   (Not in a hospital admission)     Current Facility-Administered Medications:    etomidate (AMIDATE) injection 10 mg, 10 mg, Intravenous, Once, Isla Pence, MD   midazolam (VERSED) 5 MG/5ML injection 1 mg, 1 mg, Intravenous, Once, Isla Pence, MD  Current Outpatient Medications:    aspirin EC 81 MG tablet, Take 81 mg by mouth daily. Swallow whole., Disp: , Rfl:    atorvastatin (LIPITOR) 40 MG tablet, Take 40 mg by mouth daily., Disp: , Rfl:    cholecalciferol (VITAMIN D) 1000 UNITS tablet, Take 1,000 Units by mouth daily., Disp: , Rfl:    hydrochlorothiazide (MICROZIDE) 12.5 MG capsule, Take 1 capsule (12.5 mg total) by mouth daily., Disp: 90 capsule, Rfl: 4   meclizine (ANTIVERT) 25 MG tablet, Take 25 mg by mouth daily as needed for dizziness., Disp: , Rfl:    meloxicam (MOBIC) 7.5 MG tablet, TAKE 1 TABLET BY MOUTH DAILY, Disp: 100 tablet, Rfl: 2   Multiple Vitamin (MULTIVITAMIN) tablet, Take 1 tablet by mouth daily., Disp: , Rfl:   Vitals   Vitals:   10/14/22 1457  BP: (!) 150/73  Pulse: 87  Resp: 12  SpO2: 99%     There is no height or weight on file to calculate BMI.  Physical Exam   Exam performed over telemedicine with 2-way video and audio communication and with assistance of bedside RN  Physical  Exam Gen: A&O x4, NAD Resp: normal WOB CV: extremities appear well-perfused  Neuro: *MS: A&O x4. Follows multi-step commands.  *Speech: nondysarthric, mild expressive aphasia, able to name and repeat *CN: PERRL 32m, EOMI, VFF by confrontation, sensation intact, smile symmetric, hearing intact to voice *Motor:   Normal bulk.  No tremor, rigidity or bradykinesia. No pronator drift. Mild RLE drift, other extremities full strength *  Sensory: SILT. Symmetric. No double-simultaneous extinction.  *Coordination:  Finger-to-nose, heel-to-shin, rapid alternating motions were intact. *Reflexes:  UTA 2/2 tele-exam *Gait: deferred  NIHSS = 2 for language and RLE drift   Premorbid mRS = 1   Labs   CBC:  Recent Labs  Lab 10/14/22 1430 10/14/22 1433  WBC 8.4  --   NEUTROABS 4.3  --   HGB 14.3 15.3*  HCT 43.8 45.0  MCV 92.8  --   PLT 284  --     Basic Metabolic Panel:  Lab Results  Component Value Date   NA 142 10/14/2022   K 3.3 (L) 10/14/2022   CO2 23 10/14/2022   GLUCOSE 103 (H) 10/14/2022   BUN 22 10/14/2022   CREATININE 0.80 10/14/2022   CALCIUM 8.7 (L) 10/14/2022   GFRNONAA >60 10/14/2022   GFRAA 73 08/02/2019   Lipid Panel:  Lab Results  Component Value Date   LDLCALC 116 (H) 01/20/2022   HgbA1c:  Lab Results  Component Value Date   HGBA1C 5.2 01/20/2022   Urine Drug Screen:     Component Value Date/Time   LABOPIA NONE DETECTED 01/21/2022 0644   COCAINSCRNUR NONE DETECTED 01/21/2022 0644   LABBENZ NONE DETECTED 01/21/2022 0644   AMPHETMU NONE DETECTED 01/21/2022 0644   THCU NONE DETECTED 01/21/2022 0644   LABBARB NONE DETECTED 01/21/2022 0644    Alcohol Level     Component Value Date/Time   ETH <10 10/14/2022 1430     Impression   This is a 67 year old woman with past medical history significant for hypertension and hyperlipidemia who was brought in by EMS after a fall and subsequent difficulty speaking and mild R sided weakness.  Her symptoms  rapidly improved and by the time of my telemetry examination she had a very mild expressive aphasia as well as minimal drift in her right leg.  She had no other focal deficits.  She was able to name 100% of objects tested, was able to read, and was able to repeat.  She follows multistep commands.  NIH stroke scale was 2.  CT head showed no acute process on personal review.  TNK was not administered due to mild and rapidly improving symptoms.  Sx are concerning for TIA vs acute ischemic stroke.  Recommendations   - Recommend close monitoring in ED with vital signs and NIHSS both q 30 min until outside of the TNK window at 5:45pm.  If neurologic exam is worsens a stroke code should be immediately reactivated.  If his exam is stable or improved at 5:45pm patient should at that point be admitted to the hospitalist service for stroke/TIA work-up. - Permissive HTN x48 hrs from sx onset or until stroke ruled out by MRI goal BP <220/110. PRN labetalol or hydralazine if BP above these parameters. Avoid oral antihypertensives. - MRI brain wo contrast - CTA H&N - TTE - Check A1c and LDL + add statin per guidelines - ASA '81mg'$  daily + plavix '75mg'$  daily x21 days f/b plavix '75mg'$  daily - q4 hr neuro checks - STAT head CT for any change in neuro exam - Tele - PT/OT/SLP - Stroke education - Amb referral to neurology upon discharge   Please contact me if any further questions for neurology today. If further assistance from neurology is needed after today, please place a routine consult to Dr. Hortense Ramal M-F 8a-4p.  ______________________________________________________________________   Thank you for the opportunity to take part in the care of this patient. If you have any further questions,  please contact the neurology consultation attending.  Signed,  Su Monks, MD [facility] [phone]  If 7pm- 7am, please page neurology on call as listed in [place].  **Any copied and pasted  documentation in this note was written by me in another application not billed for and pasted by me into this document.

## 2022-10-14 NOTE — ED Provider Notes (Signed)
[facility] Provider Note   CSN: [id number] Arrival date & time: 10/14/22  1423  An emergency department physician performed an initial assessment on this suspected stroke patient at 1426.  History  Chief Complaint  Patient presents with   Code Stroke    Jacqueline Leon is a 67 y.o. female.  Pt is a 67 yo female with pmhx significant for HTN, HLD, TIA.  She was LSN at 1315 by husband.  She was found by husband on the ground after a fall.  She was having trouble speaking with right facial droop and right arm weakness.  Pt was noted to have a right ankle deformity as well.  Code stroke called by EMS and pt was taken directly to the CT scanner.  Dr. Quinn Axe (neuro) did examine pt.          Home Medications Prior to Admission medications   Medication Sig Start Date End Date Taking? Authorizing Provider  aspirin EC 81 MG tablet Take 81 mg by mouth daily. Swallow whole.    [provider]  atorvastatin (LIPITOR) 40 MG tablet Take 40 mg by mouth daily.    [provider]  cholecalciferol (VITAMIN D) 1000 UNITS tablet Take 1,000 Units by mouth daily.    [provider]  hydrochlorothiazide (MICROZIDE) 12.5 MG capsule Take 1 capsule (12.5 mg total) by mouth daily. 08/02/19   Estill Dooms, NP  meclizine (ANTIVERT) 25 MG tablet Take 25 mg by mouth daily as needed for dizziness.    [provider]  meloxicam (MOBIC) 7.5 MG tablet TAKE 1 TABLET BY MOUTH DAILY 09/14/22   Carole Civil, MD  Multiple Vitamin (MULTIVITAMIN) tablet Take 1 tablet by mouth daily.    [provider]      Allergies    Patient has no known allergies.    Review of Systems   Review of Systems  Musculoskeletal:        Right ankle pain  All other systems reviewed and are negative.   Physical Exam Updated Vital Signs BP 130/70 (BP Location: Right Arm)   Pulse 70   Resp 12   SpO2 100%  Physical Exam  ED Results  / Procedures / Treatments   Labs (all labs ordered are listed, but only abnormal results are displayed) Labs Reviewed  COMPREHENSIVE METABOLIC PANEL - Abnormal; Notable for the following components:      Result Value   Potassium 3.3 (*)    Glucose, Bld 104 (*)    Calcium 8.7 (*)    AST 44 (*)    All other components within normal limits  I-STAT CHEM 8, ED - Abnormal; Notable for the following components:   Potassium 3.3 (*)    Glucose, Bld 103 (*)    Calcium, Ion 1.04 (*)    Hemoglobin 15.3 (*)    All other components within normal limits  CBG MONITORING, ED - Abnormal; Notable for the following components:   Glucose-Capillary 116 (*)    All other components within normal limits  ETHANOL  PROTIME-INR  APTT  CBC  DIFFERENTIAL  RAPID URINE DRUG SCREEN, HOSP PERFORMED  URINALYSIS, ROUTINE W REFLEX MICROSCOPIC  MAGNESIUM  TROPONIN I (HIGH SENSITIVITY)    EKG EKG Interpretation  Date/Time:  Wednesday October 14 2022 14:57:23 EDT Ventricular Rate:  69 PR Interval:  173 QRS Duration: 95 QT Interval:  414 QTC Calculation: 444 R Axis:   -33 Text Interpretation: Sinus rhythm Ventricular trigeminy Left  axis deviation Low voltage, precordial leads Consider anterior infarct Baseline wander in lead(s) II III aVF trigeminy is new Confirmed by Isla Pence [phone]) on 10/14/2022 3:55:38 PM  Radiology DG Ankle Complete Right  Result Date: 10/14/2022 CLINICAL DATA:  Post reduction. EXAM: RIGHT ANKLE - COMPLETE 3+ VIEW COMPARISON:  10/14/2022 FINDINGS: Fracture-dislocation of the right ankle has been reduced. The ankle is located with residual irregularity and widening between the talus and distal tibia. Again noted is a displaced oblique distal fibular fracture. The ankle is now within a cast/splint. IMPRESSION: 1. Reduction of the right ankle fracture-dislocation. The ankle appears to be located. 2. Displaced fracture involving the distal fibula. Electronically Signed   By: Markus Daft  M.D.   On: 10/14/2022 16:02   CT ANGIO HEAD NECK W WO CM  Result Date: 10/14/2022 CLINICAL DATA:  Neuro deficit, acute, stroke suspected aphasia, R sided weakness, improving EXAM: CT ANGIOGRAPHY HEAD AND NECK TECHNIQUE: Multidetector CT imaging of the head and neck was performed using the standard protocol during bolus administration of intravenous contrast. Multiplanar CT image reconstructions and MIPs were obtained to evaluate the vascular anatomy. Carotid stenosis measurements (when applicable) are obtained utilizing NASCET criteria, using the distal internal carotid diameter as the denominator. RADIATION DOSE REDUCTION: This exam was performed according to the departmental dose-optimization program which includes automated exposure control, adjustment of the mA and/or kV according to patient size and/or use of iterative reconstruction technique. CONTRAST:  16m OMNIPAQUE IOHEXOL 350 MG/ML SOLN COMPARISON:  None Available. FINDINGS: CTA NECK FINDINGS Aortic arch: Calcific atherosclerosis. Aberrant right subclavian artery (anatomic variant) with severe stenosis of its origin. Otherwise, great vessel origins are patent. Right carotid system: No evidence of dissection, stenosis (50% or greater), or occlusion. Left carotid system: No evidence of dissection, stenosis (50% or greater), or occlusion. Vertebral arteries: Left dominant. No evidence of dissection, stenosis (50% or greater), or occlusion. Skeleton: Multilevel degenerative change. Other neck: No acute findings. Upper chest: Visualized lung apices are clear. Review of the MIP images confirms the above findings CTA HEAD FINDINGS Anterior circulation: Bilateral intracranial ICAs are patent with moderate right and mild left paraclinoid ICA stenosis. 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 Otherwise, the MCAs and ACAs are patent without proximal hemodynamically significant stenosis. Posterior circulation: Bilateral intracranial vertebral  arteries, basilar artery and bilateral posterior cerebral arteries are patent without proximal hemodynamically significant stenosis. Venous sinuses: As permitted by contrast timing, patent. Review of the MIP images confirms the above findings IMPRESSION: 1. No emergent large vessel occlusion. 2. Moderate right and mild left paraclinoid ICA stenosis. 3. Aberrant right subclavian artery (anatomic variant) with severe stenosis of its origin. Electronically Signed   By: FMargaretha SheffieldM.D.   On: 10/14/2022 15:34   DG Chest Portable 1 View  Result Date: 10/14/2022 CLINICAL DATA:  Code stroke.  Fell. EXAM: PORTABLE CHEST 1 VIEW COMPARISON:  07/11/2017 FINDINGS: The cardiac silhouette, mediastinal and hilar contours are normal. Low lung volumes with vascular crowding and streaky basilar atelectasis. No infiltrates or effusions. No pneumothorax. The bony thorax is intact. IMPRESSION: Low lung volumes with vascular crowding and streaky basilar atelectasis. Electronically Signed   By: PMarijo SanesM.D.   On: 10/14/2022 15:30   DG Ankle Right Port  Result Date: 10/14/2022 CLINICAL DATA:  [place]  Right ankle pain and deformity. EXAM: PORTABLE RIGHT ANKLE - 2 VIEW COMPARISON:  None FINDINGS: Fracture dislocation noted at the tibiotalar joint. The talus is dislocated laterally. There is an oblique coursing comminuted  fracture of the distal fibular shaft. No obvious tibia fracture. The talus appears intact. The subtalar joints are maintained. The mid and hindfoot bony structures are intact. IMPRESSION: 1. Fracture dislocation at the tibiotalar joint. 2. Comminuted fracture of the distal fibular shaft. Electronically Signed   By: Marijo Sanes M.D.   On: 10/14/2022 15:29   CT HEAD CODE STROKE WO CONTRAST  Result Date: 10/14/2022 CLINICAL DATA:  Code stroke.  Right-sided weakness EXAM: CT HEAD WITHOUT CONTRAST TECHNIQUE: Contiguous axial images were obtained from the base of the skull through the vertex without  intravenous contrast. RADIATION DOSE REDUCTION: This exam was performed according to the departmental dose-optimization program which includes automated exposure control, adjustment of the mA and/or kV according to patient size and/or use of iterative reconstruction technique. COMPARISON:  MRI Brain 01/21/22 FINDINGS: Brain: No evidence of acute infarction, hemorrhage, hydrocephalus, extra-axial collection or mass lesion/mass effect.Previously seen left frontal lobe infarct is not definitively visualized on this exam. Sequela of moderate chronic microvascular ischemic change. Vascular: No hyperdense vessel or unexpected calcification. Skull: Normal. Negative for fracture or focal lesion. Sinuses/Orbits: No middle ear or mastoid effusion. Paranasal sinuses are clear. Orbits are unremarkable. Other: None. ASPECTS ([place] Stroke Program Early CT Score): 10 IMPRESSION: 1. No hemorrhage or CT evidence of an acute infarct. 2. Aspects is 10. Electronically Signed   By: Marin Roberts M.D.   On: 10/14/2022 14:40    Procedures .Sedation  Date/Time: 10/14/2022 4:02 PM  Performed by: Isla Pence, MD Authorized by: Isla Pence, MD   Consent:    Consent obtained:  Written   Consent given by:  Patient Universal protocol:    Immediately prior to procedure, a time out was called: yes     Patient identity confirmed:  Verbally with patient Indications:    Procedure performed:  Fracture reduction   Procedure necessitating sedation performed by:  Physician performing sedation Pre-sedation assessment:    Time since last food or drink:  3   ASA classification: class 3 - patient with severe systemic disease     Mallampati score:  II - soft palate, uvula, fauces visible   Pre-sedation assessments completed and reviewed: airway patency, cardiovascular function, hydration status, mental status, nausea/vomiting, pain level, respiratory function and temperature   Procedure details (see MAR for exact dosages):     Sedation:  Etomidate, midazolam and propofol   Intended level of sedation: deep   Intra-procedure monitoring:  Blood pressure monitoring, cardiac monitor, continuous pulse oximetry, continuous capnometry and frequent LOC assessments   Intra-procedure events: none     Total Provider sedation time (minutes):  30 Post-procedure details:    Post-sedation assessment completed:  10/14/2022 4:03 PM   Attendance: Constant attendance by certified staff until patient recovered     Recovery: Patient returned to pre-procedure baseline     Post-sedation assessments completed and reviewed: airway patency, cardiovascular function, hydration status, mental status, nausea/vomiting, pain level, respiratory function and temperature     Patient is stable for discharge or admission: yes     Procedure completion:  Tolerated well, no immediate complications Reduction of fracture  Date/Time: 10/14/2022 4:03 PM  Performed by: Isla Pence, MD Authorized by: Isla Pence, MD  Consent: Written consent obtained. Consent given by: patient Patient identity confirmed: verbally with patient Preparation: Patient was prepped and draped in the usual sterile fashion. Local anesthesia used: no  Anesthesia: Local anesthesia used: no  Sedation: Patient sedated: yes Sedatives: midazolam, propofol and etomidate  Patient tolerance: patient tolerated the  procedure well with no immediate complications   .Ortho Injury Treatment  Date/Time: 10/14/2022 4:03 PM  Performed by: Isla Pence, MD Authorized by: Isla Pence, MD   Consent:    Consent obtained:  [place] location: ankle Location details: right ankle Injury type: fracture-dislocation Pre-procedure neurovascular assessment: neurovascularly intact Pre-procedure distal perfusion: normal Pre-procedure neurological function: normal Pre-procedure range of motion: reduced  Anesthesia: Local anesthesia used: no  Patient sedated: Yes. Refer to  sedation procedure documentation for details of sedation. Immobilization: splint Splint type: ankle stirrup and long leg Splint Applied by: ED Nurse and ED Provider Supplies used: cotton padding, elastic bandage and Ortho-Glass Post-procedure neurovascular assessment: post-procedure neurovascularly intact Post-procedure distal perfusion: normal Post-procedure neurological function: normal Post-procedure range of motion: improved       Medications Ordered in ED Medications  etomidate (AMIDATE) injection 10 mg (has no administration in time range)  midazolam (VERSED) 5 MG/5ML injection 1 mg (has no administration in time range)  propofol (DIPRIVAN) 10 mg/mL bolus/IV push (has no administration in time range)  iohexol (OMNIPAQUE) 350 MG/ML injection 75 mL (75 mLs Intravenous Contrast Given 10/14/22 1518)  etomidate (AMIDATE) injection (10 mg Intravenous Given 10/14/22 1528)  midazolam (VERSED) 5 MG/5ML injection (0.5 mg Intravenous Given 10/14/22 1531)  propofol (DIPRIVAN) 10 mg/mL bolus/IV push (20 mcg Intravenous Given 10/14/22 1538)    ED Course/ Medical Decision Making/ A&P                             Medical Decision Making Amount and/or Complexity of Data Reviewed Labs: ordered. Radiology: ordered.  Risk Prescription drug management. Decision regarding hospitalization.   This patient presents to the ED for concern of cva, this involves an extensive number of treatment options, and is a complaint that carries with it a high risk of complications and morbidity.  The differential diagnosis includes cva, tia, head bleed   Co morbidities that complicate the patient evaluation  Htn, hld, and tia   Additional history obtained:  Additional history obtained from epic chart review External records from outside source obtained and reviewed including EMS report/husband   Lab Tests:  I Ordered, and personally interpreted labs.  The pertinent results include:  cbc nl, inr 1.o,  cmp nl   Imaging Studies ordered:  I ordered imaging studies including ct head/cta, cxr, right ankle  I independently visualized and interpreted imaging which showed  CT head: . No hemorrhage or CT evidence of an acute infarct.  2. Aspects is 10.  CTA head/neck: . No emergent large vessel occlusion.  2. Moderate right and mild left paraclinoid ICA stenosis.  3. Aberrant right subclavian artery (anatomic variant) with severe  stenosis of its origin.  CXR: Low lung volumes with vascular crowding and streaky basilar  atelectasis.   R ankle:  Fracture dislocation at the tibiotalar joint.  2. Comminuted fracture of the distal fibular shaft.   Post-reduction:  Reduction of the right ankle fracture-dislocation. The ankle  appears to be located.  2. Displaced fracture involving the distal fibula.    I agree with the radiologist interpretation   Cardiac Monitoring:  The patient was maintained on a cardiac monitor.  I personally viewed and interpreted the cardiac monitored which showed an underlying rhythm of: nsr   Medicines ordered and prescription drug management:  I ordered medication including   for sedation  Reevaluation of the patient after these medicines showed that the patient improved I have reviewed the patients  home medicines and have made adjustments as needed   Test Considered:  xr   Critical Interventions:  Code stroke/fx reduction   Consultations Obtained:  I requested consultation with the neurologist (Dr. Quinn Axe),  and discussed lab and imaging findings as well as pertinent plan - she recommends:  Recommend close monitoring in ED with vital signs and NIHSS both q 30 min until outside of the TNK window at 5:45pm.  If neurologic exam is worsens a stroke code should be immediately reactivated.  If his exam is stable or improved at 5:45pm patient should at that point be admitted to the hospitalist service for stroke/TIA work-up. - Permissive HTN x48 hrs  from sx onset or until stroke ruled out by MRI goal BP <220/110. PRN labetalol or hydralazine if BP above these parameters. Avoid oral antihypertensives. - MRI brain wo contrast - CTA H&N - TTE - Check A1c and LDL + add statin per guidelines - ASA '81mg'$  daily + plavix '75mg'$  daily x21 days f/b plavix '75mg'$  daily - q4 hr neuro checks - STAT head CT for any change in neuro exam - Tele - PT/OT/SLP - Stroke education - Amb referral to neurology upon discharge   PT d/w Dr. Marlowe Sax (triad) for admission Pt d/w Dr. Amedeo Kinsman (ortho) who will see pt in consult.   Problem List / ED Course:  CVA:  rapidly improving.  Due to this, she does not meet TNK criteria.  No LVO on CTA for IR.  Recs per Dr. Quinn Axe.  Pt will be admitted to the hospitalist. R ankle fx:  reduced.  Dr. Amedeo Kinsman to consult.   Reevaluation:  After the interventions noted above, I reevaluated the patient and found that they have :improved   Social Determinants of Health:  Lives at home   Dispostion:  After consideration of the diagnostic results and the patients response to treatment, I feel that the patent would benefit from admission.    CRITICAL CARE Performed by: Isla Pence   Total critical care time: 30 minutes  Critical care time was exclusive of separately billable procedures and treating other patients.  Critical care was necessary to treat or prevent imminent or life-threatening deterioration.  Critical care was time spent personally by me on the following activities: development of treatment plan with patient and/or surrogate as well as nursing, discussions with consultants, evaluation of patient's response to treatment, examination of patient, obtaining history from patient or surrogate, ordering and performing treatments and interventions, ordering and review of laboratory studies, ordering and review of radiographic studies, pulse oximetry and re-evaluation of patient's condition.         Final  Clinical Impression(s) / ED Diagnoses Final diagnoses:  Cerebrovascular accident (CVA), unspecified mechanism ([place])  Closed fracture of right ankle, initial encounter  Dislocation of right ankle joint, initial encounter    Rx / DC Orders ED Discharge Orders     None         Isla Pence, MD 10/14/22 [phone]

## 2022-10-15 ENCOUNTER — Other Ambulatory Visit (HOSPITAL_COMMUNITY): Payer: Self-pay | Admitting: *Deleted

## 2022-10-15 ENCOUNTER — Inpatient Hospital Stay (HOSPITAL_COMMUNITY): Payer: Medicare Other

## 2022-10-15 ENCOUNTER — Other Ambulatory Visit: Payer: Self-pay

## 2022-10-15 ENCOUNTER — Inpatient Hospital Stay (HOSPITAL_COMMUNITY)
Admit: 2022-10-15 | Discharge: 2022-10-15 | Disposition: A | Discharge status: Home / Self Care | Payer: Medicare Other | Attending: Family Medicine | Admitting: Family Medicine

## 2022-10-15 DIAGNOSIS — S82891A Other fracture of right lower leg, initial encounter for closed fracture: Secondary | ICD-10-CM | POA: Diagnosis not present

## 2022-10-15 DIAGNOSIS — I6389 Other cerebral infarction: Secondary | ICD-10-CM

## 2022-10-15 LAB — COMPREHENSIVE METABOLIC PANEL
ALT: 25 U/L (ref 0–44)
AST: 30 U/L (ref 15–41)
Albumin: 3.2 g/dL — ABNORMAL LOW (ref 3.5–5.0)
Alkaline Phosphatase: 53 U/L (ref 38–126)
Anion gap: 8 (ref 5–15)
BUN: 14 mg/dL (ref 8–23)
CO2: 24 mmol/L (ref 22–32)
Calcium: 8.2 mg/dL — ABNORMAL LOW (ref 8.9–10.3)
Chloride: 106 mmol/L (ref 98–111)
Creatinine, Ser: 0.82 mg/dL (ref 0.44–1.00)
GFR, Estimated: >60 mL/min (ref >60)
Glucose, Bld: 95 mg/dL (ref 70–99)
Potassium: 3.6 mmol/L (ref 3.5–5.1)
Sodium: 138 mmol/L (ref 135–145)
Total Bilirubin: 0.9 mg/dL (ref 0.3–1.2)
Total Protein: 6.4 g/dL — ABNORMAL LOW (ref 6.5–8.1)

## 2022-10-15 LAB — CBC WITH DIFFERENTIAL/PLATELET
Abs Immature Granulocytes: 0.02 10*3/uL (ref 0.00–0.07)
Basophils Absolute: 0 10*3/uL (ref 0.0–0.1)
Basophils Relative: 0 %
Eosinophils Absolute: 0.1 10*3/uL (ref 0.0–0.5)
Eosinophils Relative: 1 %
HCT: 41.8 % (ref 36.0–46.0)
Hemoglobin: 13.4 g/dL (ref 12.0–15.0)
Immature Granulocytes: 0 %
Lymphocytes Relative: 18 %
Lymphs Abs: 2.1 10*3/uL (ref 0.7–4.0)
MCH: 30.3 pg (ref 26.0–34.0)
MCHC: 32.1 g/dL (ref 30.0–36.0)
MCV: 94.6 fL (ref 80.0–100.0)
Monocytes Absolute: 1.1 10*3/uL — ABNORMAL HIGH (ref 0.1–1.0)
Monocytes Relative: 9 %
Neutro Abs: 8.3 10*3/uL — ABNORMAL HIGH (ref 1.7–7.7)
Neutrophils Relative %: 72 %
Platelets: 227 10*3/uL (ref 150–400)
RBC: 4.42 MIL/uL (ref 3.87–5.11)
RDW: 13.6 % (ref 11.5–15.5)
WBC: 11.6 10*3/uL — ABNORMAL HIGH (ref 4.0–10.5)
nRBC: 0 % (ref 0.0–0.2)

## 2022-10-15 LAB — LIPID PANEL
Cholesterol: 124 mg/dL (ref 0–200)
HDL: 49 mg/dL (ref >40)
LDL Cholesterol: 68 mg/dL (ref 0–99)
Total CHOL/HDL Ratio: 2.5 RATIO
Triglycerides: 34 mg/dL (ref <150)
VLDL: 7 mg/dL (ref 0–40)

## 2022-10-15 LAB — ECHOCARDIOGRAM COMPLETE
Area-P 1/2: 4.39 cm2
Height: 64 in
S' Lateral: 2.6 cm
Weight: 3015.89 oz

## 2022-10-15 LAB — T4, FREE: Free T4: 0.87 ng/dL (ref 0.61–1.12)

## 2022-10-15 LAB — RAPID URINE DRUG SCREEN, HOSP PERFORMED
Amphetamines: NOT DETECTED
Barbiturates: NOT DETECTED
Benzodiazepines: POSITIVE — AB
Cocaine: NOT DETECTED
Opiates: NOT DETECTED
Tetrahydrocannabinol: NOT DETECTED

## 2022-10-15 LAB — URINALYSIS, ROUTINE W REFLEX MICROSCOPIC
Bilirubin Urine: NEGATIVE
Glucose, UA: NEGATIVE mg/dL
Ketones, ur: NEGATIVE mg/dL
Leukocytes,Ua: NEGATIVE
Nitrite: NEGATIVE
Protein, ur: NEGATIVE mg/dL
Specific Gravity, Urine: <1.005 — ABNORMAL LOW (ref 1.005–1.030)
pH: 6.5 (ref 5.0–8.0)

## 2022-10-15 LAB — URINALYSIS, MICROSCOPIC (REFLEX)

## 2022-10-15 LAB — MAGNESIUM: Magnesium: 2 mg/dL (ref 1.7–2.4)

## 2022-10-15 LAB — MRSA NEXT GEN BY PCR, NASAL: MRSA by PCR Next Gen: NOT DETECTED

## 2022-10-15 LAB — TSH: TSH: 2.703 u[IU]/mL (ref 0.350–4.500)

## 2022-10-15 MED ORDER — ASPIRIN 81 MG PO CHEW
81.0000 mg | CHEWABLE_TABLET | Freq: Every day | ORAL | Status: DC
  Administered 2022-10-16: 81 mg via ORAL
  Filled 2022-10-15: qty 1

## 2022-10-15 MED ORDER — ENOXAPARIN SODIUM 40 MG/0.4ML IJ SOSY: 40.0000 mg | PREFILLED_SYRINGE | INTRAMUSCULAR | Status: DC

## 2022-10-15 MED ORDER — CLOPIDOGREL BISULFATE 75 MG PO TABS
75.0000 mg | ORAL_TABLET | Freq: Every day | ORAL | Status: DC
  Administered 2022-10-15 – 2022-10-16 (×2): 75 mg via ORAL
  Filled 2022-10-15 (×2): qty 1

## 2022-10-15 NOTE — Plan of Care (Signed)
  Problem: Acute Rehab PT Goals(only PT should resolve) Goal: Pt Will Go Supine/Side To Sit Outcome: Progressing Flowsheets (Taken 10/15/2022 1427) Pt will go Supine/Side to Sit: with modified independence Goal: Patient Will Transfer Sit To/From Stand Outcome: Progressing Flowsheets (Taken 10/15/2022 1427) Patient will transfer sit to/from stand:  with modified independence  with supervision Goal: Pt Will Transfer Bed To Chair/Chair To Bed Outcome: Progressing Flowsheets (Taken 10/15/2022 1427) Pt will Transfer Bed to Chair/Chair to Bed:  with modified independence  with supervision Goal: Pt Will Ambulate Outcome: Progressing Flowsheets (Taken 10/15/2022 1427) Pt will Ambulate:  25 feet  with min guard assist  with rolling walker   2:27 PM, 10/15/22 Lonell Grandchild, MPT Physical Therapist with [facility] [phone] office [phone] mobile phone

## 2022-10-15 NOTE — Progress Notes (Signed)
SLP Cancellation Note  Patient Details Name: Deania Rockwood Farooq MRN: [id number] DOB: [birth year]   Cancelled treatment:       Reason Eval/Treat Not Completed: SLP screened, no needs identified, will sign off; SLP screened Pt in room. Pt denies any changes in swallowing, speech, language, or cognition. Pt and spouse report some difficulty in expressing self yesterday, but has since resolved. SLE will be deferred at this time. Reconsult if indicated. SLP will sign off.  Thank you,  Genene Churn, [place]   [place] 10/15/2022, 4:25 PM

## 2022-10-15 NOTE — Care Plan (Signed)
MRI brain reviewed with Dr Leonie Man. OK to continue DAPT . Also recommend 30 day event monitor.  F/u with neuro ( order placed).  Discussed plan with Dr Bonner Puna

## 2022-10-15 NOTE — Evaluation (Signed)
Occupational Therapy Evaluation Patient Details Name: Jacqueline Leon MRN: [id number] DOB: Jul 04, 1956 Today's Date: 10/15/2022   History of Present Illness Jacqueline Leon is a 67 y.o. female with PMH significant of HTN, HLD, recurrent dizziness reported secondary to inner ear issues, HLD.  Patient presented to the hospital after an unwitnessed event at home.  Patient and her husband were talking in the kitchen.  Patient went upstairs.  Husband heard a thud and a few minutes later he heard a second thud.  He called for his wife and she did not respond so he went upstairs and found that she was on the ground sitting upright with inability to speak.  She appeared to have injured her right foot.  911 was called.  Last known well was 1:15 PM.  Presented as a code stroke in the ER.  Had some right-sided weakness as well.  At the time of my evaluation patient does not have any headache.  Patient is able to speak and answer questions but appears to have difficulty finding her words.  No tingling or numbness reported.  No chest pain.  No abdominal pain.  No dizziness or lightheadedness.  No nausea no vomiting.  No diarrhea.  No burning no fever no chills reported.  No recent change in the medications reported.  Patient has a history of recurrent dizziness spells which her PCP has diagnosed as inner ear issues and prescribed her meclizine for that.  Last use was somewhere last month.  No alcohol abuse no drug abuse.  No history of smoking.  Neurology was consulted in the ED.  TNK was not administered as the symptoms were mild and rapidly improving. (per MD)   Clinical Impression   Pt agreeable to OT and PT co-evaluation. Pt was quiet and sometimes slow to respond. Pt reported no word finding deficits despite seeming like this could be the case. Splint was on pt's R LE. Pt was able to completed bed mobility without physical assist. Min G to min A for ambulation in the room and to toilet. Assist needed for donning R LE  sock and for toileting. Pt has 24/7 assist from husband at home. Pt was left on the toilet with husband present and nursing notified that pt would need assist for peri-care. Pt will benefit from continued OT in the hospital and recommended venue below to increase strength, balance, and endurance for safe ADL's.         Recommendations for follow up therapy are one component of a multi-disciplinary discharge planning process, led by the attending physician.  Recommendations may be updated based on patient status, additional functional criteria and insurance authorization.   Follow Up Recommendations  Home health OT     Assistance Recommended at Discharge Frequent or constant Supervision/Assistance  Patient can return home with the following A little help with walking and/or transfers;A lot of help with bathing/dressing/bathroom;Assistance with cooking/housework;Assist for transportation;Help with stairs or ramp for entrance    Functional Status Assessment  Patient has had a recent decline in their functional status and demonstrates the ability to make significant improvements in function in a reasonable and predictable amount of time.  Equipment Recommendations  None recommended by OT           Precautions / Restrictions Precautions Precautions: Fall Required Braces or Orthoses: Splint/Cast Splint/Cast: R LE Restrictions Weight Bearing Restrictions: No (No official WB status found in chart.) RLE Weight Bearing: Partial weight bearing      Mobility Bed Mobility Overal  bed mobility: Needs Assistance Bed Mobility: Supine to Sit     Supine to sit: Min guard, Supervision     General bed mobility comments: Mild labored movement and extended time.    Transfers Overall transfer level: Needs assistance Equipment used: Rolling walker (2 wheels) Transfers: Sit to/from Stand, Bed to chair/wheelchair/BSC Sit to Stand: Min guard     Step pivot transfers: Min guard, Min assist      General transfer comment: Labored movement and extended time.      Balance Overall balance assessment: Needs assistance Sitting-balance support: No upper extremity supported, Feet supported Sitting balance-Leahy Scale: Good Sitting balance - Comments: seated at EOB   Standing balance support: During functional activity, Bilateral upper extremity supported, Reliant on assistive device for balance Standing balance-Leahy Scale: Fair Standing balance comment: poor to fair with RW                           ADL either performed or assessed with clinical judgement   ADL Overall ADL's : Needs assistance/impaired     Grooming: Set up;Sitting   Upper Body Bathing: Set up;Sitting   Lower Body Bathing: Moderate assistance;Maximal assistance;Sitting/lateral leans   Upper Body Dressing : Set up;Sitting   Lower Body Dressing: Moderate assistance;Bed level Lower Body Dressing Details (indicate cue type and reason): Able to don L sock; assist for R due to splint. Toilet Transfer: Min guard;Minimal assistance;Ambulation;Rolling walker (2 wheels) Toilet Transfer Details (indicate cue type and reason): EOB to toilet with RW Toileting- Clothing Manipulation and Hygiene: Moderate assistance;Sit to/from stand;Sitting/lateral lean Toileting - Clothing Manipulation Details (indicate cue type and reason): Assist to remove underwear prior to sitting on the toilet. Likely assist needed for peri-care.     Functional mobility during ADLs: Min guard;Minimal assistance;Rolling walker (2 wheels)       Vision Baseline Vision/History: 1 Wears glasses Ability to See in Adequate Light: 0 Adequate Patient Visual Report: No change from baseline Vision Assessment?: No apparent visual deficits (Some difficulty keeping eyes forward during visual fields test.)                Pertinent Vitals/Pain Pain Assessment Pain Assessment: 0-10 Pain Score: 8  Pain Location: R foot Pain Descriptors /  Indicators: Sharp Pain Intervention(s): Limited activity within patient's tolerance, Monitored during session, Repositioned     Hand Dominance Right   Extremity/Trunk Assessment Upper Extremity Assessment Upper Extremity Assessment: Overall WFL for tasks assessed   Lower Extremity Assessment Lower Extremity Assessment: Defer to PT evaluation   Cervical / Trunk Assessment Cervical / Trunk Assessment: Normal   Communication Communication Communication: Other (comment) (Pt reported no difficulties, but seemed to possibly have some word finding trouble.)   Cognition Arousal/Alertness: Awake/alert Behavior During Therapy: WFL for tasks assessed/performed Overall Cognitive Status: Within Functional Limits for tasks assessed                                                        Home Living Family/patient expects to be discharged to:: Private residence Living Arrangements: Spouse/significant other Available Help at Discharge: Family;Available 24 hours/day Type of Home: House Home Access: Stairs to enter [employer] of Steps: 3 Entrance Stairs-Rails: Can reach both Home Layout: Two level;Able to live on main level with bedroom/bathroom Alternate Level Stairs-Number of  Steps: 13 Alternate Level Stairs-Rails: Right Bathroom Shower/Tub: Tub/shower unit   Bathroom Toilet: Handicapped height Bathroom Accessibility: Yes How Accessible: Accessible via walker Home Equipment: [place] (2 wheels);Cane - single point;BSC/3in1   Additional Comments: Pt reports no change in home lving situation.      Prior Functioning/Environment Prior Level of Function : Independent/Modified Independent             Mobility Comments: community ambulator without AD ADLs Comments: Independent        OT Problem List: Decreased strength;Decreased activity tolerance;Decreased range of motion;Impaired balance (sitting and/or standing);Pain      OT  Treatment/Interventions: Self-care/ADL training;Therapeutic exercise;Neuromuscular education;Therapeutic activities;DME and/or AE instruction;Patient/family education;Balance training    OT Goals(Current goals can be found in the care plan section) Acute Rehab OT Goals Patient Stated Goal: return home OT Goal Formulation: With patient/family Time For Goal Achievement: 10/29/22 Potential to Achieve Goals: Good  OT Frequency: Min 2X/week    Co-evaluation PT/OT/SLP Co-Evaluation/Treatment: Yes Reason for Co-Treatment: To address functional/ADL transfers   OT goals addressed during session: ADL's and self-care                       End of Session Equipment Utilized During Treatment: Rolling walker (2 wheels) Nurse Communication: Other (comment) (Notified pt was on toilet and would need assist.)  Activity Tolerance: Patient tolerated treatment well Patient left: Other (comment) (Pt left on toilet with family present and nursing notified that pt would need assist for peri-care.)  OT Visit Diagnosis: Unsteadiness on feet (R26.81);Other abnormalities of gait and mobility (R26.89);Muscle weakness (generalized) (M62.81);History of falling (Z91.81);Other symptoms and signs involving the nervous system [id number])                Time: [id number] OT Time Calculation (min): 21 min Charges:  OT General Charges $OT Visit: 1 Visit OT Evaluation $OT Eval Low Complexity: 1 Low  Min [place] OT, MOT   Larey Seat 10/15/2022, 12:01 PM

## 2022-10-15 NOTE — Progress Notes (Signed)
*  PRELIMINARY RESULTS* Echocardiogram 2D Echocardiogram has been performed.  Jacqueline Leon 10/15/2022, 12:32 PM

## 2022-10-15 NOTE — TOC Initial Note (Signed)
Transition of Care [facility]) - Initial/Assessment Note    Patient Details  Name: Jacqueline Leon MRN: [id number] Date of Birth: [birth year]  Transition of [facility]) CM/SW Contact:    Ihor Gully, LCSW Phone Number: 10/15/2022, 3:37 PM  Clinical Narrative:                 Spouse is agreeable to [facility]. Accepted by Tommi Rumps with [facility] for [place]. WC ordered via Erasmo Downer with [employer].   Expected Discharge Plan: [place] Barriers to Discharge: Continued Medical Work up   Patient Goals and CMS Choice            Expected Discharge Plan and Services                                              Prior Living Arrangements/Services                       Activities of Daily Living Home Assistive Devices/Equipment: None ADL Screening (condition at time of admission) Patient's cognitive ability adequate to safely complete daily activities?: Yes Is the patient deaf or have difficulty hearing?: No Does the patient have difficulty seeing, even when wearing glasses/contacts?: No Does the patient have difficulty concentrating, remembering, or making decisions?: Yes Patient able to express need for assistance with ADLs?: Yes Does the patient have difficulty dressing or bathing?: No Independently performs ADLs?: No Communication: Independent Dressing (OT): Independent Grooming: Independent Feeding: Independent Bathing: Independent Toileting: Needs assistance Is this a change from baseline?: Change from baseline, expected to last <3 days In/Out Bed: Needs assistance Is this a change from baseline?: Change from baseline, expected to last >3 days Walks in Home: Independent Does the patient have difficulty walking or climbing stairs?: No Weakness of Legs: None Weakness of Arms/Hands: None  Permission Sought/Granted                  Emotional Assessment              Admission diagnosis:  Dislocation of right ankle joint, initial encounter  [S93.04XA] Closed fracture of right ankle, initial encounter [S82.891A] Acute CVA (cerebrovascular accident) ([place]) [I63.9] Cerebrovascular accident (CVA), unspecified mechanism ([place]) [I63.9] Patient Active Problem List   Diagnosis Date Noted   Closed fracture of right ankle 10/15/2022   S/P hysterectomy 03/03/2022   Mixed hyperlipidemia 01/21/2022   Acute CVA (cerebrovascular accident) ([place]) 01/20/2022   Encounter for screening fecal occult blood testing 10/21/2020   Encounter for well woman exam with routine gynecological exam 10/21/2020   Smear, vaginal, as part of routine gynecological examination 08/02/2019   Screening for colorectal cancer 12/17/2017   Elevated cholesterol 12/17/2017   Essential hypertension 12/17/2017   Well woman exam with routine gynecological exam 12/17/2017   S/P ORIF (open reduction internal fixation) fracture 07/12/17 07/22/2017   Closed trimalleolar fracture of ankle with routine healing, left 07/11/2017   History of cervical cancer 06/02/2013   DEGENERATIVE JOINT DISEASE, RIGHT KNEE 09/09/2010   DERANGEMENT OF POSTERIOR HORN OF MEDIAL MENISCUS 09/09/2010   ANKLE PAIN 09/12/2008   PCP:  Sharilyn Sites, MD Pharmacy:   [facility] ([place]) - [place], [place] [facility] [address] [place] [address] Phone: [phone] Fax: [place], [place]  [place] [address] Phone: [phone] Fax: [phone] - [place], [place] [address] Phone: [phone] Fax: [phone]     Social Determinants of Health (SDOH) Social History: SDOH Screenings   Food Insecurity: No Food Insecurity (10/15/2022)  Housing: Low Risk  (10/15/2022)  Transportation Needs: No Transportation Needs (10/15/2022)  Utilities: Not At Risk (10/15/2022)  Alcohol Screen: Low  Risk  (03/03/2022)  Depression (PHQ2-9): Low Risk  (03/03/2022)  Financial Resource Strain: Low Risk  (03/03/2022)  Physical Activity: Insufficiently Active (03/03/2022)  Social Connections: Moderately Integrated (03/03/2022)  Stress: No Stress Concern Present (03/03/2022)  Tobacco Use: Medium Risk (10/14/2022)   SDOH Interventions:     Readmission Risk Interventions     No data to display

## 2022-10-15 NOTE — Procedures (Signed)
Patient Name: Jacqueline Leon  MRN: [id number]  Epilepsy Attending: Lora Havens  Referring Physician/Provider: Lavina Hamman, MD  Date: 10/15/2022 Duration: 22.42 mins  Patient history: 67yo F with transient speech disturbance. EEG to evaluate for seizure  Level of alertness: Awake, asleep  AEDs during EEG study: None  Technical aspects: This EEG study was done with scalp electrodes positioned according to the 10-20 International system of electrode placement. Electrical activity was reviewed with band pass filter of 1-'70Hz'$ , sensitivity of 7 uV/mm, display speed of 65m/sec with a '60Hz'$  notched filter applied as appropriate. EEG data were recorded continuously and digitally stored.  Video monitoring was available and reviewed as appropriate.  Description: The posterior dominant rhythm consists of 9-10 Hz activity of moderate voltage (25-35 uV) seen predominantly in posterior head regions, symmetric and reactive to eye opening and eye closing. Sleep was characterized by vertex waves, sleep spindles (12 to 14 Hz), maximal frontocentral region. Physiologic photic driving was not seen during photic stimulation.  Hyperventilation was not performed.     IMPRESSION: This study is within normal limits. No seizures or epileptiform discharges were seen throughout the recording.  A normal interictal EEG does not exclude the diagnosis of epilepsy.  Love Chowning OBarbra Sarks

## 2022-10-15 NOTE — Progress Notes (Signed)
RN notified me that pt will be going to HD not sure if they will be available for EEG. Will try back for EEG as schedule permits.

## 2022-10-15 NOTE — Plan of Care (Signed)
  Problem: Education: Goal: Knowledge of disease or condition will improve Outcome: Progressing   Problem: Ischemic Stroke/TIA Tissue Perfusion: Goal: Complications of ischemic stroke/TIA will be minimized Outcome: Progressing   Problem: Coping: Goal: Will verbalize positive feelings about self Outcome: Progressing Goal: Will identify appropriate support needs Outcome: Progressing   Problem: Self-Care: Goal: Ability to communicate needs accurately will improve Outcome: Progressing   Problem: Nutrition: Goal: Risk of aspiration will decrease Outcome: Progressing   Problem: Education: Goal: Knowledge of General Education information will improve Description: Including pain rating scale, medication(s)/side effects and non-pharmacologic comfort measures Outcome: Progressing

## 2022-10-15 NOTE — Consult Note (Signed)
ORTHOPAEDIC CONSULTATION  REQUESTING PHYSICIAN: [name], MD  ASSESSMENT AND PLAN: 67 y.o. female with the following: Right ankle fracture  Patient has been admitted for medical reasons.  She is currently being treated for an acute stroke.  As such, there is no urgency to fix the right ankle.  However, I do think she will require operative fixation.  This can be coordinated depending on her hospital course.  This was discussed with the patient and her daughter.  All questions were answered.  - Weight Bearing Status/Activity: Nonweightbearing in her splint  - Additional recommended labs/tests: None  -VTE Prophylaxis: At the discretion of the medicine team  - Pain control: As needed  - Follow-up plan: To be determined  -Procedures: Operative fixation of the right ankle fracture, date to be determined.  Chief Complaint: Right ankle pain  HPI: Jacqueline Leon is a 67 y.o. female with past medical history as listed below, who presented to the emergency department yesterday with signs and symptoms consistent with a stroke.  She was at home, in her usual state of health.  She was dizzy, and fell.  She sustained an injury to her right ankle.  She felt and heard a pop.  The ankle was reduced in the emergency department, and she is comfortable in her splint.  Pain is controlled.  Past Medical History:  Diagnosis Date   Abnormal Pap smear    History of cervical cancer    Hyperlipidemia    Hypertension    Inner ear inflammation    Mini stroke 01/2022   Vaginal Pap smear, abnormal    Past Surgical History:  Procedure Laterality Date   ABDOMINAL HYSTERECTOMY     ORIF ANKLE FRACTURE Left 07/12/2017   Procedure: OPEN REDUCTION INTERNAL FIXATION (ORIF) ANKLE FRACTURE;  Surgeon: Carole Civil, MD;  Location: [facility];  Service: Orthopedics;  Laterality: Left;   Social History   Socioeconomic History   Marital status: Married    Spouse name: Not on file   Number of children:  Not on file   Years of education: Not on file   Highest education level: Not on file  Occupational History   Not on file  Tobacco Use   Smoking status: Former    Types: Cigarettes    Quit date: 08/03/2006    Years since quitting: 16.2   Smokeless tobacco: Never  Vaping Use   Vaping Use: Never used  Substance and Sexual Activity   Alcohol use: Yes    Comment: occassional   Drug use: No   Sexual activity: Yes    Birth control/protection: Surgical    Comment: hyst  Other Topics Concern   Not on file  Social History Narrative   Not on file   Social Determinants of Health   Financial Resource Strain: Low Risk  (03/03/2022)   Overall Financial Resource Strain (CARDIA)    Difficulty of Paying Living Expenses: Not hard at all  Food Insecurity: No Food Insecurity (10/15/2022)   Hunger Vital Sign    Worried About Running Out of Food in the Last Year: Never true    [place] in the Last Year: Never true  Transportation Needs: No Transportation Needs (10/15/2022)   PRAPARE - Hydrologist (Medical): No    Lack of Transportation (Non-Medical): No  Physical Activity: Insufficiently Active (03/03/2022)   Exercise Vital Sign    Days of Exercise per Week: 2 days    Minutes of  Exercise per Session: 30 min  Stress: No Stress Concern Present (03/03/2022)   Advance    Feeling of Stress : Not at all  Social Connections: Moderately Integrated (03/03/2022)   Social Connection and Isolation Panel [NHANES]    Frequency of Communication with Friends and Family: More than three times a week    Frequency of Social Gatherings with Friends and Family: Three times a week    Attends Religious Services: More than 4 times per year    Active Member of Clubs or Organizations: No    Attends Archivist Meetings: Never    Marital Status: Married   Family History  Problem Relation Age of Onset    Hypertension Mother    Other Mother        passed away from childbirth   Hypertension Father    Hypertension Maternal Grandfather    Other Brother        had a pacemaker   Kidney disease Brother    Other Daughter        on dialysis   No Known Allergies Prior to Admission medications   Medication Sig Start Date End Date Taking? Authorizing Provider  aspirin EC 81 MG tablet Take 81 mg by mouth daily. Swallow whole.   Yes [provider]  atorvastatin (LIPITOR) 40 MG tablet Take 40 mg by mouth daily.   Yes [provider]  cholecalciferol (VITAMIN D) 1000 UNITS tablet Take 1,000 Units by mouth daily.   Yes [provider]  hydrochlorothiazide (MICROZIDE) 12.5 MG capsule Take 1 capsule (12.5 mg total) by mouth daily. 08/02/19  Yes Estill Dooms, NP  meclizine (ANTIVERT) 25 MG tablet Take 25 mg by mouth daily as needed for dizziness.   Yes [provider]  meloxicam (MOBIC) 7.5 MG tablet TAKE 1 TABLET BY MOUTH DAILY 09/14/22  Yes Carole Civil, MD  Multiple Vitamin (MULTIVITAMIN) tablet Take 1 tablet by mouth daily.   Yes [provider]   MR BRAIN WO CONTRAST  Result Date: 10/15/2022 CLINICAL DATA:  Neuro deficit, acute, stroke suspected. EXAM: MRI HEAD WITHOUT CONTRAST TECHNIQUE: Multiplanar, multiecho pulse sequences of the brain and surrounding structures were obtained without intravenous contrast. COMPARISON:  Head CT October 14, 2022. MRI of the brain January 22, 2019. FINDINGS: Brain: Area of restricted diffusion involving the superior/medial left frontal lobe consistent with left ACA acute territory infarct. Petechial hemorrhage is seen at the medial cortical ribbon (HI1). No significant mass effect. No hydrocephalus, extra-axial collection or mass lesion. Scattered foci of T2 hyperintensity are seen within the white matter of the cerebral hemispheres, nonspecific, most likely related to chronic small vessel ischemia, similar to prior MRI.  Vascular: Normal flow voids. Skull and upper cervical spine: Normal marrow signal. Sinuses/Orbits: Negative. Other: None. IMPRESSION: 1. Acute left ACA territory infarct with petechial hemorrhage. No significant mass effect. 2. Mild chronic microvascular ischemic changes of the white matter, similar to prior MRI. Electronically Signed   By: Pedro Earls M.D.   On: 10/15/2022 09:09   DG Ankle Complete Right  Result Date: 10/14/2022 CLINICAL DATA:  Post reduction. EXAM: RIGHT ANKLE - COMPLETE 3+ VIEW COMPARISON:  10/14/2022 FINDINGS: Fracture-dislocation of the right ankle has been reduced. The ankle is located with residual irregularity and widening between the talus and distal tibia. Again noted is a displaced oblique distal fibular fracture. The ankle is now within a cast/splint. IMPRESSION: 1. Reduction of the right  ankle fracture-dislocation. The ankle appears to be located. 2. Displaced fracture involving the distal fibula. Electronically Signed   By: Markus Daft M.D.   On: 10/14/2022 16:02   CT ANGIO HEAD NECK W WO CM  Result Date: 10/14/2022 CLINICAL DATA:  Neuro deficit, acute, stroke suspected aphasia, R sided weakness, improving EXAM: CT ANGIOGRAPHY HEAD AND NECK TECHNIQUE: Multidetector CT imaging of the head and neck was performed using the standard protocol during bolus administration of intravenous contrast. Multiplanar CT image reconstructions and MIPs were obtained to evaluate the vascular anatomy. Carotid stenosis measurements (when applicable) are obtained utilizing NASCET criteria, using the distal internal carotid diameter as the denominator. RADIATION DOSE REDUCTION: This exam was performed according to the departmental dose-optimization program which includes automated exposure control, adjustment of the mA and/or kV according to patient size and/or use of iterative reconstruction technique. CONTRAST:  94m OMNIPAQUE IOHEXOL 350 MG/ML SOLN COMPARISON:  None Available.  FINDINGS: CTA NECK FINDINGS Aortic arch: Calcific atherosclerosis. Aberrant right subclavian artery (anatomic variant) with severe stenosis of its origin. Otherwise, great vessel origins are patent. Right carotid system: No evidence of dissection, stenosis (50% or greater), or occlusion. Left carotid system: No evidence of dissection, stenosis (50% or greater), or occlusion. Vertebral arteries: Left dominant. No evidence of dissection, stenosis (50% or greater), or occlusion. Skeleton: Multilevel degenerative change. Other neck: No acute findings. Upper chest: Visualized lung apices are clear. Review of the MIP images confirms the above findings CTA HEAD FINDINGS Anterior circulation: Bilateral intracranial ICAs are patent with moderate right and mild left paraclinoid ICA stenosis. 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 Otherwise, the MCAs and ACAs are patent without proximal hemodynamically significant stenosis. Posterior circulation: Bilateral intracranial vertebral arteries, basilar artery and bilateral posterior cerebral arteries are patent without proximal hemodynamically significant stenosis. Venous sinuses: As permitted by contrast timing, patent. Review of the MIP images confirms the above findings IMPRESSION: 1. No emergent large vessel occlusion. 2. Moderate right and mild left paraclinoid ICA stenosis. 3. Aberrant right subclavian artery (anatomic variant) with severe stenosis of its origin. Electronically Signed   By: FMargaretha SheffieldM.D.   On: 10/14/2022 15:34   DG Chest Portable 1 View  Result Date: 10/14/2022 CLINICAL DATA:  Code stroke.  Fell. EXAM: PORTABLE CHEST 1 VIEW COMPARISON:  07/11/2017 FINDINGS: The cardiac silhouette, mediastinal and hilar contours are normal. Low lung volumes with vascular crowding and streaky basilar atelectasis. No infiltrates or effusions. No pneumothorax. The bony thorax is intact. IMPRESSION: Low lung volumes with vascular crowding and streaky  basilar atelectasis. Electronically Signed   By: PMarijo SanesM.D.   On: 10/14/2022 15:30   DG Ankle Right Port  Result Date: 10/14/2022 CLINICAL DATA:  [place]  Right ankle pain and deformity. EXAM: PORTABLE RIGHT ANKLE - 2 VIEW COMPARISON:  None FINDINGS: Fracture dislocation noted at the tibiotalar joint. The talus is dislocated laterally. There is an oblique coursing comminuted fracture of the distal fibular shaft. No obvious tibia fracture. The talus appears intact. The subtalar joints are maintained. The mid and hindfoot bony structures are intact. IMPRESSION: 1. Fracture dislocation at the tibiotalar joint. 2. Comminuted fracture of the distal fibular shaft. Electronically Signed   By: PMarijo SanesM.D.   On: 10/14/2022 15:29   CT HEAD CODE STROKE WO CONTRAST  Result Date: 10/14/2022 CLINICAL DATA:  Code stroke.  Right-sided weakness EXAM: CT HEAD WITHOUT CONTRAST TECHNIQUE: Contiguous axial images were obtained from the base of the skull through the vertex without intravenous  contrast. RADIATION DOSE REDUCTION: This exam was performed according to the departmental dose-optimization program which includes automated exposure control, adjustment of the mA and/or kV according to patient size and/or use of iterative reconstruction technique. COMPARISON:  MRI Brain 01/21/22 FINDINGS: Brain: No evidence of acute infarction, hemorrhage, hydrocephalus, extra-axial collection or mass lesion/mass effect.Previously seen left frontal lobe infarct is not definitively visualized on this exam. Sequela of moderate chronic microvascular ischemic change. Vascular: No hyperdense vessel or unexpected calcification. Skull: Normal. Negative for fracture or focal lesion. Sinuses/Orbits: No middle ear or mastoid effusion. Paranasal sinuses are clear. Orbits are unremarkable. Other: None. ASPECTS ([place] Stroke Program Early CT Score): 10 IMPRESSION: 1. No hemorrhage or CT evidence of an acute infarct. 2. Aspects is 10.  Electronically Signed   By: Marin Roberts M.D.   On: 10/14/2022 14:40    Family History Reviewed and non-contributory, no pertinent history of problems with bleeding or anesthesia    Review of Systems No fevers or chills No chest pain No shortness of breath No bowel or bladder dysfunction No GI distress No headaches    OBJECTIVE  Vitals:Patient Vitals for the past 8 hrs:  BP Temp Temp src Pulse Resp SpO2  10/15/22 1100 (!) 127/41 -- -- 66 13 99 %  10/15/22 1000 -- -- -- 76 10 97 %  10/15/22 0900 (!) 146/77 -- -- (!) 32 20 100 %  10/15/22 0800 (!) 154/79 (!) 97.5 F (36.4 C) Oral 75 (!) 21 100 %  10/15/22 0700 111/66 -- -- 68 18 99 %  10/15/22 0635 -- -- -- 62 13 100 %  10/15/22 0530 -- -- -- 64 17 100 %  10/15/22 0500 (!) 113/50 -- -- 65 19 98 %   General: Alert, no acute distress Cardiovascular: Warm extremities noted Respiratory: No cyanosis, no use of accessory musculature GI: No organomegaly, abdomen is soft and non-tender Skin: No lesions in the area of chief complaint other than those listed below in MSK exam.  Neurologic: Sensation intact distally save for the below mentioned MSK exam Psychiatric: Patient is competent for consent with normal mood and affect Lymphatic: No swelling obvious and reported other than the area involved in the exam below Extremities   RLE: Splint to right ankle is clean, dry and intact.  Toes warm and well-perfused.  She can wiggle her exposed toes.  Sensation is intact to her exposed toes.    Test Results Imaging  X-rays of the right ankle demonstrates a distal fibula fracture, with medial clear space widening.  Labs cbc Recent Labs    10/14/22 1430 10/14/22 1433 10/15/22 0433  WBC 8.4  --  11.6*  HGB 14.3 15.3* 13.4  HCT 43.8 45.0 41.8  PLT 284  --  227     Labs coag Recent Labs    10/14/22 1430  INR 1.0    Recent Labs    10/14/22 1430 10/14/22 1433 10/15/22 0437  NA 139 142 138  K 3.3* 3.3* 3.6  CL 104 104  106  CO2 23  --  24  GLUCOSE 104* 103* 95  BUN '20 22 14  '$ CREATININE 0.95 0.80 0.82  CALCIUM 8.7*  --  8.2*

## 2022-10-15 NOTE — Evaluation (Signed)
Physical Therapy Evaluation Patient Details Name: Jacqueline Leon MRN: [id number] DOB: November 03, 1955 Today's Date: 10/15/2022  History of Present Illness  Jacqueline Leon is a 67 y.o. female with PMH significant of HTN, HLD, recurrent dizziness reported secondary to inner ear issues, HLD.  Patient presented to the hospital after an unwitnessed event at home.  Patient and her husband were talking in the kitchen.  Patient went upstairs.  Husband heard a thud and a few minutes later he heard a second thud.  He called for his wife and she did not respond so he went upstairs and found that she was on the ground sitting upright with inability to speak.  She appeared to have injured her right foot.  911 was called.  Last known well was 1:15 PM.  Presented as a code stroke in the ER.  Had some right-sided weakness as well.  At the time of my evaluation patient does not have any headache.  Patient is able to speak and answer questions but appears to have difficulty finding her words.  No tingling or numbness reported.  No chest pain.  No abdominal pain.  No dizziness or lightheadedness.  No nausea no vomiting.  No diarrhea.  No burning no fever no chills reported.  No recent change in the medications reported.  Patient has a history of recurrent dizziness spells which her PCP has diagnosed as inner ear issues and prescribed her meclizine for that.  Last use was somewhere last month.  No alcohol abuse no drug abuse.  No history of smoking.  Neurology was consulted in the ED.  TNK was not administered as the symptoms were mild and rapidly improving.   Clinical Impression  Patient demonstrates slightly labored movement for moving RLE during bed mobility, fair/good return for keeping most body weight off right foot during transfers and when taking steps in room without loss of balance.  Patient limited mostly due to fatigue and tolerated sitting up on commode with spouse present after therapy - nurse notified.  Patient will  benefit from continued skilled physical therapy in hospital and recommended venue below to increase strength, balance, endurance for safe ADLs and gait.        Recommendations for follow up therapy are one component of a multi-disciplinary discharge planning process, led by the attending physician.  Recommendations may be updated based on patient status, additional functional criteria and insurance authorization.  Follow Up Recommendations Home health PT      Assistance Recommended at Discharge Set up Supervision/Assistance  Patient can return home with the following  A lot of help with walking and/or transfers;A little help with bathing/dressing/bathroom;Help with stairs or ramp for entrance;Assistance with Forensic psychologist cushion (measurements PT);Wheelchair (measurements PT)  Recommendations for Other Services       Functional Status Assessment Patient has had a recent decline in their functional status and demonstrates the ability to make significant improvements in function in a reasonable and predictable amount of time.     Precautions / Restrictions Precautions Precautions: Fall Required Braces or Orthoses: Splint/Cast Splint/Cast: R LE Restrictions Weight Bearing Restrictions: Yes RLE Weight Bearing: Non weight bearing      Mobility  Bed Mobility Overal bed mobility: Needs Assistance Bed Mobility: Supine to Sit     Supine to sit: Min guard, Supervision     General bed mobility comments: slightly increased time, with labored movement for moving RLE    Transfers Overall transfer level: Needs assistance Equipment used:  Rolling walker (2 wheels) Transfers: Sit to/from Stand, Bed to chair/wheelchair/BSC Sit to Stand: Min guard   Step pivot transfers: Min guard, Min assist       General transfer comment: labored movemet with good return for PWB on RLE    Ambulation/Gait Ambulation/Gait assistance: Min guard, Min  assist Gait Distance (Feet): 25 Feet Assistive device: Rolling walker (2 wheels) Gait Pattern/deviations: Decreased step length - right, Decreased step length - left, Decreased stance time - right, Antalgic, Decreased stride length Gait velocity: decreased     General Gait Details: slow labored cadence with mostly PWB on RLE withou loss of balance, limited mostly due to fatigue  Stairs            Wheelchair Mobility    Modified Rankin (Stroke Patients Only)       Balance Overall balance assessment: Needs assistance Sitting-balance support: Feet supported, No upper extremity supported Sitting balance-Leahy Scale: Good Sitting balance - Comments: seated at EOB   Standing balance support: During functional activity, Bilateral upper extremity supported, Reliant on assistive device for balance Standing balance-Leahy Scale: Fair Standing balance comment: poor to fair with RW                             Pertinent Vitals/Pain Pain Assessment Pain Assessment: 0-10 Pain Score: 8  Pain Location: R foot Pain Descriptors / Indicators: Sharp Pain Intervention(s): Limited activity within patient's tolerance, Monitored during session, Repositioned    Home Living Family/patient expects to be discharged to:: Private residence Living Arrangements: Spouse/significant other Available Help at Discharge: Family;Available 24 hours/day Type of Home: House Home Access: Stairs to enter Entrance Stairs-Rails: Can reach both Entrance Stairs-Number of Steps: 3 Alternate Level Stairs-Number of Steps: 13 Home Layout: Two level;Able to live on main level with bedroom/bathroom Home Equipment: Rolling Walker (2 wheels);Cane - single point;BSC/3in1 Additional Comments: Pt reports no change in home lving situation.    Prior Function Prior Level of Function : Independent/Modified Independent             Mobility Comments: community ambulator without AD ADLs Comments:  Independent     Hand Dominance   Dominant Hand: Right    Extremity/Trunk Assessment   Upper Extremity Assessment Upper Extremity Assessment: Defer to OT evaluation    Lower Extremity Assessment Lower Extremity Assessment: Overall WFL for tasks assessed;RLE deficits/detail RLE Deficits / Details: grossly -4/5 RLE: Unable to fully assess due to immobilization RLE Sensation: WNL RLE Coordination: WNL    Cervical / Trunk Assessment Cervical / Trunk Assessment: Normal  Communication   Communication: No difficulties  Cognition Arousal/Alertness: Awake/alert Behavior During Therapy: WFL for tasks assessed/performed Overall Cognitive Status: Within Functional Limits for tasks assessed                                          General Comments      Exercises     Assessment/Plan    PT Assessment Patient needs continued PT services  PT Problem List Decreased strength;Decreased balance;Decreased activity tolerance;Decreased mobility;Pain       PT Treatment Interventions DME instruction;Gait training;Functional mobility training;Therapeutic activities;Therapeutic exercise;Patient/family education;Balance training;Wheelchair mobility training    PT Goals (Current goals can be found in the Care Plan section)  Acute Rehab PT Goals Patient Stated Goal: return home with family to assist PT Goal Formulation: With patient/family Time  For Goal Achievement: 10/19/22 Potential to Achieve Goals: Good    Frequency Min 3X/week     Co-evaluation PT/OT/SLP Co-Evaluation/Treatment: Yes Reason for Co-Treatment: To address functional/ADL transfers PT goals addressed during session: Mobility/safety with mobility;Balance;Proper use of DME OT goals addressed during session: ADL's and self-care       AM-PAC PT "6 Clicks" Mobility  Outcome Measure Help needed turning from your back to your side while in a flat bed without using bedrails?: None Help needed moving from  lying on your back to sitting on the side of a flat bed without using bedrails?: A Little Help needed moving to and from a bed to a chair (including a wheelchair)?: A Little Help needed standing up from a chair using your arms (e.g., wheelchair or bedside chair)?: A Little Help needed to walk in hospital room?: A Lot Help needed climbing 3-5 steps with a railing? : A Lot 6 Click Score: 17    End of Session   Activity Tolerance: Patient tolerated treatment well;Patient limited by fatigue Patient left: with call bell/phone within reach;Other (comment) (left sitting on commode) Nurse Communication: Mobility status PT Visit Diagnosis: Unsteadiness on feet (R26.81);Other abnormalities of gait and mobility (R26.89);Muscle weakness (generalized) (M62.81);Pain Pain - Right/Left: Right Pain - part of body: Ankle and joints of foot    Time: [id number] PT Time Calculation (min) (ACUTE ONLY): 28 min   Charges:   PT Evaluation $PT Eval Moderate Complexity: 1 Mod PT Treatments $Therapeutic Activity: 23-37 mins        2:25 PM, 10/15/22 Lonell Grandchild, MPT Physical Therapist with [facility] [phone] office [phone] mobile phone

## 2022-10-15 NOTE — Progress Notes (Signed)
[facility] PROGRESS NOTE  Jacqueline Leon (DOB: [birth year]) [id number] PCP: Sharilyn Sites, MD  Brief Narrative: Jacqueline Leon is a 67 y.o. female with a history of CVA, dizziness, HTN, HLD who presented to the ED on 10/14/2022 after an unwitnessed fall at home with right ankle fracture which was reduced in the ED. She was also found to have difficulty speaking concerning for stroke. CT head was unremarkable. Neurology recommended MRI which does show left ACA infarct. Orthopedics and neurology are consulted.    Subjective: Feels better. Pain in the ankle is stable, but speech is more fluid. She denies syncope. No current dizziness. No focal weakness or numbness.   Objective: BP (!) 127/41   Pulse 66   Temp 97.9 F (36.6 C) (Oral)   Resp 13   Ht '5\' 4"'$  (1.626 m)   Wt 85.5 kg   SpO2 99%   BMI 32.35 kg/m   Gen: No distress Pulm: Clear, nonlabored  CV: RRR, no MRG or JVD GI: Soft, NT, ND, +BS  Neuro: Alert and oriented, slowed speech without dysarthria. No new focal deficits. Ext: Warm, dry. RLE splint in place distally sensation and motor function and cap refill intact. Skin: No other rashes, lesions or ulcers on visualized skin   Assessment & Plan: Principal Problem:   Acute CVA (cerebrovascular accident) ([place]) Active Problems:   Closed fracture of right ankle  Acute left ACA CVA: Per MRI, on background of microvascular disease   - Neurology consulted. Planning DAPT x3 weeks, then plavix monotherapy at this time. With associated petechial hemorrhage, will DC lovenox VTE ppx pending updated recommendation. She did received aspirin this Will f/u with neurology after discharge. - Echo pending - Permissive HTN for now.  - LDL is 68, continue atorvastatin '40mg'$ . HbA1c 5.2%.  - PT/OT/SLP. RLE NWB per orthopedics.  - EEG ordered at admission  Right ankle fracture: Reduced in ED 3/13.  - Orthopedics consulted, NWB RLE for now. Will need operative management, timing deferred  at this time.  - PT/OT, currently recommending home health.  HTN:  - Permissive HTN for now. Hold HCTZ.   HLD:  - Continue statin.   Hypokalemia:  - Resolved.  PVCs: Given this and unclear etiology of fall, consider outpatient cardiac monitoring after discharge.  - Echo pending.   Dizziness: - prn meclizine.   Jacqueline Pour, MD [facility] [email or web address] 10/15/2022, 1:53 PM

## 2022-10-15 NOTE — Progress Notes (Signed)
EEG complete - results pending 

## 2022-10-15 NOTE — Plan of Care (Signed)
  Problem: Acute Rehab OT Goals (only OT should resolve) Goal: Pt. Will Perform Grooming Flowsheets (Taken 10/15/2022 1204) Pt Will Perform Grooming:  with modified independence  sitting Goal: Pt. Will Perform Lower Body Bathing Flowsheets (Taken 10/15/2022 1204) Pt Will Perform Lower Body Bathing:  with min assist  sitting/lateral leans  bed level Goal: Pt. Will Perform Lower Body Dressing Flowsheets (Taken 10/15/2022 1204) Pt Will Perform Lower Body Dressing:  with min assist  sitting/lateral leans  bed level Goal: Pt. Will Transfer To Toilet Flowsheets (Taken 10/15/2022 1204) Pt Will Transfer to Toilet:  with modified independence  ambulating Goal: Pt. Will Perform Toileting-Clothing Manipulation Flowsheets (Taken 10/15/2022 1204) Pt Will Perform Toileting - Clothing Manipulation and hygiene:  with supervision  sitting/lateral leans  Edwar Coe OT, MOT

## 2022-10-15 NOTE — Progress Notes (Signed)
  Transition of Care [facility]) Screening Note   Patient Details  Name: Jacqueline Leon Date of Birth: 05/06/1956   Transition of Care [facility]) CM/SW Contact:    Ihor Gully, LCSW Phone Number: 10/15/2022, 12:55 PM    Transition of Care Department [facility]) has reviewed patient and no TOC needs have been identified at this time. We will continue to monitor patient advancement through interdisciplinary progression rounds. If new patient transition needs arise, please place a TOC consult.

## 2022-10-16 ENCOUNTER — Other Ambulatory Visit (INDEPENDENT_AMBULATORY_CARE_PROVIDER_SITE_OTHER): Payer: Medicare Other

## 2022-10-16 ENCOUNTER — Other Ambulatory Visit: Payer: Self-pay

## 2022-10-16 DIAGNOSIS — I639 Cerebral infarction, unspecified: Secondary | ICD-10-CM

## 2022-10-16 MED ORDER — CLOPIDOGREL BISULFATE 75 MG PO TABS: 75.0000 mg | ORAL_TABLET | Freq: Every day | ORAL | 2 refills | Status: DC

## 2022-10-16 MED ORDER — ASPIRIN 81 MG PO TBEC: 81.0000 mg | DELAYED_RELEASE_TABLET | Freq: Every day | ORAL | 0 refills | Status: AC

## 2022-10-16 MED ORDER — OXYCODONE-ACETAMINOPHEN 5-325 MG PO TABS: 1.0000 | ORAL_TABLET | Freq: Four times a day (QID) | ORAL | 0 refills | Status: AC | PRN

## 2022-10-16 NOTE — Care Management Important Message (Signed)
Important Message  Patient Details  Name: Jacqueline Leon MRN: [id number] Date of Birth: Apr 21, 1956   Medicare Important Message Given:  N/A - LOS <3 / Initial given by admissions     Tommy Medal 10/16/2022, 2:37 PM

## 2022-10-16 NOTE — Progress Notes (Signed)
Physical Therapy Treatment Patient Details Name: Jacqueline Leon MRN: [id number] DOB: 1956/05/09 Today's Date: 10/16/2022   History of Present Illness Jacqueline Leon is a 67 y.o. female with PMH significant of HTN, HLD, recurrent dizziness reported secondary to inner ear issues, HLD.  Patient presented to the hospital after an unwitnessed event at home.  Patient and her husband were talking in the kitchen.  Patient went upstairs.  Husband heard a thud and a few minutes later he heard a second thud.  He called for his wife and she did not respond so he went upstairs and found that she was on the ground sitting upright with inability to speak.  She appeared to have injured her right foot.  911 was called.  Last known well was 1:15 PM.  Presented as a code stroke in the ER.  Had some right-sided weakness as well.  At the time of my evaluation patient does not have any headache.  Patient is able to speak and answer questions but appears to have difficulty finding her words.  No tingling or numbness reported.  No chest pain.  No abdominal pain.  No dizziness or lightheadedness.  No nausea no vomiting.  No diarrhea.  No burning no fever no chills reported.  No recent change in the medications reported.  Patient has a history of recurrent dizziness spells which her PCP has diagnosed as inner ear issues and prescribed her meclizine for that.  Last use was somewhere last month.  No alcohol abuse no drug abuse.  No history of smoking.  Neurology was consulted in the ED.  TNK was not administered as the symptoms were mild and rapidly improving.    PT Comments    Patient demonstrates good return for moving RLE when sitting up at bedside and maintaining NWB RLE during transfers to chair using RW.  Patient tolerated 2 trials of gait training in hallway up to 30 feet without loss of balance good return for maintaining NWB RLE without loss of balance while followed with w/c for safety.  Patient tolerated sitting up in  chair with spouse present in room after therapy.  Patient will benefit from continued skilled physical therapy in hospital and recommended venue below to increase strength, balance, endurance for safe ADLs and gait.     Recommendations for follow up therapy are one component of a multi-disciplinary discharge planning process, led by the attending physician.  Recommendations may be updated based on patient status, additional functional criteria and insurance authorization.  Follow Up Recommendations  Home health PT     Assistance Recommended at Discharge Set up Supervision/Assistance  Patient can return home with the following A little help with walking and/or transfers;A little help with bathing/dressing/bathroom;Help with stairs or ramp for entrance;Assistance with Education officer, environmental cushion (measurements PT);Wheelchair (measurements PT)    Recommendations for Other Services       Precautions / Restrictions Precautions Precautions: Fall Required Braces or Orthoses: Splint/Cast Splint/Cast: R LE Restrictions Weight Bearing Restrictions: Yes RLE Weight Bearing: Non weight bearing     Mobility  Bed Mobility Overal bed mobility: Modified Independent             General bed mobility comments: good return for moving RLE during bed mobility    Transfers Overall transfer level: Needs assistance Equipment used: Rolling walker (2 wheels) Transfers: Sit to/from Stand, Bed to chair/wheelchair/BSC Sit to Stand: Supervision, Min guard   Step pivot transfers: Supervision, Min guard  General transfer comment: good return for maintaining NWB RLE during transfers using RW    Ambulation/Gait Ambulation/Gait assistance: Min guard Gait Distance (Feet): 30 Feet Assistive device: Rolling walker (2 wheels) Gait Pattern/deviations: Decreased step length - right, Decreased step length - left, Antalgic, Decreased stride length Gait velocity:  decreased     General Gait Details: demonstrates good return for maintaining NWB RLE during ambulatin in hallway using RW without loss of balance, limited mostly due to fatigue, followed with w/c for safety   Stairs             Wheelchair Mobility    Modified Rankin (Stroke Patients Only)       Balance Overall balance assessment: Needs assistance Sitting-balance support: Feet supported, No upper extremity supported Sitting balance-Leahy Scale: Good Sitting balance - Comments: seated at EOB   Standing balance support: Reliant on assistive device for balance, During functional activity, Bilateral upper extremity supported Standing balance-Leahy Scale: Fair Standing balance comment: fair/good using RW                            Cognition Arousal/Alertness: Awake/alert Behavior During Therapy: WFL for tasks assessed/performed Overall Cognitive Status: Within Functional Limits for tasks assessed                                          Exercises      General Comments        Pertinent Vitals/Pain Pain Assessment Pain Assessment: No/denies pain Pain Location: R foot    Home Living                          Prior Function            PT Goals (current goals can now be found in the care plan section) Acute Rehab PT Goals Patient Stated Goal: return home with family to assist PT Goal Formulation: With patient/family Time For Goal Achievement: 10/19/22 Potential to Achieve Goals: Good Progress towards PT goals: Progressing toward goals    Frequency    Min 3X/week      PT Plan Current plan remains appropriate    Co-evaluation PT/OT/SLP Co-Evaluation/Treatment: Yes            AM-PAC PT "6 Clicks" Mobility   Outcome Measure  Help needed turning from your back to your side while in a flat bed without using bedrails?: None Help needed moving from lying on your back to sitting on the side of a flat bed without  using bedrails?: None Help needed moving to and from a bed to a chair (including a wheelchair)?: A Little Help needed standing up from a chair using your arms (e.g., wheelchair or bedside chair)?: A Little Help needed to walk in hospital room?: A Little Help needed climbing 3-5 steps with a railing? : A Lot 6 Click Score: 19    End of Session   Activity Tolerance: Patient tolerated treatment well;Patient limited by fatigue Patient left: in chair;with call bell/phone within reach;with family/visitor present Nurse Communication: Mobility status PT Visit Diagnosis: Unsteadiness on feet (R26.81);Other abnormalities of gait and mobility (R26.89);Muscle weakness (generalized) (M62.81);Pain     Time: [id number] PT Time Calculation (min) (ACUTE ONLY): 26 min  Charges:  $Gait Training: 8-22 mins $Therapeutic Activity: 8-22 mins  12:16 PM, 10/16/22 Lonell Grandchild, MPT Physical Therapist with [facility] [phone] office [phone] mobile phone

## 2022-10-16 NOTE — Discharge Summary (Signed)
Physician Discharge Summary   Patient: Jacqueline Leon MRN: [id number] DOB: [birth year]  Admit date:     10/14/2022  Discharge date: 10/16/22  Discharge Physician: [name]   PCP: Sharilyn Sites, MD   Recommendations at discharge:  Follow up with orthopedics in 2 weeks to determine timing of operative right ankle fixation.  Follow up with neurology in 6-8 weeks post stroke admission.   Discharge Diagnoses: Principal Problem:   Acute CVA (cerebrovascular accident) [facility]) Active Problems:   Closed fracture of right ankle  Hospital Course: Jacqueline Leon is a 67 y.o. female with a history of CVA, dizziness, HTN, HLD who presented to the ED on 10/14/2022 after an unwitnessed fall at home with right ankle fracture which was reduced in the ED. She was also found to have difficulty speaking concerning for stroke. CT head was unremarkable. Neurology recommended MRI which does show left ACA infarct. Orthopedics and neurology are consulted.   Assessment and Plan: Acute left ACA CVA: Per MRI, on background of microvascular disease   - Neurology consulted. Planning DAPT x3 weeks, then plavix monotherapy at this time (PTA was on aspirin 81mg ).   - Neurology follow up after discharge (referral placed) - Echo was unremarkable, no CES. - LDL is 68, continue atorvastatin 40mg . HbA1c 5.2%.  - HHPT ordered. - 30 day zio patch monitors (2 x 14 days) ordered at DC, d/w cardiology.   Right ankle fracture: Reduced in ED 3/13.  - Orthopedics consulted, NWB RLE for now. Will need operative management, but recommends following up as outpatient in the next week. D/w Dr. Amedeo Kinsman, though pt known to Dr. Aline Brochure as well. - PT/OT, currently recommending home health.   HTN:  - Continue home Tx.   HLD:  - Continue statin.    Hypokalemia:  - Resolved.   PVCs: Given this and unclear etiology of fall, consider outpatient cardiac monitoring after discharge.  - Echo without significant structural disease.    Dizziness: - prn meclizine.   Consultants: Orthopedics, neurology Procedures performed: EEG, echo  Disposition: Home Diet recommendation: Heart healthy DISCHARGE MEDICATION: Allergies as of 10/16/2022   No Known Allergies      Medication List     STOP taking these medications    meloxicam 7.5 MG tablet Commonly known as: MOBIC       TAKE these medications    aspirin EC 81 MG tablet Take 1 tablet (81 mg total) by mouth daily for 21 days. Swallow whole.   atorvastatin 40 MG tablet Commonly known as: LIPITOR Take 40 mg by mouth daily.   cholecalciferol 1000 units tablet Commonly known as: VITAMIN D Take 1,000 Units by mouth daily.   clopidogrel 75 MG tablet Commonly known as: PLAVIX Take 1 tablet (75 mg total) by mouth daily. Start taking on: October 17, 2022   hydrochlorothiazide 12.5 MG capsule Commonly known as: MICROZIDE Take 1 capsule (12.5 mg total) by mouth daily.   meclizine 25 MG tablet Commonly known as: ANTIVERT Take 25 mg by mouth daily as needed for dizziness.   multivitamin tablet Take 1 tablet by mouth daily.   oxyCODONE-acetaminophen 5-325 MG tablet Commonly known as: PERCOCET/ROXICET Take 1 tablet by mouth every 6 (six) hours as needed for up to 5 days for moderate pain or severe pain.               Durable Medical Equipment  (From admission, onward)           Start  Ordered   10/15/22 1431  For home use only DME standard manual wheelchair with seat cushion  Once       Comments: Patient suffers from right ankle fracture requiring NWB per ortho MD which impairs their ability to perform daily activities like bathing, dressing, grooming, and toileting in the home.  A walker will not resolve issue with performing activities of daily living. A wheelchair will allow patient to safely perform daily activities. Patient can safely propel the wheelchair in the home or has a caregiver who can provide assistance. Length of need 6 months  . Accessories: elevating leg rests (ELRs), wheel locks, extensions and anti-tippers.   10/15/22 1435            Follow-up Information     [facility], [facility] Follow up.   Specialty: [place] Why: [facility] staff will call you to schedule in home physical therapy visits. Contact information: [place] [address] [phone]         Sharilyn Sites, MD Follow up.   Specialty: Family Medicine Contact information: [place] [id number] [phone]         Mordecai Rasmussen, MD Follow up.   Specialties: Orthopedic Surgery, Sports Medicine Contact information: [place]. [place] [address] [phone]         Carole Civil, MD .   Specialties: Orthopedic Surgery, Radiology Contact information: [address] [place] [address] [phone]                Discharge Exam: Jacqueline Leon Weights   10/15/22 0146  Weight: 85.5 kg  BP 105/84   Pulse 68   Temp 98.3 F (36.8 C) (Oral)   Resp (!) 33   Ht 5\' 4"  (1.626 m)   Wt 85.5 kg   SpO2 99%   BMI 32.35 kg/m   No distress RLE neurovascularly intact with decreased ankle ROM, ACE wrap c/d/i  Condition at discharge: stable  The results of significant diagnostics from this hospitalization (including imaging, microbiology, ancillary and laboratory) are listed below for reference.   Imaging Studies: ECHOCARDIOGRAM COMPLETE  Result Date: 10/15/2022    ECHOCARDIOGRAM REPORT   Patient Name:   Jacqueline Leon Date of Exam: 10/15/2022 Medical Rec #:  [id number]      Height:       64.0 in Accession #:    [id number]     Weight:       188.5 lb Date of Birth:  1956-04-20      BSA:          1.908 m Patient Age:    63 years       BP:           113/50 mmHg Patient Gender: F              HR:           70 bpm. Exam Location:  [facility] Procedure: 2D Echo, Cardiac Doppler and Color Doppler Indications:    Stroke I63.9  History:         Patient has prior history of Echocardiogram examinations, most                 recent 01/21/2022. Stroke; Risk Factors:Hypertension and                 Dyslipidemia.  Sonographer:    Alvino Chapel RCS Referring Phys: [id number] Peter  1.  Left ventricular ejection fraction, by estimation, is 60 to 65%. The left ventricle has normal function. The left ventricle has no regional wall motion abnormalities. Left ventricular diastolic parameters were normal.  2. Right ventricular systolic function is normal. The right ventricular size is normal. There is normal pulmonary artery systolic pressure. The estimated right ventricular systolic pressure is Q000111Q mmHg.  3. The mitral valve is grossly normal. Trivial mitral valve regurgitation.  4. The aortic valve is tricuspid. Aortic valve regurgitation is not visualized.  5. The inferior vena cava is normal in size with greater than 50% respiratory variability, suggesting right atrial pressure of 3 mmHg. Comparison(s): Prior images reviewed side by side. LVEF remains normal range at 60-65%. FINDINGS  Left Ventricle: Left ventricular ejection fraction, by estimation, is 60 to 65%. The left ventricle has normal function. The left ventricle has no regional wall motion abnormalities. The left ventricular internal cavity size was normal in size. There is  no left ventricular hypertrophy. Left ventricular diastolic parameters were normal. Right Ventricle: The right ventricular size is normal. No increase in right ventricular wall thickness. Right ventricular systolic function is normal. There is normal pulmonary artery systolic pressure. The tricuspid regurgitant velocity is 2.14 m/s, and  with an assumed right atrial pressure of 3 mmHg, the estimated right ventricular systolic pressure is Q000111Q mmHg. Left Atrium: Left atrial size was normal in size. Right Atrium: Right atrial size was normal in size. Pericardium: There is no evidence of pericardial effusion. Mitral  Valve: The mitral valve is grossly normal. There is mild thickening of the mitral valve leaflet(s). Trivial mitral valve regurgitation. Tricuspid Valve: The tricuspid valve is grossly normal. Tricuspid valve regurgitation is mild. Aortic Valve: The aortic valve is tricuspid. There is mild aortic valve annular calcification. Aortic valve regurgitation is not visualized. Pulmonic Valve: The pulmonic valve was grossly normal. Pulmonic valve regurgitation is trivial. Aorta: The aortic root is normal in size and structure. Venous: The inferior vena cava is normal in size with greater than 50% respiratory variability, suggesting right atrial pressure of 3 mmHg. IAS/Shunts: No atrial level shunt detected by color flow Doppler.  LEFT VENTRICLE PLAX 2D LVIDd:         4.80 cm   Diastology LVIDs:         2.60 cm   LV e' medial:    10.30 cm/s LV PW:         1.00 cm   LV E/e' medial:  9.1 LV IVS:        0.90 cm   LV e' lateral:   12.00 cm/s LVOT diam:     2.00 cm   LV E/e' lateral: 7.8 LV SV:         76 LV SV Index:   40 LVOT Area:     3.14 cm  RIGHT VENTRICLE RV S prime:     13.40 cm/s TAPSE (M-mode): 3.0 cm LEFT ATRIUM             Index        RIGHT ATRIUM           Index LA diam:        3.30 cm 1.73 cm/m   RA Area:     21.00 cm LA Vol (A2C):   49.4 ml 25.89 ml/m  RA Volume:   60.30 ml  31.61 ml/m LA Vol (A4C):   54.9 ml 28.78 ml/m LA Biplane Vol: 53.0 ml 27.78 ml/m  AORTIC VALVE LVOT Vmax:   108.00  cm/s LVOT Vmean:  72.200 cm/s LVOT VTI:    0.243 m  AORTA Ao Root diam: 3.20 cm MITRAL VALVE               TRICUSPID VALVE MV Area (PHT): 4.39 cm    TR Peak grad:   18.3 mmHg MV Decel Time: 173 msec    TR Vmax:        214.00 cm/s MV E velocity: 93.80 cm/s MV A velocity: 75.80 cm/s  SHUNTS MV E/A ratio:  1.24        Systemic VTI:  0.24 m                            Systemic Diam: 2.00 cm Rozann Lesches MD Electronically signed by Rozann Lesches MD Signature Date/Time: 10/15/2022/2:07:14 PM    Final    EEG adult  Result  Date: 10/15/2022 Lora Havens, MD     10/15/2022  4:51 PM Patient Name: Jacqueline Leon MRN: [id number] Epilepsy Attending: Lora Havens Referring Physician/Provider: Lavina Hamman, MD Date: 10/15/2022 Duration: 22.42 mins Patient history: 67yo F with transient speech disturbance. EEG to evaluate for seizure Level of alertness: Awake, asleep AEDs during EEG study: None Technical aspects: This EEG study was done with scalp electrodes positioned according to the 10-20 International system of electrode placement. Electrical activity was reviewed with band pass filter of 1-70Hz , sensitivity of 7 uV/mm, display speed of 63mm/sec with a 60Hz  notched filter applied as appropriate. EEG data were recorded continuously and digitally stored.  Video monitoring was available and reviewed as appropriate. Description: The posterior dominant rhythm consists of 9-10 Hz activity of moderate voltage (25-35 uV) seen predominantly in posterior head regions, symmetric and reactive to eye opening and eye closing. Sleep was characterized by vertex waves, sleep spindles (12 to 14 Hz), maximal frontocentral region. Physiologic photic driving was not seen during photic stimulation.  Hyperventilation was not performed.   IMPRESSION: This study is within normal limits. No seizures or epileptiform discharges were seen throughout the recording. A normal interictal EEG does not exclude the diagnosis of epilepsy. Lora Havens   MR BRAIN WO CONTRAST  Result Date: 10/15/2022 CLINICAL DATA:  Neuro deficit, acute, stroke suspected. EXAM: MRI HEAD WITHOUT CONTRAST TECHNIQUE: Multiplanar, multiecho pulse sequences of the brain and surrounding structures were obtained without intravenous contrast. COMPARISON:  Head CT October 14, 2022. MRI of the brain January 22, 2019. FINDINGS: Brain: Area of restricted diffusion involving the superior/medial left frontal lobe consistent with left ACA acute territory infarct. Petechial hemorrhage is seen at  the medial cortical ribbon (HI1). No significant mass effect. No hydrocephalus, extra-axial collection or mass lesion. Scattered foci of T2 hyperintensity are seen within the white matter of the cerebral hemispheres, nonspecific, most likely related to chronic small vessel ischemia, similar to prior MRI. Vascular: Normal flow voids. Skull and upper cervical spine: Normal marrow signal. Sinuses/Orbits: Negative. Other: None. IMPRESSION: 1. Acute left ACA territory infarct with petechial hemorrhage. No significant mass effect. 2. Mild chronic microvascular ischemic changes of the white matter, similar to prior MRI. Electronically Signed   By: Pedro Earls M.D.   On: 10/15/2022 09:09   DG Ankle Complete Right  Result Date: 10/14/2022 CLINICAL DATA:  Post reduction. EXAM: RIGHT ANKLE - COMPLETE 3+ VIEW COMPARISON:  10/14/2022 FINDINGS: Fracture-dislocation of the right ankle has been reduced. The ankle is located with residual irregularity and widening between the talus  and distal tibia. Again noted is a displaced oblique distal fibular fracture. The ankle is now within a cast/splint. IMPRESSION: 1. Reduction of the right ankle fracture-dislocation. The ankle appears to be located. 2. Displaced fracture involving the distal fibula. Electronically Signed   By: Markus Daft M.D.   On: 10/14/2022 16:02   CT ANGIO HEAD NECK W WO CM  Result Date: 10/14/2022 CLINICAL DATA:  Neuro deficit, acute, stroke suspected aphasia, R sided weakness, improving EXAM: CT ANGIOGRAPHY HEAD AND NECK TECHNIQUE: Multidetector CT imaging of the head and neck was performed using the standard protocol during bolus administration of intravenous contrast. Multiplanar CT image reconstructions and MIPs were obtained to evaluate the vascular anatomy. Carotid stenosis measurements (when applicable) are obtained utilizing NASCET criteria, using the distal internal carotid diameter as the denominator. RADIATION DOSE REDUCTION: This  exam was performed according to the departmental dose-optimization program which includes automated exposure control, adjustment of the mA and/or kV according to patient size and/or use of iterative reconstruction technique. CONTRAST:  60mL OMNIPAQUE IOHEXOL 350 MG/ML SOLN COMPARISON:  None Available. FINDINGS: CTA NECK FINDINGS Aortic arch: Calcific atherosclerosis. Aberrant right subclavian artery (anatomic variant) with severe stenosis of its origin. Otherwise, great vessel origins are patent. Right carotid system: No evidence of dissection, stenosis (50% or greater), or occlusion. Left carotid system: No evidence of dissection, stenosis (50% or greater), or occlusion. Vertebral arteries: Left dominant. No evidence of dissection, stenosis (50% or greater), or occlusion. Skeleton: Multilevel degenerative change. Other neck: No acute findings. Upper chest: Visualized lung apices are clear. Review of the MIP images confirms the above findings CTA HEAD FINDINGS Anterior circulation: Bilateral intracranial ICAs are patent with moderate right and mild left paraclinoid ICA stenosis. 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 Otherwise, the MCAs and ACAs are patent without proximal hemodynamically significant stenosis. Posterior circulation: Bilateral intracranial vertebral arteries, basilar artery and bilateral posterior cerebral arteries are patent without proximal hemodynamically significant stenosis. Venous sinuses: As permitted by contrast timing, patent. Review of the MIP images confirms the above findings IMPRESSION: 1. No emergent large vessel occlusion. 2. Moderate right and mild left paraclinoid ICA stenosis. 3. Aberrant right subclavian artery (anatomic variant) with severe stenosis of its origin. Electronically Signed   By: Margaretha Sheffield M.D.   On: 10/14/2022 15:34   DG Chest Portable 1 View  Result Date: 10/14/2022 CLINICAL DATA:  Code stroke.  Fell. EXAM: PORTABLE CHEST 1 VIEW COMPARISON:   07/11/2017 FINDINGS: The cardiac silhouette, mediastinal and hilar contours are normal. Low lung volumes with vascular crowding and streaky basilar atelectasis. No infiltrates or effusions. No pneumothorax. The bony thorax is intact. IMPRESSION: Low lung volumes with vascular crowding and streaky basilar atelectasis. Electronically Signed   By: Marijo Sanes M.D.   On: 10/14/2022 15:30   DG Ankle Right Port  Result Date: 10/14/2022 CLINICAL DATA:  [place].  Right ankle pain and deformity. EXAM: PORTABLE RIGHT ANKLE - 2 VIEW COMPARISON:  None FINDINGS: Fracture dislocation noted at the tibiotalar joint. The talus is dislocated laterally. There is an oblique coursing comminuted fracture of the distal fibular shaft. No obvious tibia fracture. The talus appears intact. The subtalar joints are maintained. The mid and hindfoot bony structures are intact. IMPRESSION: 1. Fracture dislocation at the tibiotalar joint. 2. Comminuted fracture of the distal fibular shaft. Electronically Signed   By: Marijo Sanes M.D.   On: 10/14/2022 15:29   CT HEAD CODE STROKE WO CONTRAST  Result Date: 10/14/2022 CLINICAL DATA:  Code stroke.  Right-sided weakness EXAM: CT HEAD WITHOUT CONTRAST TECHNIQUE: Contiguous axial images were obtained from the base of the skull through the vertex without intravenous contrast. RADIATION DOSE REDUCTION: This exam was performed according to the departmental dose-optimization program which includes automated exposure control, adjustment of the mA and/or kV according to patient size and/or use of iterative reconstruction technique. COMPARISON:  MRI Brain 01/21/22 FINDINGS: Brain: No evidence of acute infarction, hemorrhage, hydrocephalus, extra-axial collection or mass lesion/mass effect.Previously seen left frontal lobe infarct is not definitively visualized on this exam. Sequela of moderate chronic microvascular ischemic change. Vascular: No hyperdense vessel or unexpected calcification. Skull: Normal.  Negative for fracture or focal lesion. Sinuses/Orbits: No middle ear or mastoid effusion. Paranasal sinuses are clear. Orbits are unremarkable. Other: None. ASPECTS ([place] Stroke Program Early CT Score): 10 IMPRESSION: 1. No hemorrhage or CT evidence of an acute infarct. 2. Aspects is 10. Electronically Signed   By: Marin Roberts M.D.   On: 10/14/2022 14:40    Microbiology: Results for orders placed or performed during the hospital encounter of 10/14/22  MRSA Next Gen by PCR, Nasal     Status: None   Collection Time: 10/15/22 12:04 AM   Specimen: Nasal Mucosa; Nasal Swab  Result Value Ref Range Status   MRSA by PCR Next Gen NOT DETECTED NOT DETECTED Final    Comment: (NOTE) The GeneXpert MRSA Assay (FDA approved for NASAL specimens only), is one component of a comprehensive MRSA colonization surveillance program. It is not intended to diagnose MRSA infection nor to guide or monitor treatment for MRSA infections. Test performance is not FDA approved in patients less than 47 years old. Performed at [facility], [address] [place], [place] [address]     Labs: CBC: Recent Labs  Lab 10/14/22 1430 10/14/22 1433 10/15/22 0433  WBC 8.4  --  11.6*  NEUTROABS 4.3  --  8.3*  HGB 14.3 15.3* 13.4  HCT 43.8 45.0 41.8  MCV 92.8  --  94.6  PLT 284  --  Q000111Q   Basic Metabolic Panel: Recent Labs  Lab 10/14/22 1430 10/14/22 1433 10/15/22 0437  NA 139 142 138  K 3.3* 3.3* 3.6  CL 104 104 106  CO2 23  --  24  GLUCOSE 104* 103* 95  BUN 20 22 14   CREATININE 0.95 0.80 0.82  CALCIUM 8.7*  --  8.2*  MG 2.0  --  2.0   Liver Function Tests: Recent Labs  Lab 10/14/22 1430 10/15/22 0437  AST 44* 30  ALT 32 25  ALKPHOS 64 53  BILITOT 1.0 0.9  PROT 7.8 6.4*  ALBUMIN 3.8 3.2*   CBG: Recent Labs  Lab 10/14/22 1428  GLUCAP 116*    Discharge time spent: greater than 30 minutes.  Signed: [name], MD [facility] 10/16/2022

## 2022-10-16 NOTE — TOC Transition Note (Addendum)
Transition of Care [facility]) - CM/SW Discharge Note   Patient Details  Name: Jacqueline Leon MRN: [id number] Date of Birth: Oct 04, 1955  Transition of Care [facility]) CM/SW Contact:  Shade Flood, LCSW Phone Number: 10/16/2022, 10:24 AM   Clinical Narrative:     Per MD, pt stable for dc home today with HHPT and W/c as long as pt not recommended for surgery prior to dc.   Confirmed wheelchair has been delivered to pt's room. Asked MD for HHPT order. Updated Cory at [place].   If pt does end up needing surgery, TOC will follow up tomorrow.  Final next level of care: Home w Home Health Services Barriers to Discharge: Barriers Resolved   Patient Goals and CMS Choice CMS Medicare.gov Compare Post Acute Care list provided to:: Patient Choice offered to / list presented to : Patient  Discharge Placement                         Discharge Plan and Services Additional resources added to the After Visit Summary for                  DME Arranged: Wheelchair manual DME Agency: [employer] Date DME Agency Contacted: 10/15/22   Representative spoke with at DME Agency: Erasmo Downer HH Arranged: PT [place]: [place] Date [place]: 10/15/22   Representative spoke with at [place]: [place] ([place]) Interventions SDOH Screenings   Food Insecurity: No Food Insecurity (10/15/2022)  Housing: Low Risk  (10/15/2022)  Transportation Needs: No Transportation Needs (10/15/2022)  Utilities: Not At Risk (10/15/2022)  Alcohol Screen: Low Risk  (03/03/2022)  Depression (PHQ2-9): Low Risk  (03/03/2022)  Financial Resource Strain: Low Risk  (03/03/2022)  Physical Activity: Insufficiently Active (03/03/2022)  Social Connections: Moderately Integrated (03/03/2022)  Stress: No Stress Concern Present (03/03/2022)  Tobacco Use: Medium Risk (10/14/2022)     Readmission Risk Interventions     No data to display

## 2022-10-19 ENCOUNTER — Telehealth (HOSPITAL_BASED_OUTPATIENT_CLINIC_OR_DEPARTMENT_OTHER): Payer: Self-pay | Admitting: Internal Medicine

## 2022-10-19 ENCOUNTER — Ambulatory Visit: Payer: Medicare Other | Admitting: Orthopedic Surgery

## 2022-10-19 ENCOUNTER — Encounter: Payer: Self-pay | Admitting: Orthopedic Surgery

## 2022-10-19 VITALS — BP 150/86 | HR 113

## 2022-10-19 DIAGNOSIS — S92101D Unspecified fracture of right talus, subsequent encounter for fracture with routine healing: Secondary | ICD-10-CM | POA: Diagnosis not present

## 2022-10-19 DIAGNOSIS — I639 Cerebral infarction, unspecified: Secondary | ICD-10-CM

## 2022-10-19 DIAGNOSIS — E785 Hyperlipidemia, unspecified: Secondary | ICD-10-CM | POA: Diagnosis not present

## 2022-10-19 DIAGNOSIS — S82891A Other fracture of right lower leg, initial encounter for closed fracture: Secondary | ICD-10-CM

## 2022-10-19 DIAGNOSIS — I69328 Other speech and language deficits following cerebral infarction: Secondary | ICD-10-CM | POA: Diagnosis not present

## 2022-10-19 DIAGNOSIS — I10 Essential (primary) hypertension: Secondary | ICD-10-CM | POA: Diagnosis not present

## 2022-10-19 DIAGNOSIS — S82841D Displaced bimalleolar fracture of right lower leg, subsequent encounter for closed fracture with routine healing: Secondary | ICD-10-CM | POA: Diagnosis not present

## 2022-10-19 NOTE — Telephone Encounter (Signed)
I also reviewed d/c in the chart, which d/c summary does not even state to follow up with cardiology. I will send back to requesting office if they are referring to cardiology they need to send over a referral before we can then schedule a new pt appt in office.

## 2022-10-19 NOTE — Telephone Encounter (Signed)
Preoperative team,  I do not see where this patient has ever been seen by Cardiology. It appears she was recently in the hospital for neuro concerns and underwent some cardiac testing in relation but I cannot find a consult note from Cardiology. If preoperative clearance is needed, patient will need to be scheduled for a new patient visit in the office.   Thank you!  Justice [place]. Deane Wattenbarger, DNP, NP-C  10/19/2022, 12:30 PM [place] [address] Office [phone] Fax [phone]

## 2022-10-19 NOTE — Telephone Encounter (Signed)
I need a cardiology consult

## 2022-10-19 NOTE — Telephone Encounter (Addendum)
Per Dr. Arther Abbott, he is needing a cardiology consult. Requesting office will need to send over referral for new pt appt. Once we have referral we can proceed with a new pt appt.

## 2022-10-19 NOTE — Telephone Encounter (Signed)
Per Leah Newnam:  I left a VM for the pt to c/b.

## 2022-10-19 NOTE — Progress Notes (Signed)
Chief Complaint  Patient presents with   new problem   Ankle Injury    RT ankle/ fracture/reduced in the ED DOI 10/14/22 had a fainting spell   67 year old female with history of prior issues with her cerebrovascular system with prior MR angio in June 2023 presents after TIA with MRI documented stroke and right ankle fracture dislocation which was reduced in the emergency room.  She was admitted to the hospital May 13 discharged May 15 with neurology appointment scheduled for 8 weeks and a heart monitor was ordered.  The chart is completely unclear as to who the patient is supposed to be following up with.  She is an appropriate splint she is neurovascularly intact we will obviously need further collaboration with cardiology and neurology regarding surgery.  She is currently on aspirin and Plavix.  Dr. Amedeo Kinsman did the initial consultation.  The patient had successful ORIF left ankle by me in 2018   IMPRESSION: 1. Acute left ACA territory infarct with petechial hemorrhage. No significant mass effect. 2. Mild chronic microvascular ischemic changes of the white matter, similar to prior MRI.     Electronically Signed   By: Pedro Earls M.D.   On: 10/15/2022 09:09   Assessment and plan  67 year old female with recent stroke.  Unfortunately she has a right ankle fracture diagnosed acutely on October 14, 2022.  She will need to get neurology and cardiology clearance  These are the names that I see in the chart   Lora Havens, MD  Physician Neurology   Care Plan    Signed   Date of Service: 10/15/2022  4:59 PM   Signed      MRI brain reviewed with Dr Leonie Man. OK to continue DAPT . Also recommend 30 day event monitor.  F/u with neuro ( order placed).   Discussed plan with Dr Bonner Puna           Ordering Provider: Chalmers Guest, MD  Clinical Indications: Acute CVA (cerebrovascular accident) ([place]) [I63.9 (ICD-10-CM)]    We will try to make the  appointments for her consultations today to expedite scheduling the surgery.  Addendum we called several doctors and we are awaiting their input.  As soon as we can get the medical side of this taking care of we can schedule surgery on the right ankle

## 2022-10-19 NOTE — Telephone Encounter (Signed)
Put in referral.  

## 2022-10-19 NOTE — Addendum Note (Signed)
Addended byCandice Camp on: 10/19/2022 01:52 PM   Modules accepted: Orders

## 2022-10-19 NOTE — Telephone Encounter (Signed)
   Pre-operative Risk Assessment    Patient Name: Jacqueline Leon  DOB: 1955/11/20 MRN: [id number]      Request for Surgical Clearance    Procedure:  Dental Extraction - Amount of Teeth to be Pulled:  open reduction right ankle fracture repair   Date of Surgery:  Clearance TBD                                 Surgeon:  Dr. Sidney Ace Group or Practice Name:  [facility] nurse Phone number:  [phone] Fax number:  [phone]   Type of Clearance Requested:   - Medical    Type of Anesthesia:  General    Additional requests/questions:      SignedMilbert Coulter   10/19/2022, 10:46 AM

## 2022-10-19 NOTE — Telephone Encounter (Signed)
I will update our chart prep team and scheduling team.

## 2022-10-20 NOTE — Telephone Encounter (Signed)
YOU ARE THE BOM[facility] THANK U

## 2022-10-20 NOTE — Telephone Encounter (Signed)
Pt has new pt appt with Dr. Harl Bowie 10/22/22. I will update all parties involved.

## 2022-10-20 NOTE — Telephone Encounter (Signed)
Dr. Aline Brochure you are very much welcome sir.

## 2022-10-20 NOTE — Telephone Encounter (Signed)
Patient scheduled for 03/21 at 4pm with Dr. Phineas Inches - FYI

## 2022-10-22 ENCOUNTER — Encounter: Payer: Self-pay | Admitting: Internal Medicine

## 2022-10-22 ENCOUNTER — Ambulatory Visit: Payer: Medicare Other | Attending: Internal Medicine | Admitting: Internal Medicine

## 2022-10-22 VITALS — BP 126/64 | HR 64 | Ht 64.0 in | Wt 182.0 lb

## 2022-10-22 DIAGNOSIS — I639 Cerebral infarction, unspecified: Secondary | ICD-10-CM

## 2022-10-22 NOTE — Patient Instructions (Signed)
Medication Instructions:  Your physician recommends that you continue on your current medications as directed. Please refer to the Current Medication list given to you today.  *If you need a refill on your cardiac medications before your next appointment, please call your pharmacy*   Lab Work: NONE If you have labs (blood work) drawn today and your tests are completely normal, you will receive your results only by: [place] (if you have MyChart) OR A paper copy in the mail If you have any lab test that is abnormal or we need to change your treatment, we will call you to review the results.   Testing/Procedures: NONE   Follow-Up: At [facility], you and your health needs are our priority.  As part of our continuing mission to provide you with exceptional heart care, we have created designated Provider Care Teams.  These Care Teams include your primary Cardiologist (physician) and Advanced Practice Providers (APPs -  Physician Assistants and Nurse Practitioners) who all work together to provide you with the care you need, when you need it.  We recommend signing up for the patient portal called "MyChart".  Sign up information is provided on this After Visit Summary.  MyChart is used to connect with patients for Virtual Visits (Telemedicine).  Patients are able to view lab/test results, encounter notes, upcoming appointments, etc.  Non-urgent messages can be sent to your provider as well.   To learn more about what you can do with MyChart, go to [email or web address].    Your next appointment:   As needed pending monitor results   Provider:   Janina Mayo, MD

## 2022-10-22 NOTE — Progress Notes (Signed)
Cardiology Office Note:    Date:  10/22/2022   ID:  Jacqueline Leon, DOB 1956-04-03, MRN [id number]  PCP:  Sharilyn Sites, MD   [place] [facility] Cardiologist:  Janina Mayo, MD     Referring MD: Sharilyn Sites, MD   No chief complaint on file. CVA  History of Present Illness:    Jacqueline Leon is a 67 y.o. female with a hx of HLD, HTN, left ankle fx s/p ORIF in 2019, TIA 2023 with negative cardiac w/u referral from Dr. Hilma Favors post CVA 10/14/2022-10/16/2022  Per the DC summary she presented to the ED on 10/14/2022 after an unwitnessed fall at home with right ankle fracture which was reduced in the ED. She notes she was unclear on what happened, but passed out.She was also found to have difficulty speaking concerning for stroke. CT head was unremarkable. Neurology recommended MRI which does show left ACA infarct.  Her echo 10/15/2022 showed normal LV/RV function. No etiology for CVA.  Planned for DAPT for 3 weeks, then plavix. A1c 5.2%. LDL 68.  In 01/2022, she was admitted with signs of a TIA. She had an MRI showing small acute infarct in the posterior left frontal lobe. Echo with bubble in 2023 is negative for shunt. She had a 30-day cardiac monitor at that time that was negative for afib.  Today, she denies angina, dyspnea on exertion, lower extremity edema, PND or orthopnea. No syncope. Was able to do >4 Mets before the fall. No report of palpitations. EKG shows PVCs.  Family Hx: maternal grandmother had a stroke. No premature CAD  Social hx: here with her husband. She denies hx of smoking  Past Medical History:  Diagnosis Date   Abnormal Pap smear    History of cervical cancer    Hyperlipidemia    Hypertension    Inner ear inflammation    Mini stroke 01/2022   Vaginal Pap smear, abnormal     Past Surgical History:  Procedure Laterality Date   ABDOMINAL HYSTERECTOMY     ORIF ANKLE FRACTURE Left 07/12/2017   Procedure: OPEN REDUCTION INTERNAL FIXATION (ORIF)  ANKLE FRACTURE;  Surgeon: Carole Civil, MD;  Location: [facility];  Service: Orthopedics;  Laterality: Left;    Current Medications: Current Outpatient Medications on File Prior to Visit  Medication Sig Dispense Refill   aspirin EC 81 MG tablet Take 1 tablet (81 mg total) by mouth daily for 21 days. Swallow whole. 21 tablet 0   atorvastatin (LIPITOR) 40 MG tablet Take 40 mg by mouth daily.     cholecalciferol (VITAMIN D) 1000 UNITS tablet Take 1,000 Units by mouth daily.     clopidogrel (PLAVIX) 75 MG tablet Take 1 tablet (75 mg total) by mouth daily. 30 tablet 2   hydrochlorothiazide (MICROZIDE) 12.5 MG capsule Take 1 capsule (12.5 mg total) by mouth daily. 90 capsule 4   meclizine (ANTIVERT) 25 MG tablet Take 25 mg by mouth daily as needed for dizziness.     Multiple Vitamin (MULTIVITAMIN) tablet Take 1 tablet by mouth daily.     No current facility-administered medications on file prior to visit.     Allergies:   Patient has no known allergies.   Social History   Socioeconomic History   Marital status: Married    Spouse name: Not on file   Number of children: Not on file   Years of education: Not on file   Highest education level: Not on file  Occupational History  Not on file  Tobacco Use   Smoking status: Former    Types: Cigarettes    Quit date: 08/03/2006    Years since quitting: 16.2   Smokeless tobacco: Never  Vaping Use   Vaping Use: Never used  Substance and Sexual Activity   Alcohol use: Yes    Comment: occassional   Drug use: No   Sexual activity: Yes    Birth control/protection: Surgical    Comment: hyst  Other Topics Concern   Not on file  Social History Narrative   Not on file   Social Determinants of Health   Financial Resource Strain: Low Risk  (03/03/2022)   Overall Financial Resource Strain (CARDIA)    Difficulty of Paying Living Expenses: Not hard at all  Food Insecurity: No Food Insecurity (10/15/2022)   Hunger Vital Sign    Worried About  Running Out of Food in the Last Year: Never true    Ran Out of Food in the Last Year: Never true  Transportation Needs: No Transportation Needs (10/15/2022)   PRAPARE - Hydrologist (Medical): No    Lack of Transportation (Non-Medical): No  Physical Activity: Insufficiently Active (03/03/2022)   Exercise Vital Sign    Days of Exercise per Week: 2 days    Minutes of Exercise per Session: 30 min  Stress: No Stress Concern Present (03/03/2022)   [place]    Feeling of Stress : Not at all  Social Connections: Moderately Integrated (03/03/2022)   Social Connection and Isolation Panel [NHANES]    Frequency of Communication with Friends and Family: More than three times a week    Frequency of Social Gatherings with Friends and Family: Three times a week    Attends Religious Services: More than 4 times per year    Active Member of Clubs or Organizations: No    Attends Archivist Meetings: Never    Marital Status: Married     Family History: The patient's family history includes Hypertension in her father, maternal grandfather, and mother; Kidney disease in her brother; Other in her brother, daughter, and mother.  ROS:   Please see the history of present illness.     All other systems reviewed and are negative.  EKGs/Labs/Other Studies Reviewed:    The following studies were reviewed today:   EKG:  EKG is  ordered today.  The ekg ordered today demonstrates   10/22/2022-Sinus tachycardia with PVCs  Recent Labs: 10/15/2022: ALT 25; BUN 14; Creatinine, Ser 0.82; Hemoglobin 13.4; Magnesium 2.0; Platelets 227; Potassium 3.6; Sodium 138; TSH 2.703  Recent Lipid Panel    Component Value Date/Time   CHOL 124 10/15/2022 0437   CHOL 182 10/21/2020 1453   TRIG 34 10/15/2022 0437   HDL 49 10/15/2022 0437   HDL 68 10/21/2020 1453   CHOLHDL 2.5 10/15/2022 0437   VLDL 7 10/15/2022 0437    LDLCALC 68 10/15/2022 0437   LDLCALC 104 (H) 10/21/2020 1453     Risk Assessment/Calculations:          Physical Exam:    VS:   Vitals:   10/22/22 1545  BP: 126/64  Pulse: 64  SpO2: 99%    Wt Readings from Last 3 Encounters:  10/22/22 182 lb (82.6 kg)  10/15/22 188 lb 7.9 oz (85.5 kg)  07/06/22 186 lb 9.6 oz (84.6 kg)     GEN: Sitting in a wheelchair, Well nourished, well developed in no  acute distress HEENT: Normal NECK: No JVD; No carotid bruits LYMPHATICS: No lymphadenopathy CARDIAC: regular rhythm, ectopy, no murmurs, rubs, gallops RESPIRATORY:  Clear to auscultation without rales, wheezing or rhonchi  ABDOMEN: Soft, non-tender, non-distended MUSCULOSKELETAL:  No edema; No deformity  SKIN: Warm and dry NEUROLOGIC:  Alert and oriented x 3 PSYCHIATRIC:  Normal affect   ASSESSMENT:    CVA: pending event monitor. Prior monitor did not show afib. She had an echo with bubble in 2023 showing no PFO. Otherwise, she can continue her medications. Planned for plavix monotherapy soon with prior TIA.  Blood pressure is well controlled. Continue lipitor, LDL is at goal.  Pre-op -Can discuss with neurology about when she can hold plavix for her ankle fx repair. She is an acceptable cardiac risk for open reduction right ankle fracture repair. PLAN:    In order of problems listed above:  She is acceptable cardiac risk for open reduction right ankle fracture repair Follow up pending results        Medication Adjustments/Labs and Tests Ordered: Current medicines are reviewed at length with the patient today.  Concerns regarding medicines are outlined above.  No orders of the defined types were placed in this encounter.  No orders of the defined types were placed in this encounter.   Patient Instructions  Medication Instructions:  Your physician recommends that you continue on your current medications as directed. Please refer to the Current Medication list given to  you today.  *If you need a refill on your cardiac medications before your next appointment, please call your pharmacy*   Lab Work: NONE If you have labs (blood work) drawn today and your tests are completely normal, you will receive your results only by: [place] (if you have MyChart) OR A paper copy in the mail If you have any lab test that is abnormal or we need to change your treatment, we will call you to review the results.   Testing/Procedures: NONE   Follow-Up: At [facility], you and your health needs are our priority.  As part of our continuing mission to provide you with exceptional heart care, we have created designated Provider Care Teams.  These Care Teams include your primary Cardiologist (physician) and Advanced Practice Providers (APPs -  Physician Assistants and Nurse Practitioners) who all work together to provide you with the care you need, when you need it.  We recommend signing up for the patient portal called "MyChart".  Sign up information is provided on this After Visit Summary.  MyChart is used to connect with patients for Virtual Visits (Telemedicine).  Patients are able to view lab/test results, encounter notes, upcoming appointments, etc.  Non-urgent messages can be sent to your provider as well.   To learn more about what you can do with MyChart, go to [email or web address].    Your next appointment:   As needed pending monitor results   Provider:   Janina Mayo, MD      Signed, Janina Mayo, MD  10/22/2022 5:08 PM    [place]

## 2022-10-23 DIAGNOSIS — I639 Cerebral infarction, unspecified: Secondary | ICD-10-CM | POA: Diagnosis not present

## 2022-10-23 NOTE — Addendum Note (Signed)
Addended by: Linton Ham on: 10/23/2022 10:56 AM   Modules accepted: Orders

## 2022-10-24 DIAGNOSIS — I639 Cerebral infarction, unspecified: Secondary | ICD-10-CM | POA: Diagnosis not present

## 2022-10-28 ENCOUNTER — Other Ambulatory Visit: Payer: Self-pay

## 2022-10-28 NOTE — Telephone Encounter (Signed)
Oxycodone-Acetaminophen 5/325 MG  Qty 20 Tablets  Take 1 tablet by mouth every 6 (six) hours as needed for up to 5 days for moderate pain or severe pain.   PATIENT USES [facility]

## 2022-10-29 ENCOUNTER — Other Ambulatory Visit: Payer: Self-pay | Admitting: Orthopedic Surgery

## 2022-10-29 DIAGNOSIS — I10 Essential (primary) hypertension: Secondary | ICD-10-CM | POA: Diagnosis not present

## 2022-10-29 DIAGNOSIS — E785 Hyperlipidemia, unspecified: Secondary | ICD-10-CM | POA: Diagnosis not present

## 2022-10-29 DIAGNOSIS — S82891A Other fracture of right lower leg, initial encounter for closed fracture: Secondary | ICD-10-CM

## 2022-10-29 DIAGNOSIS — S82841D Displaced bimalleolar fracture of right lower leg, subsequent encounter for closed fracture with routine healing: Secondary | ICD-10-CM | POA: Diagnosis not present

## 2022-10-29 DIAGNOSIS — S92101D Unspecified fracture of right talus, subsequent encounter for fracture with routine healing: Secondary | ICD-10-CM | POA: Diagnosis not present

## 2022-10-29 DIAGNOSIS — I69328 Other speech and language deficits following cerebral infarction: Secondary | ICD-10-CM | POA: Diagnosis not present

## 2022-10-29 MED ORDER — HYDROCODONE-ACETAMINOPHEN 10-325 MG PO TABS: 1.0000 | ORAL_TABLET | Freq: Four times a day (QID) | ORAL | 0 refills | Status: AC | PRN

## 2022-10-29 NOTE — Progress Notes (Signed)
Meds ordered this encounter  Medications   HYDROcodone-acetaminophen (NORCO) 10-325 MG tablet    Sig: Take 1 tablet by mouth every 6 (six) hours as needed for up to 5 days.    Dispense:  20 tablet    Refill:  0

## 2022-11-02 ENCOUNTER — Telehealth: Payer: Self-pay | Admitting: Orthopedic Surgery

## 2022-11-02 NOTE — Telephone Encounter (Signed)
Patient called and wants to know why she did not get  oxyCODONE-acetaminophen    But was called in HYDROcodone-acetaminophen (NORCO) 10-325 MG tablet    Please call her back and let her know why

## 2022-11-03 ENCOUNTER — Encounter: Payer: Medicare Other | Admitting: Orthopedic Surgery

## 2022-11-04 ENCOUNTER — Ambulatory Visit: Payer: Medicare Other | Admitting: Neurology

## 2022-11-04 ENCOUNTER — Encounter: Payer: Self-pay | Admitting: Neurology

## 2022-11-04 VITALS — BP 140/80 | HR 65 | Ht 64.0 in | Wt 182.1 lb

## 2022-11-04 DIAGNOSIS — I639 Cerebral infarction, unspecified: Secondary | ICD-10-CM | POA: Diagnosis not present

## 2022-11-04 DIAGNOSIS — I63522 Cerebral infarction due to unspecified occlusion or stenosis of left anterior cerebral artery: Secondary | ICD-10-CM

## 2022-11-04 NOTE — Progress Notes (Signed)
[facility] [address] [place]. [place] [address] [phone]       OFFICE CONSULT NOTE  Ms. Jacqueline Leon Date of Birth:  Jun 05, 1956 Medical Record Number:  [id number]   Referring MD: Arther Abbott  Reason for Referral: Stroke  HPI: [name] is a pleasant 67 year old African-American lady seen today for initial office consultation visit for stroke.  She is accompanied by her husband.  History is obtained from them and review of electronic medical records.  I have also personally reviewed pertinent available imaging films in PACS.Jacqueline Leon is a 67 y.o. female with a history of CVA, dizziness, HTN, HLD who presented to the [facility] ED on 10/14/2022 after an unwitnessed fall at home with right ankle fracture which was reduced in the ED. She was also found to have difficulty speaking concerning for stroke. She was noted to have mild right-sided weakness at that time. She was also aphasic and able to follow commands but was not able to speak.  She was seen on teleneurology consultation by DrMarland Kitchen Hortense Ramal. Her symptoms improved shortly after admission and she was not considered for thrombolysis.  CT head was unremarkable.  CT angiogram showed no LVO with moderate right supraclinoid ICA and mild left ProcalAmine ICA stenosis.  MRI scan of the brain showed left PCA infarct.  Echocardiogram showed ejection fraction of 60 to 65%.  LA size is normal.  Telemetry monitoring did not show significant arrhythmias.  LDL cholesterol was 68 mg percent.  Hemoglobin A1c was 5.2.  EEG was normal.  Patient was discharged home with right ankle foot cast with nonweightbearing.  She saw orthopedic surgeon Dr. Aline Brochure who plans to do surgery soon and wants neurological and cardiac clearance for the surgery.  Patient is currently wearing a 14-day heart monitor to look for paroxysmal A-fib.  She states she is tolerating aspirin and Plavix well without bruising or bleeding.  She has had no recurrent stroke or  TIA symptoms.  Denies any prior history of strokes or significant neurological problems.  He is tolerating Lipitor well without muscle aches and pains.  Her blood pressure is under good control and today it is 140/80.  He has no new complaints except right ankle foot pain and she is keen to have surgery done soon.  She was seen previously in June 2023 for episode of transient right-sided weakness and heaviness.CT head without contrast 01/20/2022: No acute intracranial abnormality.MRI brain without contrast 01/21/2022: Small acute infarct in the posterior left frontal lobe. Moderate chronic small vessel ischemic disease.MR angio head and neck without contrast 01/21/2022: No evidence of blood vessel occlusion, stenosis.TTE 01/21/2022: LVEF 60 to 65%, no regional wall motion abnormalities, no thrombus, no PFO.  LDL 116.  Hemoglobin A1c 5.2.  She was placed on aspirin and Plavix for 3 weeks followed by aspirin alone. ROS:   14 system review of systems is positive for speech difficulty, fall, weakness, pain all other systems negative  PMH:  Past Medical History:  Diagnosis Date   Abnormal Pap smear    History of cervical cancer    Hyperlipidemia    Hypertension    Inner ear inflammation    Mini stroke 01/2022   Stroke    Vaginal Pap smear, abnormal     Social History:  Social History   Socioeconomic History   Marital status: Married    Spouse name: Not on file   Number of children: 2   Years of education: Not on file   Highest  education level: 12th grade  Occupational History   Not on file  Tobacco Use   Smoking status: Former    Types: Cigarettes    Quit date: 08/03/2006    Years since quitting: 16.2   Smokeless tobacco: Never  Vaping Use   Vaping Use: Never used  Substance and Sexual Activity   Alcohol use: Not Currently    Comment: occassional   Drug use: No   Sexual activity: Yes    Birth control/protection: Surgical    Comment: hyst  Other Topics Concern   Not on file  Social  History Narrative   Not on file   Social Determinants of Health   Financial Resource Strain: Low Risk  (03/03/2022)   Overall Financial Resource Strain (CARDIA)    Difficulty of Paying Living Expenses: Not hard at all  Food Insecurity: No Food Insecurity (10/15/2022)   Hunger Vital Sign    Worried About Running Out of Food in the Last Year: Never true    Ran Out of Food in the Last Year: Never true  Transportation Needs: No Transportation Needs (10/15/2022)   PRAPARE - Hydrologist (Medical): No    Lack of Transportation (Non-Medical): No  Physical Activity: Insufficiently Active (03/03/2022)   Exercise Vital Sign    Days of Exercise per Week: 2 days    Minutes of Exercise per Session: 30 min  Stress: No Stress Concern Present (03/03/2022)   [place]    Feeling of Stress : Not at all  Social Connections: Moderately Integrated (03/03/2022)   Social Connection and Isolation Panel [NHANES]    Frequency of Communication with Friends and Family: More than three times a week    Frequency of Social Gatherings with Friends and Family: Three times a week    Attends Religious Services: More than 4 times per year    Active Member of Clubs or Organizations: No    Attends Archivist Meetings: Never    Marital Status: Married  Human resources officer Violence: Not At Risk (10/15/2022)   Humiliation, Afraid, Rape, and Kick questionnaire    Fear of Current or Ex-Partner: No    Emotionally Abused: No    Physically Abused: No    Sexually Abused: No    Medications:   Current Outpatient Medications on File Prior to Visit  Medication Sig Dispense Refill   aspirin EC 81 MG tablet Take 1 tablet (81 mg total) by mouth daily for 21 days. Swallow whole. 21 tablet 0   atorvastatin (LIPITOR) 40 MG tablet Take 40 mg by mouth daily.     cholecalciferol (VITAMIN D) 1000 UNITS tablet Take 1,000 Units by mouth daily.      clopidogrel (PLAVIX) 75 MG tablet Take 1 tablet (75 mg total) by mouth daily. 30 tablet 2   hydrochlorothiazide (MICROZIDE) 12.5 MG capsule Take 1 capsule (12.5 mg total) by mouth daily. 90 capsule 4   meclizine (ANTIVERT) 25 MG tablet Take 25 mg by mouth daily as needed for dizziness.     Multiple Vitamin (MULTIVITAMIN) tablet Take 1 tablet by mouth daily.     No current facility-administered medications on file prior to visit.    Allergies:  No Known Allergies  Physical Exam General: well developed, well nourished, pleasant middle-aged African-American lady seated, in no evident distress Head: head normocephalic and atraumatic.   Neck: supple with no carotid or supraclavicular bruits Cardiovascular: regular rate and rhythm, no murmurs Musculoskeletal: no  deformity Skin:  no rash/petichiae Vascular:  Normal pulses all extremities  Neurologic Exam Mental Status: Awake and fully alert. Oriented to place and time. Recent and remote memory intact. Attention span, concentration and fund of knowledge appropriate. Mood and affect appropriate.  Cranial Nerves: Fundoscopic exam reveals sharp disc margins. Pupils equal, briskly reactive to light. Extraocular movements full without nystagmus. Visual fields full to confrontation. Hearing intact. Facial sensation intact. Face, tongue, palate moves normally and symmetrically.  Motor: Normal bulk and tone. Normal strength in all tested extremity muscles.  Right lower extremity during testing limited due to ankle fractures and bandage. Sensory.: intact to touch , pinprick , position and vibratory sensation.  Coordination: Rapid alternating movements normal in all extremities. Finger-to-nose and heel-to-shin performed accurately bilaterally. Gait and Station: Arises from chair without difficulty. Stance is normal. Gait demonstrates normal stride length and balance . Able to heel, toe and tandem walk without difficulty.  Reflexes: 1+ and symmetric. Toes  downgoing.   NIHSS  1 Modified Rankin  2   ASSESSMENT: 67 year old African-American lady with left ACA infarct after presenting etiology March 2024.  History of left frontal lacunar infarct from small vessel disease and June 2023.  Vascular risk factors hypertension and hyperlipidemia intracranial atherosclerosis.     PLAN:I had a long d/w patient and her husband about her recent cryptogenic stroke, ankle fracture, risk for recurrent stroke/TIAs, personally independently reviewed imaging studies and stroke evaluation results and answered questions.Continue aspirin 81 mg daily and clopidogrel 75 mg daily for 1 more week and then stop Plavix anticipation for right ankle surgery and hold aspirin for 3 days prior with a small but acceptable periprocedural risk for stroke/TIA if patient is willing.  After the surgery continue Plavix 75 mg daily for secondary stroke prevention and maintain strict control of hypertension with blood pressure goal below 130/90, diabetes with hemoglobin A1c goal below 6.5% and lipids with LDL cholesterol goal below 70 mg/dL. I also advised the patient to eat a healthy diet with plenty of whole grains, cereals, fruits and vegetables, exercise regularly and maintain ideal body weight follow results of 14-day heart monitor for paroxysmal A-fib..  Followup in the future with my nurse practitioner in 4 months or call earlier if necessary.  Greater than 50% time during this 45-minute consultation visit was spent on counseling and coordination of care about the cryptogenic stroke discussion about risk benefits of holding antiplatelet therapy in the perioperative.period for upcoming right ankle fracture surgery.  Antony Contras, MD Note: This document was prepared with digital dictation and possible smart phrase technology. Any transcriptional errors that result from this process are unintentional.

## 2022-11-04 NOTE — Patient Instructions (Signed)
I had a long d/w patient and her husband about her recent cryptogenic stroke, ankle fracture, risk for recurrent stroke/TIAs, personally independently reviewed imaging studies and stroke evaluation results and answered questions.Continue aspirin 81 mg daily and clopidogrel 75 mg daily for 1 more week and then stop Plavix anticipation for right ankle surgery and hold aspirin for 3 days prior with a small respectable periprocedural risk for stroke/TIA if patient is willing.  After the surgery continue Plavix 75 mg daily for secondary stroke prevention and maintain strict control of hypertension with blood pressure goal below 130/90, diabetes with hemoglobin A1c goal below 6.5% and lipids with LDL cholesterol goal below 70 mg/dL. I also advised the patient to eat a healthy diet with plenty of whole grains, cereals, fruits and vegetables, exercise regularly and maintain ideal body weight follow results of 14-day heart monitor for paroxysmal A-fib..  Followup in the future with my nurse practitioner in 4 months or call earlier if necessary.  Stroke Prevention Some medical conditions and behaviors can lead to a higher chance of having a stroke. You can help prevent a stroke by eating healthy, exercising, not smoking, and managing any medical conditions you have. Stroke is a leading cause of functional impairment. Primary prevention is particularly important because a majority of strokes are first-time events. Stroke changes the lives of not only those who experience a stroke but also their family and other caregivers. How can this condition affect me? A stroke is a medical emergency and should be treated right away. A stroke can lead to brain damage and can sometimes be life-threatening. If a person gets medical treatment right away, there is a better chance of surviving and recovering from a stroke. What can increase my risk? The following medical conditions may increase your risk of a stroke: Cardiovascular  disease. High blood pressure (hypertension). Diabetes. High cholesterol. Sickle cell disease. Blood clotting disorders (hypercoagulable state). Obesity. Sleep disorders (obstructive sleep apnea). Other risk factors include: Being older than age 33. Having a history of blood clots, stroke, or mini-stroke (transient ischemic attack, TIA). Genetic factors, such as race, ethnicity, or a family history of stroke. Smoking cigarettes or using other tobacco products. Taking birth control pills, especially if you also use tobacco. Heavy use of alcohol or drugs, especially cocaine and methamphetamine. Physical inactivity. What actions can I take to prevent this? Manage your health conditions High cholesterol levels. Eating a healthy diet is important for preventing high cholesterol. If cholesterol cannot be managed through diet alone, you may need to take medicines. Take any prescribed medicines to control your cholesterol as told by your health care provider. Hypertension. To reduce your risk of stroke, try to keep your blood pressure below 130/80. Eating a healthy diet and exercising regularly are important for controlling blood pressure. If these steps are not enough to manage your blood pressure, you may need to take medicines. Take any prescribed medicines to control hypertension as told by your health care provider. Ask your health care provider if you should monitor your blood pressure at home. Have your blood pressure checked every year, even if your blood pressure is normal. Blood pressure increases with age and some medical conditions. Diabetes. Eating a healthy diet and exercising regularly are important parts of managing your blood sugar (glucose). If your blood sugar cannot be managed through diet and exercise, you may need to take medicines. Take any prescribed medicines to control your diabetes as told by your health care provider. Get evaluated for obstructive sleep  apnea. Talk to  your health care provider about getting a sleep evaluation if you snore a lot or have excessive sleepiness. Make sure that any other medical conditions you have, such as atrial fibrillation or atherosclerosis, are managed. Nutrition Follow instructions from your health care provider about what to eat or drink to help manage your health condition. These instructions may include: Reducing your daily calorie intake. Limiting how much salt (sodium) you use to 1,500 milligrams (mg) each day. Using only healthy fats for cooking, such as olive oil, canola oil, or sunflower oil. Eating healthy foods. You can do this by: Choosing foods that are high in fiber, such as whole grains, and fresh fruits and vegetables. Eating at least 5 servings of fruits and vegetables a day. Try to fill one-half of your plate with fruits and vegetables at each meal. Choosing lean protein foods, such as lean cuts of meat, poultry without skin, fish, tofu, beans, and nuts. Eating low-fat dairy products. Avoiding foods that are high in sodium. This can help lower blood pressure. Avoiding foods that have saturated fat, trans fat, and cholesterol. This can help prevent high cholesterol. Avoiding processed and prepared foods. Counting your daily carbohydrate intake.  Lifestyle If you drink alcohol: Limit how much you have to: 0-1 drink a day for women who are not pregnant. 0-2 drinks a day for men. Know how much alcohol is in your drink. In the [place] one drink equals one 12 oz bottle of beer (395mL), one 5 oz glass of wine (167mL), or one 1 oz glass of hard liquor (66mL). Do not use any products that contain nicotine or tobacco. These products include cigarettes, chewing tobacco, and vaping devices, such as e-cigarettes. If you need help quitting, ask your health care provider. Avoid secondhand smoke. Do not use drugs. Activity  Try to stay at a healthy weight. Get at least 30 minutes of exercise on most days, such  as: Fast walking. Biking. Swimming. Medicines Take over-the-counter and prescription medicines only as told by your health care provider. Aspirin or blood thinners (antiplatelets or anticoagulants) may be recommended to reduce your risk of forming blood clots that can lead to stroke. Avoid taking birth control pills. Talk to your health care provider about the risks of taking birth control pills if: You are over 92 years old. You smoke. You get very bad headaches. You have had a blood clot. Where to find more information American Stroke Association: www.strokeassociation.org Get help right away if: You or a loved one has any symptoms of a stroke. "BE FAST" is an easy way to remember the main warning signs of a stroke: B - Balance. Signs are dizziness, sudden trouble walking, or loss of balance. E - Eyes. Signs are trouble seeing or a sudden change in vision. F - Face. Signs are sudden weakness or numbness of the face, or the face or eyelid drooping on one side. A - Arms. Signs are weakness or numbness in an arm. This happens suddenly and usually on one side of the body. S - Speech. Signs are sudden trouble speaking, slurred speech, or trouble understanding what people say. T - Time. Time to call emergency services. Write down what time symptoms started. You or a loved one has other signs of a stroke, such as: A sudden, severe headache with no known cause. Nausea or vomiting. Seizure. These symptoms may represent a serious problem that is an emergency. Do not wait to see if the symptoms will go away. Get medical  help right away. Call your local emergency services (911 in the [place] Do not drive yourself to the hospital. Summary You can help to prevent a stroke by eating healthy, exercising, not smoking, limiting alcohol intake, and managing any medical conditions you may have. Do not use any products that contain nicotine or tobacco. These include cigarettes, chewing tobacco, and vaping  devices, such as e-cigarettes. If you need help quitting, ask your health care provider. Remember "BE FAST" for warning signs of a stroke. Get help right away if you or a loved one has any of these signs. This information is not intended to replace advice given to you by your health care provider. Make sure you discuss any questions you have with your health care provider. Document Revised: 02/01/2020 Document Reviewed: 02/19/2020 Elsevier Patient Education  [place].

## 2022-11-05 ENCOUNTER — Encounter: Payer: Medicare Other | Admitting: Orthopedic Surgery

## 2022-11-05 ENCOUNTER — Telehealth: Payer: Self-pay | Admitting: Orthopedic Surgery

## 2022-11-05 NOTE — Telephone Encounter (Signed)
Dr Aline Brochure is going to call patient about the Neurology clearance ans schedule for surgery April 17th, but she has appointment with Dr Sharol Given on April 8th, need to look at this

## 2022-11-09 ENCOUNTER — Other Ambulatory Visit (INDEPENDENT_AMBULATORY_CARE_PROVIDER_SITE_OTHER): Payer: Medicare Other

## 2022-11-09 ENCOUNTER — Encounter: Payer: Self-pay | Admitting: Orthopedic Surgery

## 2022-11-09 ENCOUNTER — Ambulatory Visit (INDEPENDENT_AMBULATORY_CARE_PROVIDER_SITE_OTHER): Payer: Medicare Other | Admitting: Orthopedic Surgery

## 2022-11-09 DIAGNOSIS — L97319 Non-pressure chronic ulcer of right ankle with unspecified severity: Secondary | ICD-10-CM

## 2022-11-09 DIAGNOSIS — S82891A Other fracture of right lower leg, initial encounter for closed fracture: Secondary | ICD-10-CM

## 2022-11-09 DIAGNOSIS — I639 Cerebral infarction, unspecified: Secondary | ICD-10-CM | POA: Diagnosis not present

## 2022-11-09 MED ORDER — HYDROCODONE-ACETAMINOPHEN 5-325 MG PO TABS: 1.0000 | ORAL_TABLET | Freq: Four times a day (QID) | ORAL | 0 refills | Status: DC | PRN

## 2022-11-09 NOTE — Progress Notes (Signed)
Office Visit Note   Patient: Jacqueline Leon           Date of Birth: [birth year]           MRN: [id number] Visit Date: 11/09/2022              Requested by: Assunta Found, MD [address] [place],  [place] [address] PCP: Assunta Found, MD  Chief Complaint  Patient presents with   Right Ankle - Fracture      HPI: Right ankle fracture of the fibula.  This underwent closed reduction on March 13.  Patient states she has no residual symptoms from her stroke. Patient is a 67 year old woman who has had a series of strokes.  She is currently on Plavix and aspirin.  Most recently she sustained a Weber B  Assessment & Plan: Visit Diagnoses:  1. Closed fracture of right ankle, initial encounter   2. Ischemic ulcer of right ankle, unspecified ulcer stage     Plan: Discussed with the patient with the large ischemic ulcer over the dorsum of her ankle I do not feel that surgical intervention would be warranted at this time.  Discussed that with the surgical incision she has an increased risk for skin breakdown and further ulcerative changes that may place her at risk of an amputation.  Will follow the fracture conservatively dry dressing over the ankle follow-up weekly.  Cam walker for stability and minimize weightbearing.  Patient states she is currently nonweightbearing with a walker.  She will continue with her Plavix and aspirin.  Follow-Up Instructions: Return in about 1 week (around 11/16/2022).   Ortho Exam  Patient is alert, oriented, no adenopathy, well-dressed, normal affect, normal respiratory effort. Examination patient has a large ischemic ulcer dorsally over the ankle.  It is difficult to tell if this is full-thickness or not.  The ulcer measures 5 x 7 cm.  With the Doppler she has a biphasic anterior tibial pulse and a monophasic dorsalis pedis pulse and a monophasic posterior tibial pulse.  The fibula is not tender to palpation indicating healing of the fibular  fracture.  Imaging: XR Ankle Complete Right  Result Date: 11/09/2022 Three-view radiographs of the right ankle shows shortening of the fibula with widening of the medial joint line.  The mortise is not congruent.  No images are attached to the encounter.  Labs: Lab Results  Component Value Date   HGBA1C 5.2 01/20/2022   HGBA1C 5.5 07/29/2016     Lab Results  Component Value Date   ALBUMIN 3.2 (L) 10/15/2022   ALBUMIN 3.8 10/14/2022   ALBUMIN 3.7 01/21/2022    Lab Results  Component Value Date   MG 2.0 10/15/2022   MG 2.0 10/14/2022   MG 2.0 01/21/2022   Lab Results  Component Value Date   VD25OH 42.1 07/29/2016    No results found for: "PREALBUMIN"    Latest Ref Rng & Units 10/15/2022    4:33 AM 10/14/2022    2:33 PM 10/14/2022    2:30 PM  CBC EXTENDED  WBC 4.0 - 10.5 K/uL 11.6   8.4   RBC 3.87 - 5.11 MIL/uL 4.42   4.72   Hemoglobin 12.0 - 15.0 g/dL 45.8  09.9  83.3   HCT 36.0 - 46.0 % 41.8  45.0  43.8   Platelets 150 - 400 K/uL 227   284   NEUT# 1.7 - 7.7 K/uL 8.3   4.3   Lymph# 0.7 - 4.0 K/uL 2.1  3.1      There is no height or weight on file to calculate BMI.  Orders:  Orders Placed This Encounter  Procedures   XR Ankle Complete Right   Meds ordered this encounter  Medications   HYDROcodone-acetaminophen (NORCO/VICODIN) 5-325 MG tablet    Sig: Take 1 tablet by mouth every 6 (six) hours as needed for moderate pain.    Dispense:  30 tablet    Refill:  0     Procedures: No procedures performed  Clinical Data: No additional findings.  ROS:  All other systems negative, except as noted in the HPI. Review of Systems  Objective: Vital Signs: There were no vitals taken for this visit.  Specialty Comments:  No specialty comments available.  PMFS History: Patient Active Problem List   Diagnosis Date Noted   Closed fracture of right ankle 10/15/2022   S/P hysterectomy 03/03/2022   Mixed hyperlipidemia 01/21/2022   Acute CVA (cerebrovascular  accident) 01/20/2022   Encounter for screening fecal occult blood testing 10/21/2020   Encounter for well woman exam with routine gynecological exam 10/21/2020   Smear, vaginal, as part of routine gynecological examination 08/02/2019   Screening for colorectal cancer 12/17/2017   Elevated cholesterol 12/17/2017   Essential hypertension 12/17/2017   Well woman exam with routine gynecological exam 12/17/2017   S/P ORIF (open reduction internal fixation) fracture 07/12/17 07/22/2017   Closed trimalleolar fracture of ankle with routine healing, left 07/11/2017   History of cervical cancer 06/02/2013   DEGENERATIVE JOINT DISEASE, RIGHT KNEE 09/09/2010   DERANGEMENT OF POSTERIOR HORN OF MEDIAL MENISCUS 09/09/2010   ANKLE PAIN 09/12/2008   Past Medical History:  Diagnosis Date   Abnormal Pap smear    History of cervical cancer    Hyperlipidemia    Hypertension    Inner ear inflammation    Mini stroke 01/2022   Stroke    Vaginal Pap smear, abnormal     Family History  Problem Relation Age of Onset   Hypertension Mother    Other Mother        passed away from childbirth   Hypertension Father    Hypertension Maternal Grandfather    Other Brother        had a pacemaker   Kidney disease Brother    Other Daughter        on dialysis    Past Surgical History:  Procedure Laterality Date   ABDOMINAL HYSTERECTOMY     ORIF ANKLE FRACTURE Left 07/12/2017   Procedure: OPEN REDUCTION INTERNAL FIXATION (ORIF) ANKLE FRACTURE;  Surgeon: Vickki Hearing, MD;  Location: [facility];  Service: Orthopedics;  Laterality: Left;   Social History   Occupational History   Not on file  Tobacco Use   Smoking status: Former    Types: Cigarettes    Quit date: 08/03/2006    Years since quitting: 16.2   Smokeless tobacco: Never  Vaping Use   Vaping Use: Never used  Substance and Sexual Activity   Alcohol use: Not Currently    Comment: occassional   Drug use: No   Sexual activity: Yes    Birth  control/protection: Surgical    Comment: hyst

## 2022-11-10 ENCOUNTER — Telehealth: Payer: Self-pay | Admitting: Internal Medicine

## 2022-11-10 NOTE — Telephone Encounter (Signed)
° °*  STAT* If patient is at the pharmacy, call can be transferred to refill team. ° ° °1. Which medications need to be refilled? (please list name of each medication and dose if known)  ° °clopidogrel (PLAVIX) 75 MG tablet ° ° °2. Which pharmacy/location (including street and city if local pharmacy) is medication to be sent to? ° °[facility] - [place], [place] - [address] ° °3. Do they need a 30 day or 90 day supply? 90 days ° °

## 2022-11-10 NOTE — Telephone Encounter (Signed)
Please see Dr Lajoyce Corners note, he wishes to treat the fracture conservatively and follow the wound she now has.

## 2022-11-11 NOTE — Telephone Encounter (Signed)
May be duplicate request

## 2022-11-11 NOTE — Telephone Encounter (Signed)
Husband called again to have refill for clopidogrel (PLAVIX) 75 MG tablet sent to [facility] - [place], [place] - [address]  Husband states patient is running low on this medication.

## 2022-11-12 ENCOUNTER — Telehealth: Payer: Self-pay | Admitting: Orthopedic Surgery

## 2022-11-12 NOTE — Telephone Encounter (Signed)
Patient was told to call in if anything new or changes with Ankle when changing pads and When changing the Pad today in her boot a little bit of skin came off on the pad but no bleeding and no put the dead skin is located closer to her leg than her foot in the ankle area about the size of a nickel

## 2022-11-12 NOTE — Telephone Encounter (Signed)
Patient advising she missed a call from technicians. Sending call to Triage.

## 2022-11-12 NOTE — Telephone Encounter (Signed)
Pt's husband called to check on status of  clopidogrel (PLAVIX) 75 MG tablet  being sent to pharmacy [facility] - [place], [place] - [address] And would like a call once its been sent. Pt is running low on medication

## 2022-11-12 NOTE — Telephone Encounter (Signed)
This came from [place], she was calling to schedule an appt.

## 2022-11-13 NOTE — Telephone Encounter (Signed)
Call to pharmacy. States that prescription and refills is at pharmacy but not due until 4/14.  She will go ahead and run the script through insurance and for fill so paitient can pick up.  Called to patient husband and advised the RX is there with refills and can be picked up on Sunday 4/14.  He states understanding.

## 2022-11-13 NOTE — Telephone Encounter (Signed)
Patient's husband is following up requesting updates. He states the patient is not almost out of medication.

## 2022-11-15 DIAGNOSIS — S82891A Other fracture of right lower leg, initial encounter for closed fracture: Secondary | ICD-10-CM | POA: Diagnosis not present

## 2022-11-16 ENCOUNTER — Ambulatory Visit: Payer: Medicare Other | Admitting: Orthopedic Surgery

## 2022-11-16 ENCOUNTER — Telehealth: Payer: Self-pay | Admitting: Internal Medicine

## 2022-11-16 ENCOUNTER — Other Ambulatory Visit (INDEPENDENT_AMBULATORY_CARE_PROVIDER_SITE_OTHER): Payer: Medicare Other

## 2022-11-16 DIAGNOSIS — M25571 Pain in right ankle and joints of right foot: Secondary | ICD-10-CM

## 2022-11-16 DIAGNOSIS — L97319 Non-pressure chronic ulcer of right ankle with unspecified severity: Secondary | ICD-10-CM

## 2022-11-16 DIAGNOSIS — S82891A Other fracture of right lower leg, initial encounter for closed fracture: Secondary | ICD-10-CM

## 2022-11-16 NOTE — Telephone Encounter (Signed)
Patient is aware that provider have not finalized results. We will call once provider finalize results.

## 2022-11-16 NOTE — Telephone Encounter (Signed)
Patient's spouse is calling in regards to the patients heart monitor results. Please advise

## 2022-11-18 ENCOUNTER — Telehealth: Payer: Self-pay | Admitting: Internal Medicine

## 2022-11-18 NOTE — Telephone Encounter (Signed)
Spoke with patient and she already has 2 refills on her clopidogrel. She will not need a ne rx until June. She will give [place] a call back in the middle of may for a refill.

## 2022-11-18 NOTE — Telephone Encounter (Signed)
Pt c/o medication issue:  1. Name of Medication: clopidogrel (PLAVIX) 75 MG tablet   2. How are you currently taking this medication (dosage and times per day)?   3. Are you having a reaction (difficulty breathing--STAT)?   4. What is your medication issue? Pt received a text message from [facility], stating she needs approval from Dr. Wyline Mood to get the medication. Pt is wondering if Dr. Wyline Mood can go online and approve. Please advise.

## 2022-11-19 DIAGNOSIS — E785 Hyperlipidemia, unspecified: Secondary | ICD-10-CM | POA: Diagnosis not present

## 2022-11-19 DIAGNOSIS — S92101D Unspecified fracture of right talus, subsequent encounter for fracture with routine healing: Secondary | ICD-10-CM | POA: Diagnosis not present

## 2022-11-19 DIAGNOSIS — I69328 Other speech and language deficits following cerebral infarction: Secondary | ICD-10-CM | POA: Diagnosis not present

## 2022-11-19 DIAGNOSIS — S82841D Displaced bimalleolar fracture of right lower leg, subsequent encounter for closed fracture with routine healing: Secondary | ICD-10-CM | POA: Diagnosis not present

## 2022-11-19 DIAGNOSIS — I10 Essential (primary) hypertension: Secondary | ICD-10-CM | POA: Diagnosis not present

## 2022-11-22 ENCOUNTER — Encounter: Payer: Self-pay | Admitting: Orthopedic Surgery

## 2022-11-22 NOTE — Progress Notes (Signed)
Office Visit Note   Patient: Roy Snuffer Scheib           Date of Birth: 17-Apr-1956           MRN: [id number] Visit Date: 11/16/2022              Requested by: Assunta Found, MD [address] [place],  [place] [address] PCP: Assunta Found, MD  Chief Complaint  Patient presents with   Right Ankle - Follow-up, Fracture      HPI: Patient is a 67 year old woman status post Weber B right ankle fracture with displacement.  Patient developed an ischemic ulcer over the dorsal lateral aspect of her ankle.  With the ischemic ulcer adjacent to where a surgical incision would be for reduction of the fibular fracture open reduction was deemed to be risky.  Assessment & Plan: Visit Diagnoses:  1. Pain in right ankle and joints of right foot   2. Closed fracture of right ankle, initial encounter   3. Ischemic ulcer of right ankle, unspecified ulcer stage     Plan: Continue with closed reduction nonweightbearing in a fracture boot elevation exercise and range of motion.  Three-view radiographs of the right ankle at follow-up.  Follow-Up Instructions: Return in about 4 weeks (around 12/14/2022).   Ortho Exam  Patient is alert, oriented, no adenopathy, well-dressed, normal affect, normal respiratory effort. Examination patient has a valgus alignment to her ankle.  The ischemic ulcer over the dorsal lateral aspect of the foot and ankle measures 8 x 5 cm.  She is on Plavix and aspirin for a history of a stroke.  Doppler shows irregular rate.  Patient states she does not have A-fib.  The pulse was monophasic dorsalis pedis and posterior tibial.  Imaging: No results found. No images are attached to the encounter.  Labs: Lab Results  Component Value Date   HGBA1C 5.2 01/20/2022   HGBA1C 5.5 07/29/2016     Lab Results  Component Value Date   ALBUMIN 3.2 (L) 10/15/2022   ALBUMIN 3.8 10/14/2022   ALBUMIN 3.7 01/21/2022    Lab Results  Component Value Date   MG 2.0 10/15/2022   MG  2.0 10/14/2022   MG 2.0 01/21/2022   Lab Results  Component Value Date   VD25OH 42.1 07/29/2016    No results found for: "PREALBUMIN"    Latest Ref Rng & Units 10/15/2022    4:33 AM 10/14/2022    2:33 PM 10/14/2022    2:30 PM  CBC EXTENDED  WBC 4.0 - 10.5 K/uL 11.6   8.4   RBC 3.87 - 5.11 MIL/uL 4.42   4.72   Hemoglobin 12.0 - 15.0 g/dL 19.1  47.8  29.5   HCT 36.0 - 46.0 % 41.8  45.0  43.8   Platelets 150 - 400 K/uL 227   284   NEUT# 1.7 - 7.7 K/uL 8.3   4.3   Lymph# 0.7 - 4.0 K/uL 2.1   3.1      There is no height or weight on file to calculate BMI.  Orders:  Orders Placed This Encounter  Procedures   XR Ankle Complete Right   No orders of the defined types were placed in this encounter.    Procedures: No procedures performed  Clinical Data: No additional findings.  ROS:  All other systems negative, except as noted in the HPI. Review of Systems  Objective: Vital Signs: There were no vitals taken for this visit.  Specialty Comments:  No specialty  comments available.  PMFS History: Patient Active Problem List   Diagnosis Date Noted   Closed fracture of right ankle 10/15/2022   S/P hysterectomy 03/03/2022   Mixed hyperlipidemia 01/21/2022   Acute CVA (cerebrovascular accident) 01/20/2022   Encounter for screening fecal occult blood testing 10/21/2020   Encounter for well woman exam with routine gynecological exam 10/21/2020   Smear, vaginal, as part of routine gynecological examination 08/02/2019   Screening for colorectal cancer 12/17/2017   Elevated cholesterol 12/17/2017   Essential hypertension 12/17/2017   Well woman exam with routine gynecological exam 12/17/2017   S/P ORIF (open reduction internal fixation) fracture 07/12/17 07/22/2017   Closed trimalleolar fracture of ankle with routine healing, left 07/11/2017   History of cervical cancer 06/02/2013   DEGENERATIVE JOINT DISEASE, RIGHT KNEE 09/09/2010   DERANGEMENT OF POSTERIOR HORN OF MEDIAL  MENISCUS 09/09/2010   ANKLE PAIN 09/12/2008   Past Medical History:  Diagnosis Date   Abnormal Pap smear    History of cervical cancer    Hyperlipidemia    Hypertension    Inner ear inflammation    Mini stroke 01/2022   Stroke    Vaginal Pap smear, abnormal     Family History  Problem Relation Age of Onset   Hypertension Mother    Other Mother        passed away from childbirth   Hypertension Father    Hypertension Maternal Grandfather    Other Brother        had a pacemaker   Kidney disease Brother    Other Daughter        on dialysis    Past Surgical History:  Procedure Laterality Date   ABDOMINAL HYSTERECTOMY     ORIF ANKLE FRACTURE Left 07/12/2017   Procedure: OPEN REDUCTION INTERNAL FIXATION (ORIF) ANKLE FRACTURE;  Surgeon: Vickki Hearing, MD;  Location: [facility];  Service: Orthopedics;  Laterality: Left;   Social History   Occupational History   Not on file  Tobacco Use   Smoking status: Former    Types: Cigarettes    Quit date: 08/03/2006    Years since quitting: 16.3   Smokeless tobacco: Never  Vaping Use   Vaping Use: Never used  Substance and Sexual Activity   Alcohol use: Not Currently    Comment: occassional   Drug use: No   Sexual activity: Yes    Birth control/protection: Surgical    Comment: hyst

## 2022-11-24 ENCOUNTER — Ambulatory Visit: Payer: Medicare Other | Admitting: Orthopedic Surgery

## 2022-11-24 DIAGNOSIS — S82891A Other fracture of right lower leg, initial encounter for closed fracture: Secondary | ICD-10-CM

## 2022-11-29 ENCOUNTER — Encounter: Payer: Self-pay | Admitting: Orthopedic Surgery

## 2022-11-29 NOTE — Progress Notes (Signed)
Office Visit Note   Patient: Jacqueline Leon           Date of Birth: 22-Apr-1956           MRN: [id number] Visit Date: 11/24/2022              Requested by: Assunta Found, MD [address] [place],  [place] [address] PCP: Assunta Found, MD  Chief Complaint  Patient presents with   Right Ankle - Follow-up, Fracture    Weber B ankle fracture with displacement       HPI: Patient is a 67 year old woman who presents status post Weber B fibular fracture right ankle with increased joint space medially and valgus alignment.  Patient has had an ischemic ulcer anterior laterally and was unable to undergo surgical intervention.  Assessment & Plan: Visit Diagnoses:  1. Closed fracture of right ankle, initial encounter     Plan: Will use an Ace wrap to help decrease the swelling continue with the fracture boot nonweightbearing follow-up in 3 weeks with repeat three-view radiographs of the right ankle.  Follow-Up Instructions: Return in about 3 weeks (around 12/15/2022).   Ortho Exam  Patient is alert, oriented, no adenopathy, well-dressed, normal affect, normal respiratory effort. Examination the center of the ischemic ulcer dorsal lateral ankle is black 50% of the edges have improved with epithelialization.  Concern for full-thickness ulceration in the mid aspect of the ulcer.  Imaging: No results found. No images are attached to the encounter.  Labs: Lab Results  Component Value Date   HGBA1C 5.2 01/20/2022   HGBA1C 5.5 07/29/2016     Lab Results  Component Value Date   ALBUMIN 3.2 (L) 10/15/2022   ALBUMIN 3.8 10/14/2022   ALBUMIN 3.7 01/21/2022    Lab Results  Component Value Date   MG 2.0 10/15/2022   MG 2.0 10/14/2022   MG 2.0 01/21/2022   Lab Results  Component Value Date   VD25OH 42.1 07/29/2016    No results found for: "PREALBUMIN"    Latest Ref Rng & Units 10/15/2022    4:33 AM 10/14/2022    2:33 PM 10/14/2022    2:30 PM  CBC EXTENDED  WBC 4.0 -  10.5 K/uL 11.6   8.4   RBC 3.87 - 5.11 MIL/uL 4.42   4.72   Hemoglobin 12.0 - 15.0 g/dL 62.1  30.8  65.7   HCT 36.0 - 46.0 % 41.8  45.0  43.8   Platelets 150 - 400 K/uL 227   284   NEUT# 1.7 - 7.7 K/uL 8.3   4.3   Lymph# 0.7 - 4.0 K/uL 2.1   3.1      There is no height or weight on file to calculate BMI.  Orders:  No orders of the defined types were placed in this encounter.  No orders of the defined types were placed in this encounter.    Procedures: No procedures performed  Clinical Data: No additional findings.  ROS:  All other systems negative, except as noted in the HPI. Review of Systems  Objective: Vital Signs: There were no vitals taken for this visit.  Specialty Comments:  No specialty comments available.  PMFS History: Patient Active Problem List   Diagnosis Date Noted   Closed fracture of right ankle 10/15/2022   S/P hysterectomy 03/03/2022   Mixed hyperlipidemia 01/21/2022   Acute CVA (cerebrovascular accident) (HCC) 01/20/2022   Encounter for screening fecal occult blood testing 10/21/2020   Encounter for well woman exam with  routine gynecological exam 10/21/2020   Smear, vaginal, as part of routine gynecological examination 08/02/2019   Screening for colorectal cancer 12/17/2017   Elevated cholesterol 12/17/2017   Essential hypertension 12/17/2017   Well woman exam with routine gynecological exam 12/17/2017   S/P ORIF (open reduction internal fixation) fracture 07/12/17 07/22/2017   Closed trimalleolar fracture of ankle with routine healing, left 07/11/2017   History of cervical cancer 06/02/2013   DEGENERATIVE JOINT DISEASE, RIGHT KNEE 09/09/2010   DERANGEMENT OF POSTERIOR HORN OF MEDIAL MENISCUS 09/09/2010   ANKLE PAIN 09/12/2008   Past Medical History:  Diagnosis Date   Abnormal Pap smear    History of cervical cancer    Hyperlipidemia    Hypertension    Inner ear inflammation    Mini stroke 01/2022   Stroke (HCC)    Vaginal Pap smear,  abnormal     Family History  Problem Relation Age of Onset   Hypertension Mother    Other Mother        passed away from childbirth   Hypertension Father    Hypertension Maternal Grandfather    Other Brother        had a pacemaker   Kidney disease Brother    Other Daughter        on dialysis    Past Surgical History:  Procedure Laterality Date   ABDOMINAL HYSTERECTOMY     ORIF ANKLE FRACTURE Left 07/12/2017   Procedure: OPEN REDUCTION INTERNAL FIXATION (ORIF) ANKLE FRACTURE;  Surgeon: Vickki Hearing, MD;  Location: [facility];  Service: Orthopedics;  Laterality: Left;   Social History   Occupational History   Not on file  Tobacco Use   Smoking status: Former    Types: Cigarettes    Quit date: 08/03/2006    Years since quitting: 16.3   Smokeless tobacco: Never  Vaping Use   Vaping Use: Never used  Substance and Sexual Activity   Alcohol use: Not Currently    Comment: occassional   Drug use: No   Sexual activity: Yes    Birth control/protection: Surgical    Comment: hyst

## 2022-11-30 DIAGNOSIS — I10 Essential (primary) hypertension: Secondary | ICD-10-CM | POA: Diagnosis not present

## 2022-12-03 ENCOUNTER — Telehealth: Payer: Self-pay | Admitting: Internal Medicine

## 2022-12-03 DIAGNOSIS — I10 Essential (primary) hypertension: Secondary | ICD-10-CM | POA: Diagnosis not present

## 2022-12-03 DIAGNOSIS — Z79899 Other long term (current) drug therapy: Secondary | ICD-10-CM

## 2022-12-03 DIAGNOSIS — E785 Hyperlipidemia, unspecified: Secondary | ICD-10-CM | POA: Diagnosis not present

## 2022-12-03 DIAGNOSIS — S82841D Displaced bimalleolar fracture of right lower leg, subsequent encounter for closed fracture with routine healing: Secondary | ICD-10-CM | POA: Diagnosis not present

## 2022-12-03 DIAGNOSIS — S92101D Unspecified fracture of right talus, subsequent encounter for fracture with routine healing: Secondary | ICD-10-CM | POA: Diagnosis not present

## 2022-12-03 DIAGNOSIS — I69328 Other speech and language deficits following cerebral infarction: Secondary | ICD-10-CM | POA: Diagnosis not present

## 2022-12-03 NOTE — Telephone Encounter (Signed)
Pt spouse called in concerned about pt monitor results. Informed him RN will call once recommendations are given.

## 2022-12-03 NOTE — Telephone Encounter (Signed)
Forward to Dr.Mallipeddi to result monitor

## 2022-12-03 NOTE — Telephone Encounter (Signed)
Please advise of results for patient

## 2022-12-04 MED ORDER — METOPROLOL SUCCINATE ER 25 MG PO TB24: 25.0000 mg | ORAL_TABLET | Freq: Every day | ORAL | 0 refills | Status: DC

## 2022-12-04 NOTE — Addendum Note (Signed)
Addended by: Judene Companion on: 12/04/2022 08:47 AM   Modules accepted: Orders

## 2022-12-04 NOTE — Telephone Encounter (Signed)
Patient is aware of monitor results and provider recommendations. Metoprolol 25 XL sent to pharmacy.  Patient states while in the hospital they started her on aspirin and plavix and she would like to know do she still need to continue to take them.

## 2022-12-15 ENCOUNTER — Ambulatory Visit (INDEPENDENT_AMBULATORY_CARE_PROVIDER_SITE_OTHER): Payer: Medicare Other | Admitting: Orthopedic Surgery

## 2022-12-15 ENCOUNTER — Other Ambulatory Visit (INDEPENDENT_AMBULATORY_CARE_PROVIDER_SITE_OTHER): Payer: Medicare Other

## 2022-12-15 DIAGNOSIS — S82891A Other fracture of right lower leg, initial encounter for closed fracture: Secondary | ICD-10-CM

## 2022-12-16 DIAGNOSIS — I69328 Other speech and language deficits following cerebral infarction: Secondary | ICD-10-CM | POA: Diagnosis not present

## 2022-12-16 DIAGNOSIS — S92101D Unspecified fracture of right talus, subsequent encounter for fracture with routine healing: Secondary | ICD-10-CM | POA: Diagnosis not present

## 2022-12-16 DIAGNOSIS — S82841D Displaced bimalleolar fracture of right lower leg, subsequent encounter for closed fracture with routine healing: Secondary | ICD-10-CM | POA: Diagnosis not present

## 2022-12-16 DIAGNOSIS — I10 Essential (primary) hypertension: Secondary | ICD-10-CM | POA: Diagnosis not present

## 2022-12-16 DIAGNOSIS — E785 Hyperlipidemia, unspecified: Secondary | ICD-10-CM | POA: Diagnosis not present

## 2022-12-22 ENCOUNTER — Other Ambulatory Visit: Payer: Self-pay

## 2022-12-22 DIAGNOSIS — Z79899 Other long term (current) drug therapy: Secondary | ICD-10-CM

## 2022-12-22 MED ORDER — METOPROLOL SUCCINATE ER 25 MG PO TB24: 25.0000 mg | ORAL_TABLET | Freq: Every day | ORAL | 3 refills | Status: AC

## 2022-12-25 ENCOUNTER — Encounter: Payer: Self-pay | Admitting: Orthopedic Surgery

## 2022-12-25 NOTE — Progress Notes (Signed)
Office Visit Note   Patient: Jacqueline Leon           Date of Birth: [birth year]           MRN: [id number] Visit Date: 12/15/2022              Requested by: Assunta Found, MD [address] [place],  [place] [address] PCP: Assunta Found, MD  Chief Complaint  Patient presents with   Right Ankle - Follow-up    Weber B ankle fracture with displacement       HPI: Patient is a 67 year old woman sustained a displaced fracture of the right ankle she has been immobilized.  She has had large ischemic ulcers dorsally and laterally of the ankle that has prevented internal fixation.  Assessment & Plan: Visit Diagnoses:  1. Closed fracture of right ankle, initial encounter     Plan: Patient will continue with elevation compression and the fracture boot.  Continue nonweightbearing..  Discussed that we may need to proceed with tissue graft surgery if there is dehiscence of the dorsal ankle eschar.  Follow-Up Instructions: No follow-ups on file.   Ortho Exam  Patient is alert, oriented, no adenopathy, well-dressed, normal affect, normal respiratory effort. Examination patient has no pain with active range of motion of the ankle.  She is 2 months out from her injury.  Palpation over the distal fibula and tibia are not tender to palpation.  She has stable ischemic ulcers over the dorsum of the ankle that measures 16 x 5 cm without change.  Imaging: No results found. No images are attached to the encounter.  Labs: Lab Results  Component Value Date   HGBA1C 5.2 01/20/2022   HGBA1C 5.5 07/29/2016     Lab Results  Component Value Date   ALBUMIN 3.2 (L) 10/15/2022   ALBUMIN 3.8 10/14/2022   ALBUMIN 3.7 01/21/2022    Lab Results  Component Value Date   MG 2.0 10/15/2022   MG 2.0 10/14/2022   MG 2.0 01/21/2022   Lab Results  Component Value Date   VD25OH 42.1 07/29/2016    No results found for: "PREALBUMIN"    Latest Ref Rng & Units 10/15/2022    4:33 AM 10/14/2022     2:33 PM 10/14/2022    2:30 PM  CBC EXTENDED  WBC 4.0 - 10.5 K/uL 11.6   8.4   RBC 3.87 - 5.11 MIL/uL 4.42   4.72   Hemoglobin 12.0 - 15.0 g/dL 66.4  40.3  47.4   HCT 36.0 - 46.0 % 41.8  45.0  43.8   Platelets 150 - 400 K/uL 227   284   NEUT# 1.7 - 7.7 K/uL 8.3   4.3   Lymph# 0.7 - 4.0 K/uL 2.1   3.1      There is no height or weight on file to calculate BMI.  Orders:  Orders Placed This Encounter  Procedures   XR Ankle Complete Right   No orders of the defined types were placed in this encounter.    Procedures: No procedures performed  Clinical Data: No additional findings.  ROS:  All other systems negative, except as noted in the HPI. Review of Systems  Objective: Vital Signs: There were no vitals taken for this visit.  Specialty Comments:  No specialty comments available.  PMFS History: Patient Active Problem List   Diagnosis Date Noted   Closed fracture of right ankle 10/15/2022   S/P hysterectomy 03/03/2022   Mixed hyperlipidemia 01/21/2022   Acute  CVA (cerebrovascular accident) (HCC) 01/20/2022   Encounter for screening fecal occult blood testing 10/21/2020   Encounter for well woman exam with routine gynecological exam 10/21/2020   Smear, vaginal, as part of routine gynecological examination 08/02/2019   Screening for colorectal cancer 12/17/2017   Elevated cholesterol 12/17/2017   Essential hypertension 12/17/2017   Well woman exam with routine gynecological exam 12/17/2017   S/P ORIF (open reduction internal fixation) fracture 07/12/17 07/22/2017   Closed trimalleolar fracture of ankle with routine healing, left 07/11/2017   History of cervical cancer 06/02/2013   DEGENERATIVE JOINT DISEASE, RIGHT KNEE 09/09/2010   DERANGEMENT OF POSTERIOR HORN OF MEDIAL MENISCUS 09/09/2010   ANKLE PAIN 09/12/2008   Past Medical History:  Diagnosis Date   Abnormal Pap smear    History of cervical cancer    Hyperlipidemia    Hypertension    Inner ear  inflammation    Mini stroke 01/2022   Stroke (HCC)    Vaginal Pap smear, abnormal     Family History  Problem Relation Age of Onset   Hypertension Mother    Other Mother        passed away from childbirth   Hypertension Father    Hypertension Maternal Grandfather    Other Brother        had a pacemaker   Kidney disease Brother    Other Daughter        on dialysis    Past Surgical History:  Procedure Laterality Date   ABDOMINAL HYSTERECTOMY     ORIF ANKLE FRACTURE Left 07/12/2017   Procedure: OPEN REDUCTION INTERNAL FIXATION (ORIF) ANKLE FRACTURE;  Surgeon: Vickki Hearing, MD;  Location: [facility];  Service: Orthopedics;  Laterality: Left;   Social History   Occupational History   Not on file  Tobacco Use   Smoking status: Former    Types: Cigarettes    Quit date: 08/03/2006    Years since quitting: 16.4   Smokeless tobacco: Never  Vaping Use   Vaping Use: Never used  Substance and Sexual Activity   Alcohol use: Not Currently    Comment: occassional   Drug use: No   Sexual activity: Yes    Birth control/protection: Surgical    Comment: hyst

## 2022-12-31 ENCOUNTER — Encounter: Payer: Self-pay | Admitting: Internal Medicine

## 2022-12-31 ENCOUNTER — Telehealth: Payer: Self-pay | Admitting: Internal Medicine

## 2022-12-31 MED ORDER — CLOPIDOGREL BISULFATE 75 MG PO TABS: 75.0000 mg | ORAL_TABLET | Freq: Every day | ORAL | 3 refills | Status: DC

## 2022-12-31 NOTE — Telephone Encounter (Signed)
Error

## 2022-12-31 NOTE — Telephone Encounter (Signed)
*  STAT* If patient is at the pharmacy, call can be transferred to refill team.   1. Which medications need to be refilled? (please list name of each medication and dose if known) clopidogrel (PLAVIX) 75 MG tablet  2. Which pharmacy/location (including street and city if local pharmacy) is medication to be sent to? [facility] - [place], [place] - [address] [place]  3. Do they need a 30 day or 90 day supply? 90 day supply

## 2023-01-11 ENCOUNTER — Encounter: Payer: Self-pay | Admitting: Neurology

## 2023-01-11 ENCOUNTER — Encounter: Payer: Self-pay | Admitting: Internal Medicine

## 2023-01-12 NOTE — Telephone Encounter (Signed)
Dr Lajoyce Corners advised her to stay on both plavix and aspirin due to a foot injury. Do you agree she needs to be on both? Please advise

## 2023-01-13 ENCOUNTER — Encounter: Payer: Self-pay | Admitting: Orthopedic Surgery

## 2023-01-18 ENCOUNTER — Encounter: Payer: Self-pay | Admitting: Orthopedic Surgery

## 2023-01-18 ENCOUNTER — Ambulatory Visit: Payer: Medicare Other | Admitting: Orthopedic Surgery

## 2023-01-18 DIAGNOSIS — L97319 Non-pressure chronic ulcer of right ankle with unspecified severity: Secondary | ICD-10-CM | POA: Diagnosis not present

## 2023-01-18 DIAGNOSIS — S82891A Other fracture of right lower leg, initial encounter for closed fracture: Secondary | ICD-10-CM

## 2023-01-18 NOTE — Progress Notes (Signed)
Office Visit Note   Patient: Jacqueline Leon           Date of Birth: 10/10/1955           MRN: [id number] Visit Date: 01/18/2023              Requested by: Assunta Found, MD [address] [place],  [place] [address] PCP: Assunta Found, MD  Chief Complaint  Patient presents with   Right Ankle - Follow-up    Weber B ankle fracture with displacement      HPI: Patient is a 67 year old woman who is 3 months status post closed right ankle Weber B fracture with tibial talar displacement and a large ischemic ulcer over the anterior of the ankle.  Assessment & Plan: Visit Diagnoses:  1. Closed fracture of right ankle, initial encounter   2. Ischemic ulcer of right ankle, unspecified ulcer stage (HCC)     Plan: Patient will continue with dry dressing and cleansing.  Patient will still need some support but does not need the full fracture boot.  Will place in a short fracture boot at this time.  At follow-up repeat three-view radiographs of the right ankle.  Follow-Up Instructions: Return in about 4 weeks (around 02/15/2023).   Ortho Exam  Patient is alert, oriented, no adenopathy, well-dressed, normal affect, normal respiratory effort. Examination patient's foot is plantigrade the eschar over the dorsum of the ankle is smaller.  Wound prescription form scab measures 3 x 4 cm.  There is no cellulitis no drainage there is good epithelialization around the wound edges.  Imaging: No results found. No images are attached to the encounter.  Labs: Lab Results  Component Value Date   HGBA1C 5.2 01/20/2022   HGBA1C 5.5 07/29/2016     Lab Results  Component Value Date   ALBUMIN 3.2 (L) 10/15/2022   ALBUMIN 3.8 10/14/2022   ALBUMIN 3.7 01/21/2022    Lab Results  Component Value Date   MG 2.0 10/15/2022   MG 2.0 10/14/2022   MG 2.0 01/21/2022   Lab Results  Component Value Date   VD25OH 42.1 07/29/2016    No results found for: "PREALBUMIN"    Latest Ref Rng &  Units 10/15/2022    4:33 AM 10/14/2022    2:33 PM 10/14/2022    2:30 PM  CBC EXTENDED  WBC 4.0 - 10.5 K/uL 11.6   8.4   RBC 3.87 - 5.11 MIL/uL 4.42   4.72   Hemoglobin 12.0 - 15.0 g/dL 10.2  72.5  36.6   HCT 36.0 - 46.0 % 41.8  45.0  43.8   Platelets 150 - 400 K/uL 227   284   NEUT# 1.7 - 7.7 K/uL 8.3   4.3   Lymph# 0.7 - 4.0 K/uL 2.1   3.1      There is no height or weight on file to calculate BMI.  Orders:  No orders of the defined types were placed in this encounter.  No orders of the defined types were placed in this encounter.    Procedures: No procedures performed  Clinical Data: No additional findings.  ROS:  All other systems negative, except as noted in the HPI. Review of Systems  Objective: Vital Signs: There were no vitals taken for this visit.  Specialty Comments:  No specialty comments available.  PMFS History: Patient Active Problem List   Diagnosis Date Noted   Closed fracture of right ankle 10/15/2022   S/P hysterectomy 03/03/2022   Mixed hyperlipidemia  01/21/2022   Acute CVA (cerebrovascular accident) (HCC) 01/20/2022   Encounter for screening fecal occult blood testing 10/21/2020   Encounter for well woman exam with routine gynecological exam 10/21/2020   Smear, vaginal, as part of routine gynecological examination 08/02/2019   Screening for colorectal cancer 12/17/2017   Elevated cholesterol 12/17/2017   Essential hypertension 12/17/2017   Well woman exam with routine gynecological exam 12/17/2017   S/P ORIF (open reduction internal fixation) fracture 07/12/17 07/22/2017   Closed trimalleolar fracture of ankle with routine healing, left 07/11/2017   History of cervical cancer 06/02/2013   DEGENERATIVE JOINT DISEASE, RIGHT KNEE 09/09/2010   DERANGEMENT OF POSTERIOR HORN OF MEDIAL MENISCUS 09/09/2010   ANKLE PAIN 09/12/2008   Past Medical History:  Diagnosis Date   Abnormal Pap smear    History of cervical cancer    Hyperlipidemia     Hypertension    Inner ear inflammation    Mini stroke 01/2022   Stroke (HCC)    Vaginal Pap smear, abnormal     Family History  Problem Relation Age of Onset   Hypertension Mother    Other Mother        passed away from childbirth   Hypertension Father    Hypertension Maternal Grandfather    Other Brother        had a pacemaker   Kidney disease Brother    Other Daughter        on dialysis    Past Surgical History:  Procedure Laterality Date   ABDOMINAL HYSTERECTOMY     ORIF ANKLE FRACTURE Left 07/12/2017   Procedure: OPEN REDUCTION INTERNAL FIXATION (ORIF) ANKLE FRACTURE;  Surgeon: Vickki Hearing, MD;  Location: [facility];  Service: Orthopedics;  Laterality: Left;   Social History   Occupational History   Not on file  Tobacco Use   Smoking status: Former    Types: Cigarettes    Quit date: 08/03/2006    Years since quitting: 16.4   Smokeless tobacco: Never  Vaping Use   Vaping Use: Never used  Substance and Sexual Activity   Alcohol use: Not Currently    Comment: occassional   Drug use: No   Sexual activity: Yes    Birth control/protection: Surgical    Comment: hyst

## 2023-02-15 ENCOUNTER — Encounter: Payer: Self-pay | Admitting: Orthopedic Surgery

## 2023-02-15 ENCOUNTER — Ambulatory Visit: Payer: Medicare Other | Admitting: Orthopedic Surgery

## 2023-02-15 ENCOUNTER — Other Ambulatory Visit: Payer: Self-pay

## 2023-02-15 DIAGNOSIS — S82891A Other fracture of right lower leg, initial encounter for closed fracture: Secondary | ICD-10-CM | POA: Diagnosis not present

## 2023-02-15 NOTE — Progress Notes (Signed)
Office Visit Note   Patient: Jacqueline Leon           Date of Birth: 05-03-1956           MRN: [id number] Visit Date: 02/15/2023              Requested by: Assunta Found, MD [address] [place],  [place] [address] PCP: Assunta Found, MD  Chief Complaint  Patient presents with   Right Ankle - Fracture    Weber B ankle fracture with displacement      HPI: Patient is a 67 year old woman is seen in follow-up for Weber B ankle fracture on the right.  Patient had a large ischemic soft tissue envelope dorsally that prevented internal fixation.  Patient is currently full weightbearing in a boot.  Assessment & Plan: Visit Diagnoses:  1. Closed fracture of right ankle, initial encounter     Plan: Patient is showing slow steady improvement of the eschar.  Will continue with routine wound care weightbearing as tolerated follow-up in 2 weeks.  Follow-Up Instructions: Return in about 2 weeks (around 03/01/2023).   Ortho Exam  Patient is alert, oriented, no adenopathy, well-dressed, normal affect, normal respiratory effort. Examination the eschar is resolving.  A 10 blade knife was used to remove the majority of the eschar.  There is good epithelialization and only 1 cm remaining of eschar.  There is no cellulitis odor or drainage.  Imaging: XR Ankle Complete Right  Result Date: 02/15/2023 Three-view radiographs of the right ankle shows interval healing with valgus tibial talar joint space and widening of the medial clear space.   No images are attached to the encounter.  Labs: Lab Results  Component Value Date   HGBA1C 5.2 01/20/2022   HGBA1C 5.5 07/29/2016     Lab Results  Component Value Date   ALBUMIN 3.2 (L) 10/15/2022   ALBUMIN 3.8 10/14/2022   ALBUMIN 3.7 01/21/2022    Lab Results  Component Value Date   MG 2.0 10/15/2022   MG 2.0 10/14/2022   MG 2.0 01/21/2022   Lab Results  Component Value Date   VD25OH 42.1 07/29/2016    No results found for:  "PREALBUMIN"    Latest Ref Rng & Units 10/15/2022    4:33 AM 10/14/2022    2:33 PM 10/14/2022    2:30 PM  CBC EXTENDED  WBC 4.0 - 10.5 K/uL 11.6   8.4   RBC 3.87 - 5.11 MIL/uL 4.42   4.72   Hemoglobin 12.0 - 15.0 g/dL 64.4  03.4  74.2   HCT 36.0 - 46.0 % 41.8  45.0  43.8   Platelets 150 - 400 K/uL 227   284   NEUT# 1.7 - 7.7 K/uL 8.3   4.3   Lymph# 0.7 - 4.0 K/uL 2.1   3.1      There is no height or weight on file to calculate BMI.  Orders:  Orders Placed This Encounter  Procedures   XR Ankle Complete Right   No orders of the defined types were placed in this encounter.    Procedures: No procedures performed  Clinical Data: No additional findings.  ROS:  All other systems negative, except as noted in the HPI. Review of Systems  Objective: Vital Signs: There were no vitals taken for this visit.  Specialty Comments:  No specialty comments available.  PMFS History: Patient Active Problem List   Diagnosis Date Noted   Closed fracture of right ankle 10/15/2022   S/P hysterectomy  03/03/2022   Mixed hyperlipidemia 01/21/2022   Acute CVA (cerebrovascular accident) (HCC) 01/20/2022   Encounter for screening fecal occult blood testing 10/21/2020   Encounter for well woman exam with routine gynecological exam 10/21/2020   Smear, vaginal, as part of routine gynecological examination 08/02/2019   Screening for colorectal cancer 12/17/2017   Elevated cholesterol 12/17/2017   Essential hypertension 12/17/2017   Well woman exam with routine gynecological exam 12/17/2017   S/P ORIF (open reduction internal fixation) fracture 07/12/17 07/22/2017   Closed trimalleolar fracture of ankle with routine healing, left 07/11/2017   History of cervical cancer 06/02/2013   DEGENERATIVE JOINT DISEASE, RIGHT KNEE 09/09/2010   DERANGEMENT OF POSTERIOR HORN OF MEDIAL MENISCUS 09/09/2010   ANKLE PAIN 09/12/2008   Past Medical History:  Diagnosis Date   Abnormal Pap smear    History of  cervical cancer    Hyperlipidemia    Hypertension    Inner ear inflammation    Mini stroke 01/2022   Stroke (HCC)    Vaginal Pap smear, abnormal     Family History  Problem Relation Age of Onset   Hypertension Mother    Other Mother        passed away from childbirth   Hypertension Father    Hypertension Maternal Grandfather    Other Brother        had a pacemaker   Kidney disease Brother    Other Daughter        on dialysis    Past Surgical History:  Procedure Laterality Date   ABDOMINAL HYSTERECTOMY     ORIF ANKLE FRACTURE Left 07/12/2017   Procedure: OPEN REDUCTION INTERNAL FIXATION (ORIF) ANKLE FRACTURE;  Surgeon: Vickki Hearing, MD;  Location: [facility];  Service: Orthopedics;  Laterality: Left;   Social History   Occupational History   Not on file  Tobacco Use   Smoking status: Former    Current packs/day: 0.00    Types: Cigarettes    Quit date: 08/03/2006    Years since quitting: 16.5   Smokeless tobacco: Never  Vaping Use   Vaping status: Never Used  Substance and Sexual Activity   Alcohol use: Not Currently    Comment: occassional   Drug use: No   Sexual activity: Yes    Birth control/protection: Surgical    Comment: hyst

## 2023-03-01 ENCOUNTER — Ambulatory Visit: Payer: Medicare Other | Admitting: Orthopedic Surgery

## 2023-03-01 DIAGNOSIS — L97319 Non-pressure chronic ulcer of right ankle with unspecified severity: Secondary | ICD-10-CM

## 2023-03-01 DIAGNOSIS — S82891A Other fracture of right lower leg, initial encounter for closed fracture: Secondary | ICD-10-CM

## 2023-03-08 ENCOUNTER — Other Ambulatory Visit: Payer: Self-pay | Admitting: Orthopedic Surgery

## 2023-03-09 ENCOUNTER — Other Ambulatory Visit: Payer: Self-pay | Admitting: Orthopedic Surgery

## 2023-03-09 MED ORDER — HYDROCODONE-ACETAMINOPHEN 5-325 MG PO TABS: 1.0000 | ORAL_TABLET | Freq: Four times a day (QID) | ORAL | 0 refills | Status: DC | PRN

## 2023-03-16 ENCOUNTER — Encounter: Payer: Self-pay | Admitting: Orthopedic Surgery

## 2023-03-16 NOTE — Progress Notes (Signed)
Office Visit Note   Patient: Jacqueline Leon           Date of Birth: [birth year]           MRN: [id number] Visit Date: 03/01/2023              Requested by: Assunta Found, MD [address] [place],  [place] [address] PCP: Assunta Found, MD  Chief Complaint  Patient presents with   Right Ankle - Fracture, Follow-up    Weber B ankle fracture with displacement      HPI: Patient is a 67 year old woman who presents in follow-up for displaced Weber B fibular fracture with ischemic ulcers over the ankle.  Patient is currently in a fracture boot.  Assessment & Plan: Visit Diagnoses:  1. Closed fracture of right ankle, initial encounter   2. Ischemic ulcer of right ankle, unspecified ulcer stage (HCC)     Plan: Patient will advance to weightbearing as tolerated in a new balance sneaker.  Follow-Up Instructions: Return in about 4 weeks (around 03/29/2023).   Ortho Exam  Patient is alert, oriented, no adenopathy, well-dressed, normal affect, normal respiratory effort. Morning examination the ankle has healed and valgus.  She has no pain with ambulation.  The anterior ankle wound is healing there is currently a ischemic area 1 cm in diameter with no exposed tendons.  Her toes move well with decreased range of motion.  There is no cellulitis no signs of infection.  Imaging: No results found. No images are attached to the encounter.  Labs: Lab Results  Component Value Date   HGBA1C 5.2 01/20/2022   HGBA1C 5.5 07/29/2016     Lab Results  Component Value Date   ALBUMIN 3.2 (L) 10/15/2022   ALBUMIN 3.8 10/14/2022   ALBUMIN 3.7 01/21/2022    Lab Results  Component Value Date   MG 2.0 10/15/2022   MG 2.0 10/14/2022   MG 2.0 01/21/2022   Lab Results  Component Value Date   VD25OH 42.1 07/29/2016    No results found for: "PREALBUMIN"    Latest Ref Rng & Units 10/15/2022    4:33 AM 10/14/2022    2:33 PM 10/14/2022    2:30 PM  CBC EXTENDED  WBC 4.0 - 10.5 K/uL 11.6    8.4   RBC 3.87 - 5.11 MIL/uL 4.42   4.72   Hemoglobin 12.0 - 15.0 g/dL 96.2  95.2  84.1   HCT 36.0 - 46.0 % 41.8  45.0  43.8   Platelets 150 - 400 K/uL 227   284   NEUT# 1.7 - 7.7 K/uL 8.3   4.3   Lymph# 0.7 - 4.0 K/uL 2.1   3.1      There is no height or weight on file to calculate BMI.  Orders:  No orders of the defined types were placed in this encounter.  No orders of the defined types were placed in this encounter.    Procedures: No procedures performed  Clinical Data: No additional findings.  ROS:  All other systems negative, except as noted in the HPI. Review of Systems  Objective: Vital Signs: There were no vitals taken for this visit.  Specialty Comments:  No specialty comments available.  PMFS History: Patient Active Problem List   Diagnosis Date Noted   Closed fracture of right ankle 10/15/2022   S/P hysterectomy 03/03/2022   Mixed hyperlipidemia 01/21/2022   Acute CVA (cerebrovascular accident) (HCC) 01/20/2022   Encounter for screening fecal occult blood testing 10/21/2020  Encounter for well woman exam with routine gynecological exam 10/21/2020   Smear, vaginal, as part of routine gynecological examination 08/02/2019   Screening for colorectal cancer 12/17/2017   Elevated cholesterol 12/17/2017   Essential hypertension 12/17/2017   Well woman exam with routine gynecological exam 12/17/2017   S/P ORIF (open reduction internal fixation) fracture 07/12/17 07/22/2017   Closed trimalleolar fracture of ankle with routine healing, left 07/11/2017   History of cervical cancer 06/02/2013   DEGENERATIVE JOINT DISEASE, RIGHT KNEE 09/09/2010   DERANGEMENT OF POSTERIOR HORN OF MEDIAL MENISCUS 09/09/2010   ANKLE PAIN 09/12/2008   Past Medical History:  Diagnosis Date   Abnormal Pap smear    History of cervical cancer    Hyperlipidemia    Hypertension    Inner ear inflammation    Mini stroke 01/2022   Stroke (HCC)    Vaginal Pap smear, abnormal      Family History  Problem Relation Age of Onset   Hypertension Mother    Other Mother        passed away from childbirth   Hypertension Father    Hypertension Maternal Grandfather    Other Brother        had a pacemaker   Kidney disease Brother    Other Daughter        on dialysis    Past Surgical History:  Procedure Laterality Date   ABDOMINAL HYSTERECTOMY     ORIF ANKLE FRACTURE Left 07/12/2017   Procedure: OPEN REDUCTION INTERNAL FIXATION (ORIF) ANKLE FRACTURE;  Surgeon: Vickki Hearing, MD;  Location: [facility];  Service: Orthopedics;  Laterality: Left;   Social History   Occupational History   Not on file  Tobacco Use   Smoking status: Former    Current packs/day: 0.00    Types: Cigarettes    Quit date: 08/03/2006    Years since quitting: 16.6   Smokeless tobacco: Never  Vaping Use   Vaping status: Never Used  Substance and Sexual Activity   Alcohol use: Not Currently    Comment: occassional   Drug use: No   Sexual activity: Yes    Birth control/protection: Surgical    Comment: hyst

## 2023-03-30 ENCOUNTER — Ambulatory Visit: Payer: Medicare Other | Admitting: Adult Health

## 2023-03-30 ENCOUNTER — Encounter: Payer: Self-pay | Admitting: Adult Health

## 2023-03-30 VITALS — BP 132/78 | HR 78 | Ht 64.0 in | Wt 181.0 lb

## 2023-03-30 DIAGNOSIS — I639 Cerebral infarction, unspecified: Secondary | ICD-10-CM

## 2023-03-30 DIAGNOSIS — Z8673 Personal history of transient ischemic attack (TIA), and cerebral infarction without residual deficits: Secondary | ICD-10-CM | POA: Diagnosis not present

## 2023-03-30 NOTE — Progress Notes (Signed)
[facility] [address] [place]. [place] [address] [phone]       OFFICE FOLLOW UP NOTE  Jacqueline Leon Date of Birth:  1955/08/20 Medical Record Number:  [id number]    Primary neurologist: Dr. Pearlean Brownie Reason for Referral: Stroke  Chief Complaint  Patient presents with   Follow-up    Patient in room #8  with her husband. Patient states she is well and stable, with no new concerns.     HPI:   Update 03/30/2023 JM: Patient returns for follow-up visit accompanied by her husband.  Stable from stroke standpoint without new stroke/TIA symptoms and denies any residual deficits.  Continues on Plavix and atorvastatin 40 mg daily.  Routinely follows with PCP for stroke risk factor management, has wellness visit scheduled in November.  30 cardiac event monitor negative for A-fib.  Continues to follow with orthopedics for right ankle fracture, did not end up proceeding with surgery due to large ischemic ulcer over anterior ankle, reports healing well, now out of boot over the past month, still ambulates with a cane. Has f/u visit next week.     History provided for reference purposes only Consult visit 11/04/2022 Dr. Pearlean Brownie: Jacqueline Leon is a pleasant 67 year old African-American lady seen today for initial office consultation visit for stroke.  She is accompanied by her husband.  History is obtained from them and review of electronic medical records.  I have also personally reviewed pertinent available imaging films in PACS.Jacqueline Leon is a 67 y.o. female with a history of CVA, dizziness, HTN, HLD who presented to the [facility] ED on 10/14/2022 after an unwitnessed fall at home with right ankle fracture which was reduced in the ED. She was also found to have difficulty speaking concerning for stroke. She was noted to have mild right-sided weakness at that time. She was also aphasic and able to follow commands but was not able to speak.  She was seen on teleneurology consultation by  DrMarland Kitchen Melynda Ripple. Her symptoms improved shortly after admission and she was not considered for thrombolysis.  CT head was unremarkable.  CT angiogram showed no LVO with moderate right supraclinoid ICA and mild left ProcalAmine ICA stenosis.  MRI scan of the brain showed left PCA infarct.  Echocardiogram showed ejection fraction of 60 to 65%.  LA size is normal.  Telemetry monitoring did not show significant arrhythmias.  LDL cholesterol was 68 mg percent.  Hemoglobin A1c was 5.2.  EEG was normal.  Patient was discharged home with right ankle foot cast with nonweightbearing.  She saw orthopedic surgeon Dr. Romeo Apple who plans to do surgery soon and wants neurological and cardiac clearance for the surgery.  Patient is currently wearing a 14-day heart monitor to look for paroxysmal A-fib.  She states she is tolerating aspirin and Plavix well without bruising or bleeding.  She has had no recurrent stroke or TIA symptoms.  Denies any prior history of strokes or significant neurological problems.  He is tolerating Lipitor well without muscle aches and pains.  Her blood pressure is under good control and today it is 140/80.  He has no new complaints except right ankle foot pain and she is keen to have surgery done soon.  She was seen previously in June 2023 for episode of transient right-sided weakness and heaviness.CT head without contrast 01/20/2022: No acute intracranial abnormality.MRI brain without contrast 01/21/2022: Small acute infarct in the posterior left frontal lobe. Moderate chronic small vessel ischemic disease.MR angio head and neck without contrast  01/21/2022: No evidence of blood vessel occlusion, stenosis.TTE 01/21/2022: LVEF 60 to 65%, no regional wall motion abnormalities, no thrombus, no PFO.  LDL 116.  Hemoglobin A1c 5.2.  She was placed on aspirin and Plavix for 3 weeks followed by aspirin alone.    ROS:   14 system review of systems is positive for those listed in HPI and all other systems  negative  PMH:  Past Medical History:  Diagnosis Date   Abnormal Pap smear    History of cervical cancer    Hyperlipidemia    Hypertension    Inner ear inflammation    Mini stroke 01/2022   Stroke [facility])    Vaginal Pap smear, abnormal     Social History:  Social History   Socioeconomic History   Marital status: Married    Spouse name: Not on file   Number of children: 2   Years of education: Not on file   Highest education level: 12th grade  Occupational History   Not on file  Tobacco Use   Smoking status: Former    Current packs/day: 0.00    Types: Cigarettes    Quit date: 08/03/2006    Years since quitting: 16.6   Smokeless tobacco: Never  Vaping Use   Vaping status: Never Used  Substance and Sexual Activity   Alcohol use: Not Currently    Comment: occassional   Drug use: No   Sexual activity: Yes    Birth control/protection: Surgical    Comment: hyst  Other Topics Concern   Not on file  Social History Narrative   Not on file   Social Determinants of Health   Financial Resource Strain: Low Risk  (03/03/2022)   Overall Financial Resource Strain (CARDIA)    Difficulty of Paying Living Expenses: Not hard at all  Food Insecurity: No Food Insecurity (10/15/2022)   Hunger Vital Sign    Worried About Running Out of Food in the Last Year: Never true    Ran Out of Food in the Last Year: Never true  Transportation Needs: No Transportation Needs (10/15/2022)   PRAPARE - Administrator, Civil Service (Medical): No    Lack of Transportation (Non-Medical): No  Physical Activity: Insufficiently Active (03/03/2022)   Exercise Vital Sign    Days of Exercise per Week: 2 days    Minutes of Exercise per Session: 30 min  Stress: No Stress Concern Present (03/03/2022)   [employer] of Occupational Health - Occupational Stress Questionnaire    Feeling of Stress : Not at all  Social Connections: Moderately Integrated (03/03/2022)   Social Connection and Isolation  Panel [NHANES]    Frequency of Communication with Friends and Family: More than three times a week    Frequency of Social Gatherings with Friends and Family: Three times a week    Attends Religious Services: More than 4 times per year    Active Member of Clubs or Organizations: No    Attends Banker Meetings: Never    Marital Status: Married  Catering manager Violence: Not At Risk (10/15/2022)   Humiliation, Afraid, Rape, and Kick questionnaire    Fear of Current or Ex-Partner: No    Emotionally Abused: No    Physically Abused: No    Sexually Abused: No    Medications:   Current Outpatient Medications on File Prior to Visit  Medication Sig Dispense Refill   atorvastatin (LIPITOR) 40 MG tablet Take 40 mg by mouth daily.     cholecalciferol (  VITAMIN D) 1000 UNITS tablet Take 1,000 Units by mouth daily.     clopidogrel (PLAVIX) 75 MG tablet Take 1 tablet (75 mg total) by mouth daily. 90 tablet 3   hydrochlorothiazide (MICROZIDE) 12.5 MG capsule Take 1 capsule (12.5 mg total) by mouth daily. 90 capsule 4   HYDROcodone-acetaminophen (NORCO/VICODIN) 5-325 MG tablet Take 1 tablet by mouth every 6 (six) hours as needed for moderate pain. 30 tablet 0   meclizine (ANTIVERT) 25 MG tablet Take 25 mg by mouth daily as needed for dizziness.     metoprolol succinate (TOPROL XL) 25 MG 24 hr tablet Take 1 tablet (25 mg total) by mouth daily. 90 tablet 3   Multiple Vitamin (MULTIVITAMIN) tablet Take 1 tablet by mouth daily.     No current facility-administered medications on file prior to visit.    Allergies:  No Known Allergies  Physical Exam Today's Vitals   03/30/23 1500  BP: 132/78  Pulse: 78  Weight: 181 lb (82.1 kg)  Height: 5\' 4"  (1.626 m)   Body mass index is 31.07 kg/m.   General: well developed, well nourished, very pleasant middle-aged African-American lady seated, in no evident distress Head: head normocephalic and atraumatic.   Neck: supple with no carotid or  supraclavicular bruits Cardiovascular: regular rate and rhythm, no murmurs Musculoskeletal: no deformity Skin:  no rash/petichiae Vascular:  Normal pulses all extremities  Neurologic Exam Mental Status: Awake and fully alert. Oriented to place and time. Recent and remote memory intact. Attention span, concentration and fund of knowledge appropriate. Mood and affect appropriate.  Cranial Nerves: Fundoscopic exam reveals sharp disc margins. Pupils equal, briskly reactive to light. Extraocular movements full without nystagmus. Visual fields full to confrontation. Hearing intact. Facial sensation intact. Face, tongue, palate moves normally and symmetrically.  Motor: Normal bulk and tone. Normal strength in all tested extremity muscles although limited RLE testing distally Sensory.: intact to touch , pinprick , position and vibratory sensation.  Coordination: Rapid alternating movements normal in all extremities. Finger-to-nose and heel-to-shin performed accurately bilaterally. Gait and Station: Arises from chair without difficulty. Stance is normal. Gait demonstrates wide-based gait with mild favoring of RLE and use of cane.  Tandem walking heel toe not attempted Reflexes: 1+ and symmetric. Toes downgoing.        ASSESSMENT/PLAN: 67 year old African-American lady with left ACA infarct in March 2024.  History of left frontal lacunar infarct from small vessel disease in June 2023.  Vascular risk factors hypertension, hyperlipidemia and intracranial atherosclerosis.   1.  Left ACA stroke 2. Hx of left frontal stroke  -No residual deficits -Cardiac monitor 11/2022 no evidence of A-fib -Continue Plavix and atorvastatin for secondary stroke prevention measures managed by PCP  -Continue close PCP follow-up for aggressive stroke risk factor management including BP goal<130/90, and HLD with LDL goal<70     Doing well from stroke standpoint without further recommendations and risk factors are  managed by PCP. She may follow up PRN, as usual for our patients who are strictly being followed for stroke. If any new neurological issues should arise, request PCP place referral for evaluation by one of our neurologists. Thank you.      I spent 25 minutes of face-to-face and non-face-to-face time with patient and husband.  This included previsit chart review, lab review, study review, order entry, electronic health record documentation, patient education and discussion regarding above diagnoses and treatment plan and answered all other questions to patient and husband's satisfaction  Ihor Austin, AGNP-BC  [facility] [address] [place], [place] [address]  Phone [phone] Fax [phone] Note: This document was prepared with digital dictation and possible smart phrase technology. Any transcriptional errors that result from this process are unintentional.

## 2023-03-30 NOTE — Patient Instructions (Addendum)
Continue clopidogrel 75 mg daily  and atorvastatin for secondary stroke prevention ? ?Continue to follow up with PCP regarding cholesterol and blood pressure management  ?Maintain strict control of hypertension with blood pressure goal below 130/90 and cholesterol with LDL cholesterol (bad cholesterol) goal below 70 mg/dL.  ? ?Signs of a Stroke? Follow the BEFAST method:  ?Balance Watch for a sudden loss of balance, trouble with coordination or vertigo ?Eyes Is there a sudden loss of vision in one or both eyes? Or double vision?  ?Face: Ask the person to smile. Does one side of the face droop or is it numb?  ?Arms: Ask the person to raise both arms. Does one arm drift downward? Is there weakness or numbness of a leg? ?Speech: Ask the person to repeat a simple phrase. Does the speech sound slurred/strange? Is the person confused ? ?Time: If you observe any of these signs, call 911. ? ? ? ? ? ? ?Thank you for coming to see [place] at [facility]. I hope we have been able to provide you high quality care today. ? ?You may receive a patient satisfaction survey over the next few weeks. We would appreciate your feedback and comments so that we may continue to improve ourselves and the health of our patients. ? ?

## 2023-04-06 ENCOUNTER — Encounter: Payer: Self-pay | Admitting: Orthopedic Surgery

## 2023-04-06 ENCOUNTER — Ambulatory Visit: Payer: Medicare Other | Admitting: Orthopedic Surgery

## 2023-04-06 DIAGNOSIS — L97319 Non-pressure chronic ulcer of right ankle with unspecified severity: Secondary | ICD-10-CM | POA: Diagnosis not present

## 2023-04-06 DIAGNOSIS — S82891A Other fracture of right lower leg, initial encounter for closed fracture: Secondary | ICD-10-CM

## 2023-04-06 NOTE — Progress Notes (Signed)
Office Visit Note   Patient: Jacqueline Leon           Date of Birth: 01-14-1956           MRN: [id number] Visit Date: 04/06/2023              Requested by: Assunta Found, MD [address] [place],  [place] [address] PCP: Assunta Found, MD  Chief Complaint  Patient presents with   Right Ankle - Follow-up    Weber B ankle fracture with displacement      HPI: Patient is a 67 year old woman who presents in follow-up status post closed Weber B right ankle fracture.  Patient had a large traumatic wound and this has been treated closed.  Assessment & Plan: Visit Diagnoses:  1. Closed fracture of right ankle, initial encounter   2. Ischemic ulcer of right ankle, unspecified ulcer stage (HCC)     Plan: Continue with dorsiflexion stretching of the ankle.  Increase activities as tolerated.  Follow-Up Instructions: Return in about 4 weeks (around 05/04/2023).   Ortho Exam  Patient is alert, oriented, no adenopathy, well-dressed, normal affect, normal respiratory effort. Examination the eschar continues to resolve.  The eschar is now 2 cm in diameter there is no drainage no cellulitis there is good epithelization around the eschar.  Patient has dorsiflexion to neutral.  Currently ambulating in regular sneakers.  Imaging: No results found. No images are attached to the encounter.  Labs: Lab Results  Component Value Date   HGBA1C 5.2 01/20/2022   HGBA1C 5.5 07/29/2016     Lab Results  Component Value Date   ALBUMIN 3.2 (L) 10/15/2022   ALBUMIN 3.8 10/14/2022   ALBUMIN 3.7 01/21/2022    Lab Results  Component Value Date   MG 2.0 10/15/2022   MG 2.0 10/14/2022   MG 2.0 01/21/2022   Lab Results  Component Value Date   VD25OH 42.1 07/29/2016    No results found for: "PREALBUMIN"    Latest Ref Rng & Units 10/15/2022    4:33 AM 10/14/2022    2:33 PM 10/14/2022    2:30 PM  CBC EXTENDED  WBC 4.0 - 10.5 K/uL 11.6   8.4   RBC 3.87 - 5.11 MIL/uL 4.42   4.72    Hemoglobin 12.0 - 15.0 g/dL 51.8  84.1  66.0   HCT 36.0 - 46.0 % 41.8  45.0  43.8   Platelets 150 - 400 K/uL 227   284   NEUT# 1.7 - 7.7 K/uL 8.3   4.3   Lymph# 0.7 - 4.0 K/uL 2.1   3.1      There is no height or weight on file to calculate BMI.  Orders:  No orders of the defined types were placed in this encounter.  No orders of the defined types were placed in this encounter.    Procedures: No procedures performed  Clinical Data: No additional findings.  ROS:  All other systems negative, except as noted in the HPI. Review of Systems  Objective: Vital Signs: There were no vitals taken for this visit.  Specialty Comments:  No specialty comments available.  PMFS History: Patient Active Problem List   Diagnosis Date Noted   Closed fracture of right ankle 10/15/2022   S/P hysterectomy 03/03/2022   Mixed hyperlipidemia 01/21/2022   Acute CVA (cerebrovascular accident) (HCC) 01/20/2022   Encounter for screening fecal occult blood testing 10/21/2020   Encounter for well woman exam with routine gynecological exam 10/21/2020   Smear,  vaginal, as part of routine gynecological examination 08/02/2019   Screening for colorectal cancer 12/17/2017   Elevated cholesterol 12/17/2017   Essential hypertension 12/17/2017   Well woman exam with routine gynecological exam 12/17/2017   S/P ORIF (open reduction internal fixation) fracture 07/12/17 07/22/2017   Closed trimalleolar fracture of ankle with routine healing, left 07/11/2017   History of cervical cancer 06/02/2013   DEGENERATIVE JOINT DISEASE, RIGHT KNEE 09/09/2010   DERANGEMENT OF POSTERIOR HORN OF MEDIAL MENISCUS 09/09/2010   ANKLE PAIN 09/12/2008   Past Medical History:  Diagnosis Date   Abnormal Pap smear    History of cervical cancer    Hyperlipidemia    Hypertension    Inner ear inflammation    Mini stroke 01/2022   Stroke (HCC)    Vaginal Pap smear, abnormal     Family History  Problem Relation Age of  Onset   Hypertension Mother    Other Mother        passed away from childbirth   Hypertension Father    Hypertension Maternal Grandfather    Other Brother        had a pacemaker   Kidney disease Brother    Other Daughter        on dialysis    Past Surgical History:  Procedure Laterality Date   ABDOMINAL HYSTERECTOMY     ORIF ANKLE FRACTURE Left 07/12/2017   Procedure: OPEN REDUCTION INTERNAL FIXATION (ORIF) ANKLE FRACTURE;  Surgeon: Vickki Hearing, MD;  Location: [facility];  Service: Orthopedics;  Laterality: Left;   Social History   Occupational History   Not on file  Tobacco Use   Smoking status: Former    Current packs/day: 0.00    Types: Cigarettes    Quit date: 08/03/2006    Years since quitting: 16.6   Smokeless tobacco: Never  Vaping Use   Vaping status: Never Used  Substance and Sexual Activity   Alcohol use: Not Currently    Comment: occassional   Drug use: No   Sexual activity: Yes    Birth control/protection: Surgical    Comment: hyst

## 2023-04-27 ENCOUNTER — Other Ambulatory Visit (HOSPITAL_COMMUNITY): Payer: Self-pay | Admitting: Family Medicine

## 2023-04-27 DIAGNOSIS — Z1231 Encounter for screening mammogram for malignant neoplasm of breast: Secondary | ICD-10-CM

## 2023-05-04 ENCOUNTER — Ambulatory Visit: Payer: Medicare Other | Admitting: Orthopedic Surgery

## 2023-05-11 ENCOUNTER — Ambulatory Visit: Payer: Medicare Other | Admitting: Orthopedic Surgery

## 2023-05-11 ENCOUNTER — Encounter: Payer: Self-pay | Admitting: Orthopedic Surgery

## 2023-05-11 DIAGNOSIS — L97319 Non-pressure chronic ulcer of right ankle with unspecified severity: Secondary | ICD-10-CM | POA: Diagnosis not present

## 2023-05-11 DIAGNOSIS — S82891A Other fracture of right lower leg, initial encounter for closed fracture: Secondary | ICD-10-CM | POA: Diagnosis not present

## 2023-05-11 NOTE — Progress Notes (Signed)
Office Visit Note   Patient: Jacqueline Leon           Date of Birth: 22-Oct-1955           MRN: [id number] Visit Date: 05/11/2023              Requested by: Assunta Found, MD [address] [place],  [place] [address] PCP: Assunta Found, MD  Chief Complaint  Patient presents with   Right Ankle - Follow-up      HPI: Patient is a 67 year old woman status post open reduction internal fixation right ankle fracture with large traumatic wound dorsally over the foot and ankle.  Assessment & Plan: Visit Diagnoses: No diagnosis found.  Plan: Patient has shown excellent improvement in the wound healing.  She has dorsiflexion to neutral.  She will work on Achilles stretching and continue with scar massage.  Follow-Up Instructions: Return if symptoms worsen or fail to improve.   Ortho Exam  Patient is alert, oriented, no adenopathy, well-dressed, normal affect, normal respiratory effort. The ankle dorsiflexion is to neutral.  She has essentially healed the dorsal foot and ankle wound with only about 1 cm in diameter scab remaining.  There is no cellulitis or drainage.  Imaging: No results found. No images are attached to the encounter.  Labs: Lab Results  Component Value Date   HGBA1C 5.2 01/20/2022   HGBA1C 5.5 07/29/2016     Lab Results  Component Value Date   ALBUMIN 3.2 (L) 10/15/2022   ALBUMIN 3.8 10/14/2022   ALBUMIN 3.7 01/21/2022    Lab Results  Component Value Date   MG 2.0 10/15/2022   MG 2.0 10/14/2022   MG 2.0 01/21/2022   Lab Results  Component Value Date   VD25OH 42.1 07/29/2016    No results found for: "PREALBUMIN"    Latest Ref Rng & Units 10/15/2022    4:33 AM 10/14/2022    2:33 PM 10/14/2022    2:30 PM  CBC EXTENDED  WBC 4.0 - 10.5 K/uL 11.6   8.4   RBC 3.87 - 5.11 MIL/uL 4.42   4.72   Hemoglobin 12.0 - 15.0 g/dL 78.2  95.6  21.3   HCT 36.0 - 46.0 % 41.8  45.0  43.8   Platelets 150 - 400 K/uL 227   284   NEUT# 1.7 - 7.7 K/uL 8.3   4.3    Lymph# 0.7 - 4.0 K/uL 2.1   3.1      There is no height or weight on file to calculate BMI.  Orders:  No orders of the defined types were placed in this encounter.  No orders of the defined types were placed in this encounter.    Procedures: No procedures performed  Clinical Data: No additional findings.  ROS:  All other systems negative, except as noted in the HPI. Review of Systems  Objective: Vital Signs: There were no vitals taken for this visit.  Specialty Comments:  No specialty comments available.  PMFS History: Patient Active Problem List   Diagnosis Date Noted   Closed fracture of right ankle 10/15/2022   S/P hysterectomy 03/03/2022   Mixed hyperlipidemia 01/21/2022   Acute CVA (cerebrovascular accident) (HCC) 01/20/2022   Encounter for screening fecal occult blood testing 10/21/2020   Encounter for well woman exam with routine gynecological exam 10/21/2020   Smear, vaginal, as part of routine gynecological examination 08/02/2019   Screening for colorectal cancer 12/17/2017   Elevated cholesterol 12/17/2017   Essential hypertension 12/17/2017  Well woman exam with routine gynecological exam 12/17/2017   S/P ORIF (open reduction internal fixation) fracture 07/12/17 07/22/2017   Closed trimalleolar fracture of ankle with routine healing, left 07/11/2017   History of cervical cancer 06/02/2013   DEGENERATIVE JOINT DISEASE, RIGHT KNEE 09/09/2010   DERANGEMENT OF POSTERIOR HORN OF MEDIAL MENISCUS 09/09/2010   ANKLE PAIN 09/12/2008   Past Medical History:  Diagnosis Date   Abnormal Pap smear    History of cervical cancer    Hyperlipidemia    Hypertension    Inner ear inflammation    Mini stroke 01/2022   Stroke (HCC)    Vaginal Pap smear, abnormal     Family History  Problem Relation Age of Onset   Hypertension Mother    Other Mother        passed away from childbirth   Hypertension Father    Hypertension Maternal Grandfather    Other Brother         had a pacemaker   Kidney disease Brother    Other Daughter        on dialysis    Past Surgical History:  Procedure Laterality Date   ABDOMINAL HYSTERECTOMY     ORIF ANKLE FRACTURE Left 07/12/2017   Procedure: OPEN REDUCTION INTERNAL FIXATION (ORIF) ANKLE FRACTURE;  Surgeon: Vickki Hearing, MD;  Location: [facility];  Service: Orthopedics;  Laterality: Left;   Social History   Occupational History   Not on file  Tobacco Use   Smoking status: Former    Current packs/day: 0.00    Types: Cigarettes    Quit date: 08/03/2006    Years since quitting: 16.7   Smokeless tobacco: Never  Vaping Use   Vaping status: Never Used  Substance and Sexual Activity   Alcohol use: Not Currently    Comment: occassional   Drug use: No   Sexual activity: Yes    Birth control/protection: Surgical    Comment: hyst

## 2023-06-10 ENCOUNTER — Other Ambulatory Visit (HOSPITAL_COMMUNITY)
Admission: RE | Admit: 2023-06-10 | Discharge: 2023-06-10 | Disposition: A | Discharge status: Home / Self Care | Payer: Medicare Other | Source: Ambulatory Visit | Attending: Adult Health | Admitting: Adult Health

## 2023-06-10 ENCOUNTER — Ambulatory Visit: Payer: Medicare Other | Admitting: Adult Health

## 2023-06-10 ENCOUNTER — Encounter: Payer: Self-pay | Admitting: Adult Health

## 2023-06-10 ENCOUNTER — Ambulatory Visit (HOSPITAL_COMMUNITY): Payer: Medicare Other

## 2023-06-10 VITALS — BP 138/75 | HR 66 | Ht 64.0 in | Wt 180.5 lb

## 2023-06-10 DIAGNOSIS — Z1331 Encounter for screening for depression: Secondary | ICD-10-CM

## 2023-06-10 DIAGNOSIS — Z08 Encounter for follow-up examination after completed treatment for malignant neoplasm: Secondary | ICD-10-CM | POA: Insufficient documentation

## 2023-06-10 DIAGNOSIS — Z8541 Personal history of malignant neoplasm of cervix uteri: Secondary | ICD-10-CM | POA: Insufficient documentation

## 2023-06-10 DIAGNOSIS — Z01419 Encounter for gynecological examination (general) (routine) without abnormal findings: Secondary | ICD-10-CM | POA: Insufficient documentation

## 2023-06-10 DIAGNOSIS — Z90710 Acquired absence of both cervix and uterus: Secondary | ICD-10-CM

## 2023-06-10 DIAGNOSIS — Z1211 Encounter for screening for malignant neoplasm of colon: Secondary | ICD-10-CM

## 2023-06-10 DIAGNOSIS — Z1151 Encounter for screening for human papillomavirus (HPV): Secondary | ICD-10-CM | POA: Diagnosis not present

## 2023-06-10 DIAGNOSIS — Z0000 Encounter for general adult medical examination without abnormal findings: Secondary | ICD-10-CM | POA: Diagnosis not present

## 2023-06-10 LAB — HEMOCCULT GUIAC POC 1CARD (OFFICE): Fecal Occult Blood, POC: NEGATIVE

## 2023-06-10 NOTE — Progress Notes (Signed)
Patient ID: Jacqueline Leon, female   DOB: [birth year], 67 y.o.   MRN: [id number] History of Present Illness: Jacqueline Leon is a 67 year old black female, married, sp hysterectomy for cervical cancer in for a well woman gyn exam and pap. She fractured her right ankle in March of this year.   PCP is Dr Phillips Odor   Current Medications, Allergies, Past Medical History, Past Surgical History, Family History and Social History were reviewed in [employer] electronic medical record.     Review of Systems: Patient denies any headaches, hearing loss, fatigue, blurred vision, shortness of breath, chest pain, abdominal pain, problems with bowel movements, urination, or intercourse. No joint pain or mood swings.     Physical Exam:BP 138/75 (BP Location: Left Arm, Patient Position: Sitting, Cuff Size: Normal)   Pulse 66   Ht 5\' 4"  (1.626 m)   Wt 180 lb 8 oz (81.9 kg)   BMI 30.98 kg/m   General:  Well developed, well nourished, no acute distress Skin:  Warm and dry Neck:  Midline trachea, normal thyroid, good ROM, no lymphadenopathy,no carotid bruits heard Lungs; Clear to auscultation bilaterally Breast:  No dominant palpable mass, retraction, or nipple discharge Cardiovascular: Regular rate and rhythm Abdomen:  Soft, non tender, no hepatosplenomegaly Pelvic:  External genitalia is normal in appearance, no lesions.  The vagina is normal in appearance, no lesions seen. Urethra has no lesions or masses. The cervix and uterus are absent, vaginal pap with HR HPV genotyping performed.  No adnexal masses or tenderness noted.Bladder is non tender, no masses felt. Rectal: Good sphincter tone, no polyps, or hemorrhoids felt.  Hemoccult negative. Extremities/musculoskeletal:  No varicosities noted, no clubbing or cyanosis, mild swelling right ankle Psych:  No mood changes, alert and cooperative,seems happy AA is 0 Fall risk is low    06/10/2023   11:39 AM 03/03/2022   11:44 AM 10/21/2020    2:10 PM  Depression  screen PHQ 2/9  Decreased Interest 0 0 0  Down, Depressed, Hopeless 0 0 0  PHQ - 2 Score 0 0 0  Altered sleeping 0 0 0  Tired, decreased energy 0 0 0  Change in appetite 0 0 0  Feeling bad or failure about yourself  0 0 0  Trouble concentrating 0 0 0  Moving slowly or fidgety/restless 0 0 0  Suicidal thoughts 0 0 0  PHQ-9 Score 0 0 0       06/10/2023   11:39 AM 03/03/2022   11:44 AM 10/21/2020    2:17 PM  GAD 7 : Generalized Anxiety Score  Nervous, Anxious, on Edge 0 0 0  Control/stop worrying 0 0 0  Worry too much - different things 0 0 0  Trouble relaxing 0 0 0  Restless 0 0 0  Easily annoyed or irritable 0 0 0  Afraid - awful might happen 0 0 0  Total GAD 7 Score 0 0 0      Upstream - 06/10/23 1134       Pregnancy Intention Screening   Does the patient want to become pregnant in the next year? N/A    Does the patient's partner want to become pregnant in the next year? N/A    Would the patient like to discuss contraceptive options today? N/A      Contraception Wrap Up   Current Method Female Sterilization   hyst   End Method Female Sterilization   hyst   Contraception Counseling Provided No  Examination chaperoned by Malachy Mood LPN   Impression and plan:  1. Encounter for well woman exam with routine gynecological exam Physical in 1 year  Mammogram tomorrow Labs with PCP Colonoscopy per GI   2. S/P hysterectomy 1988 for cervical cancer   3. History of cervical cancer  4. Vaginal Pap smear following hysterectomy for malignancy Pap sent with HR HPV  Can stop paps   5. Encounter for screening fecal occult blood testing Hemoccult was negative

## 2023-06-10 NOTE — Addendum Note (Signed)
Addended by: Colen Darling on: 06/10/2023 12:20 PM   Modules accepted: Orders

## 2023-06-11 ENCOUNTER — Encounter (HOSPITAL_COMMUNITY): Payer: Self-pay

## 2023-06-11 ENCOUNTER — Ambulatory Visit (HOSPITAL_COMMUNITY)
Admission: RE | Admit: 2023-06-11 | Discharge: 2023-06-11 | Disposition: A | Discharge status: Home / Self Care | Payer: Medicare Other | Source: Ambulatory Visit | Attending: Family Medicine | Admitting: Family Medicine

## 2023-06-11 DIAGNOSIS — Z1231 Encounter for screening mammogram for malignant neoplasm of breast: Secondary | ICD-10-CM | POA: Diagnosis not present

## 2023-06-15 LAB — CYTOLOGY - PAP
Comment: NEGATIVE
Diagnosis: NEGATIVE
Diagnosis: REACTIVE
High risk HPV: NEGATIVE

## 2023-06-23 DIAGNOSIS — L409 Psoriasis, unspecified: Secondary | ICD-10-CM | POA: Diagnosis not present

## 2023-06-23 DIAGNOSIS — E7849 Other hyperlipidemia: Secondary | ICD-10-CM | POA: Diagnosis not present

## 2023-06-23 DIAGNOSIS — E782 Mixed hyperlipidemia: Secondary | ICD-10-CM | POA: Diagnosis not present

## 2023-06-23 DIAGNOSIS — I639 Cerebral infarction, unspecified: Secondary | ICD-10-CM | POA: Diagnosis not present

## 2023-06-23 DIAGNOSIS — I10 Essential (primary) hypertension: Secondary | ICD-10-CM | POA: Diagnosis not present

## 2023-06-23 DIAGNOSIS — Z0001 Encounter for general adult medical examination with abnormal findings: Secondary | ICD-10-CM | POA: Diagnosis not present

## 2023-08-22 ENCOUNTER — Other Ambulatory Visit: Payer: Self-pay | Admitting: Internal Medicine

## 2023-10-26 ENCOUNTER — Ambulatory Visit: Attending: Cardiology | Admitting: Internal Medicine

## 2023-10-26 ENCOUNTER — Telehealth: Payer: Self-pay

## 2023-10-26 DIAGNOSIS — I10 Essential (primary) hypertension: Secondary | ICD-10-CM

## 2023-10-26 NOTE — Progress Notes (Signed)
 Cardiology Office Note:    Date:  10/26/2023   ID:  Seeley Southgate Hosick, DOB 09/26/1955, MRN [id number]  PCP:  Assunta Found, MD   [facility] Providers Cardiologist:  Maisie Fus, MD     Referring MD: Assunta Found, MD   No chief complaint on file. CVA  History of Present Illness:    Jacqueline Leon is a 68 y.o. female with a hx of HLD, HTN, left ankle fx s/p ORIF in 2019, TIA 2023 with negative cardiac w/u referral from Dr. Phillips Odor post CVA 10/14/2022-10/16/2022  Per the DC summary she presented to the ED on 10/14/2022 after an unwitnessed fall at home with right ankle fracture which was reduced in the ED. She notes she was unclear on what happened, but passed out.She was also found to have difficulty speaking concerning for stroke. CT head was unremarkable. Neurology recommended MRI which does show left ACA infarct.  Her echo 10/15/2022 showed normal LV/RV function. No etiology for CVA.  Planned for DAPT for 3 weeks, then plavix. A1c 5.2%. LDL 68.  In 01/2022, she was admitted with signs of a TIA. She had an MRI showing small acute infarct in the posterior left frontal lobe. Echo with bubble in 2023 is negative for shunt. She had a 30-day cardiac monitor at that time that was negative for afib.  Today, she denies angina, dyspnea on exertion, lower extremity edema, PND or orthopnea. No syncope. Was able to do >4 Mets before the fall. No report of palpitations. EKG shows PVCs.  Family Hx: maternal grandmother had a stroke. No premature CAD  Social hx: here with her husband. She denies hx of smoking  Interim 10/26/2023 Telephone Virtual Visit: She is doing well. She has had no hospitalizations.   Past Medical History:  Diagnosis Date   Abnormal Pap smear    History of cervical cancer    Hyperlipidemia    Hypertension    Inner ear inflammation    Mini stroke 01/2022   Stroke [facility]    Vaginal Pap smear, abnormal     Past Surgical History:  Procedure Laterality Date    ABDOMINAL HYSTERECTOMY     ORIF ANKLE FRACTURE Left 07/12/2017   Procedure: OPEN REDUCTION INTERNAL FIXATION (ORIF) ANKLE FRACTURE;  Surgeon: Vickki Hearing, MD;  Location: [facility];  Service: Orthopedics;  Laterality: Left;    Current Medications: Current Outpatient Medications on File Prior to Visit  Medication Sig Dispense Refill   atorvastatin (LIPITOR) 40 MG tablet Take 40 mg by mouth daily.     cholecalciferol (VITAMIN D) 1000 UNITS tablet Take 1,000 Units by mouth daily.     clopidogrel (PLAVIX) 75 MG tablet TAKE 1 TABLET BY MOUTH DAILY 100 tablet 0   hydrochlorothiazide (MICROZIDE) 12.5 MG capsule Take 1 capsule (12.5 mg total) by mouth daily. 90 capsule 4   meclizine (ANTIVERT) 25 MG tablet Take 25 mg by mouth daily as needed for dizziness.     metoprolol succinate (TOPROL XL) 25 MG 24 hr tablet Take 1 tablet (25 mg total) by mouth daily. 90 tablet 3   Multiple Vitamin (MULTIVITAMIN) tablet Take 1 tablet by mouth daily.     No current facility-administered medications on file prior to visit.     Allergies:   Patient has no known allergies.      Family History: The patient's family history includes Hypertension in her father, maternal grandfather, and mother; Kidney disease in her brother; Other in her brother, daughter, and mother.  ROS:   Please see the history of present illness.     All other systems reviewed and are negative.  EKGs/Labs/Other Studies Reviewed:    The following studies were reviewed today:   EKG:  EKG is  ordered today.  The ekg ordered today demonstrates   10/22/2022-Sinus tachycardia with PVCs  Recent Labs: No results found for requested labs within last 365 days.  Recent Lipid Panel    Component Value Date/Time   CHOL 124 10/15/2022 0437   CHOL 182 10/21/2020 1453   TRIG 34 10/15/2022 0437   HDL 49 10/15/2022 0437   HDL 68 10/21/2020 1453   CHOLHDL 2.5 10/15/2022 0437   VLDL 7 10/15/2022 0437   LDLCALC 68 10/15/2022 0437   LDLCALC  104 (H) 10/21/2020 1453     Risk Assessment/Calculations:          Physical Exam:    VS:   138/75 mmhg    Wt Readings from Last 3 Encounters:  06/10/23 180 lb 8 oz (81.9 kg)  03/30/23 181 lb (82.1 kg)  11/04/22 182 lb 1.6 oz (82.6 kg)     NA  ASSESSMENT:    Prior TIA 01/2022, CVA 10/2022: Cardiac monitor 11/13/2022 showed nrmal rhythm, minor ectopy. No Afib. She had an echo with bubble in 2023 showing no PFO. Otherwise, she can continue her medications. Continue plavix 75 mg daily. Blood pressure is well controlled. Continue lipitor, LDL is at goal.  PLAN:    In order of problems listed above:  Can follow with Dr. Royann Shivers in 1 year; he sees her husband      Telephone Virtual Visit via Video Note  I connected with Jacqueline Leon on 10/26/23 at 11:00 AM EDT by a video enabled telemedicine application and verified that I am speaking with the correct person using two identifiers.  Location: Patient: Home Provider: Home   I discussed the limitations of evaluation and management by telemedicine and the availability of in person appointments. The patient expressed understanding and agreed to proceed.  I discussed the assessment and treatment plan with the patient. The patient was provided an opportunity to ask questions and all were answered. The patient agreed with the plan and demonstrated an understanding of the instructions.   The patient was advised to call back or seek an in-person evaluation if the symptoms worsen or if the condition fails to improve as anticipated.  I provided 15 minutes of non-face-to-face time during this encounter.   Maisie Fus, MD   Medication Adjustments/Labs and Tests Ordered: Current medicines are reviewed at length with the patient today.  Concerns regarding medicines are outlined above.  No orders of the defined types were placed in this encounter.  No orders of the defined types were placed in this encounter.   There are  no Patient Instructions on file for this visit.   Signed, Maisie Fus, MD  10/26/2023 7:55 AM    [facility]

## 2023-10-26 NOTE — Patient Instructions (Signed)
 Medication Instructions:  Your physician recommends that you continue on your current medications as directed. Please refer to the Current Medication list given to you today.  *If you need a refill on your cardiac medications before your next appointment, please call your pharmacy*   Follow-Up: At [facility], you and your health needs are our priority.  As part of our continuing mission to provide you with exceptional heart care, we have created designated Provider Care Teams.  These Care Teams include your primary Cardiologist (physician) and Advanced Practice Providers (APPs -  Physician Assistants and Nurse Practitioners) who all work together to provide you with the care you need, when you need it.   Your next appointment:   12 month(s)  Provider:   Dr. Royann Shivers   Other Instructions

## 2023-10-26 NOTE — Telephone Encounter (Signed)
  Patient Consent for Virtual Visit        Jacqueline Leon has provided verbal consent on 10/26/2023 for a virtual visit (video or telephone).   CONSENT FOR VIRTUAL VISIT FOR:  Jacqueline Leon  By participating in this virtual visit I agree to the following:  I hereby voluntarily request, consent and authorize [facility] and its employed or contracted physicians, physician assistants, nurse practitioners or other licensed health care professionals (the Practitioner), to provide me with telemedicine health care services (the "Services") as deemed necessary by the treating Practitioner. I acknowledge and consent to receive the Services by the Practitioner via telemedicine. I understand that the telemedicine visit will involve communicating with the Practitioner through live audiovisual communication technology and the disclosure of certain medical information by electronic transmission. I acknowledge that I have been given the opportunity to request an in-person assessment or other available alternative prior to the telemedicine visit and am voluntarily participating in the telemedicine visit.  I understand that I have the right to withhold or withdraw my consent to the use of telemedicine in the course of my care at any time, without affecting my right to future care or treatment, and that the Practitioner or I may terminate the telemedicine visit at any time. I understand that I have the right to inspect all information obtained and/or recorded in the course of the telemedicine visit and may receive copies of available information for a reasonable fee.  I understand that some of the potential risks of receiving the Services via telemedicine include:  Delay or interruption in medical evaluation due to technological equipment failure or disruption; Information transmitted may not be sufficient (e.g. poor resolution of images) to allow for appropriate medical decision making by the Practitioner;  and/or  In rare instances, security protocols could fail, causing a breach of personal health information.  Furthermore, I acknowledge that it is my responsibility to provide information about my medical history, conditions and care that is complete and accurate to the best of my ability. I acknowledge that Practitioner's advice, recommendations, and/or decision may be based on factors not within their control, such as incomplete or inaccurate data provided by me or distortions of diagnostic images or specimens that may result from electronic transmissions. I understand that the practice of medicine is not an exact science and that Practitioner makes no warranties or guarantees regarding treatment outcomes. I acknowledge that a copy of this consent can be made available to me via my patient portal [facility] MyChart), or I can request a printed copy by calling the office of [facility].    I understand that my insurance will be billed for this visit.   I have read or had this consent read to me. I understand the contents of this consent, which adequately explains the benefits and risks of the Services being provided via telemedicine.  I have been provided ample opportunity to ask questions regarding this consent and the Services and have had my questions answered to my satisfaction. I give my informed consent for the services to be provided through the use of telemedicine in my medical care

## 2024-07-17 ENCOUNTER — Other Ambulatory Visit (HOSPITAL_COMMUNITY): Payer: Self-pay | Admitting: Family Medicine

## 2024-07-17 DIAGNOSIS — Z1231 Encounter for screening mammogram for malignant neoplasm of breast: Secondary | ICD-10-CM

## 2024-08-14 ENCOUNTER — Ambulatory Visit (HOSPITAL_COMMUNITY)
Admission: RE | Admit: 2024-08-14 | Discharge: 2024-08-14 | Disposition: A | Discharge status: Home / Self Care | Source: Ambulatory Visit | Attending: Family Medicine | Admitting: Family Medicine

## 2024-08-14 DIAGNOSIS — Z1231 Encounter for screening mammogram for malignant neoplasm of breast: Secondary | ICD-10-CM | POA: Diagnosis present

## 2024-08-21 ENCOUNTER — Other Ambulatory Visit (HOSPITAL_COMMUNITY)
Admission: RE | Admit: 2024-08-21 | Discharge: 2024-08-21 | Disposition: A | Source: Ambulatory Visit | Attending: Adult Health | Admitting: Adult Health

## 2024-08-21 ENCOUNTER — Ambulatory Visit: Admitting: Adult Health

## 2024-08-21 ENCOUNTER — Ambulatory Visit (HOSPITAL_COMMUNITY)

## 2024-08-21 ENCOUNTER — Encounter: Payer: Self-pay | Admitting: Adult Health

## 2024-08-21 VITALS — BP 145/74 | HR 70 | Ht 64.0 in | Wt 186.0 lb

## 2024-08-21 DIAGNOSIS — Z1331 Encounter for screening for depression: Secondary | ICD-10-CM | POA: Diagnosis not present

## 2024-08-21 DIAGNOSIS — Z90710 Acquired absence of both cervix and uterus: Secondary | ICD-10-CM | POA: Diagnosis present

## 2024-08-21 DIAGNOSIS — Z01419 Encounter for gynecological examination (general) (routine) without abnormal findings: Secondary | ICD-10-CM | POA: Diagnosis present

## 2024-08-21 DIAGNOSIS — Z8541 Personal history of malignant neoplasm of cervix uteri: Secondary | ICD-10-CM

## 2024-08-21 DIAGNOSIS — I10 Essential (primary) hypertension: Secondary | ICD-10-CM | POA: Diagnosis not present

## 2024-08-21 DIAGNOSIS — Z08 Encounter for follow-up examination after completed treatment for malignant neoplasm: Secondary | ICD-10-CM | POA: Diagnosis present

## 2024-08-21 DIAGNOSIS — Z1272 Encounter for screening for malignant neoplasm of vagina: Secondary | ICD-10-CM | POA: Diagnosis not present

## 2024-08-21 NOTE — Addendum Note (Signed)
 Addended by: NEYSA CLARITA RAMAN on: 08/21/2024 11:23 AM   Modules accepted: Orders

## 2024-08-21 NOTE — Progress Notes (Signed)
 Patient ID: Jacqueline Leon, female   DOB: 03/25/1956, 69 y.o.   MRN: [id number] History of Present Illness: Jacqueline Leon is a 69 year old black female, married, sp hysterectomy for cervical cancer, in 1988, in for well woman gyn exam and she wants a pap.  PCP is Dr Marvine   Current Medications, Allergies, Past Medical History, Past Surgical History, Family History and Social History were reviewed in [employer] electronic medical record.     Review of Systems: Patient denies any headaches, hearing loss, fatigue, blurred vision, shortness of breath, chest pain, abdominal pain, problems with bowel movements, urination, or intercourse. No joint pain or mood swings.  Denies any spotting   Physical Exam:BP (!) 145/74 (BP Location: Left Arm, Patient Position: Sitting, Cuff Size: Normal)   Pulse 70   Ht 5' 4 (1.626 m)   Wt 186 lb (84.4 kg)   BMI 31.93 kg/m   General:  Well developed, well nourished, no acute distress Skin:  Warm and dry Neck:  Midline trachea, normal thyroid , good ROM, no lymphadenopathy,no carotid bruits heard Lungs; Clear to auscultation bilaterally Breast:  No dominant palpable mass, retraction, or nipple discharge Cardiovascular: Regular rate and rhythm Abdomen:  Soft, non tender, no hepatosplenomegaly Pelvic:  External genitalia is normal in appearance, no lesions.  The vagina is normal in appearance. Urethra has no lesions or masses. The cervix and uterus are absent. Pap performed with HR HPV genotyping.  No adnexal masses or tenderness noted.Bladder is non tender, no masses felt. Rectal: Deferred Extremities/musculoskeletal:  No swelling or varicosities noted, no clubbing or cyanosis Psych:  No mood changes, alert and cooperative,seems happy AA is 1 Fall risk is low    08/21/2024   10:35 AM 06/10/2023   11:39 AM 03/03/2022   11:44 AM  Depression screen PHQ 2/9  Decreased Interest 0 0 0  Down, Depressed, Hopeless 0 0 0  PHQ - 2 Score 0 0 0  Altered sleeping 0 0 0   Tired, decreased energy 0 0 0  Change in appetite 0 0 0  Feeling bad or failure about yourself  0 0 0  Trouble concentrating 0 0 0  Moving slowly or fidgety/restless 0 0 0  Suicidal thoughts 0 0 0  PHQ-9 Score 0 0  0      Data saved with a previous flowsheet row definition       08/21/2024   10:36 AM 06/10/2023   11:39 AM 03/03/2022   11:44 AM 10/21/2020    2:17 PM  GAD 7 : Generalized Anxiety Score  Nervous, Anxious, on Edge 0 0 0 0  Control/stop worrying 0 0 0 0  Worry too much - different things 0 0 0 0  Trouble relaxing 0 0 0 0  Restless 0 0 0 0  Easily annoyed or irritable 0 0 0 0  Afraid - awful might happen 0 0 0 0  Total GAD 7 Score 0 0 0 0    Upstream - 08/21/24 1029       Pregnancy Intention Screening   Does the patient want to become pregnant in the next year? N/A    Does the patient's partner want to become pregnant in the next year? N/A    Would the patient like to discuss contraceptive options today? N/A      Contraception Wrap Up   Current Method Hysterectomy    End Method Hysterectomy    Contraception Counseling Provided No         Examination  chaperoned by Clarita Salt LPN    Impression and plan: 1. Encounter for well woman exam with routine gynecological exam (Primary) Physical in 1 year Labs with PCP Mammogram was negative 08/14/24 Colonoscopy was at [facility] in 2023  2. S/P hysterectomy Had in 1988   3. History of cervical cancer Pap sent   4. Essential hypertension Take BP meds, usually takes at lunch and follow up with PCP  5. Vaginal Pap smear following hysterectomy for malignancy No more paps needed since it has been over 25 years, unless she really wants

## 2024-08-22 LAB — CYTOLOGY - PAP
Comment: NEGATIVE
Diagnosis: NEGATIVE
High risk HPV: NEGATIVE

## 2024-08-23 ENCOUNTER — Ambulatory Visit: Payer: Self-pay | Admitting: Adult Health
